# Patient Record
Sex: Male | Born: 1950
Health system: Southern US, Community
[De-identification: ages and names within clinical notes are randomized; demographics above are authoritative.]

## PROBLEM LIST (undated history)

## (undated) DIAGNOSIS — E785 Hyperlipidemia, unspecified: Secondary | ICD-10-CM

## (undated) DIAGNOSIS — T7840XA Allergy, unspecified, initial encounter: Secondary | ICD-10-CM

## (undated) DIAGNOSIS — R519 Headache, unspecified: Secondary | ICD-10-CM

## (undated) HISTORY — DX: Headache, unspecified: R51.9

## (undated) HISTORY — PX: BLADDER SURGERY: SHX569

## (undated) HISTORY — PX: OTHER SURGICAL HISTORY: SHX169

## (undated) HISTORY — DX: Allergy, unspecified, initial encounter: T78.40XA

## (undated) HISTORY — PX: NASAL FRACTURE SURGERY: SHX718

## (undated) HISTORY — DX: Hyperlipidemia, unspecified: E78.5

---

## 2019-01-18 ENCOUNTER — Encounter: Payer: Self-pay | Admitting: Family Medicine

## 2019-01-18 ENCOUNTER — Other Ambulatory Visit: Payer: Self-pay

## 2019-01-18 ENCOUNTER — Ambulatory Visit (INDEPENDENT_AMBULATORY_CARE_PROVIDER_SITE_OTHER): Payer: Medicare Other | Admitting: Family Medicine

## 2019-01-18 VITALS — BP 122/80 | HR 67 | Temp 98.1°F | Ht 70.0 in | Wt 194.2 lb

## 2019-01-18 DIAGNOSIS — R519 Headache, unspecified: Secondary | ICD-10-CM | POA: Diagnosis not present

## 2019-01-18 DIAGNOSIS — R7989 Other specified abnormal findings of blood chemistry: Secondary | ICD-10-CM | POA: Diagnosis not present

## 2019-01-18 DIAGNOSIS — Z13220 Encounter for screening for lipoid disorders: Secondary | ICD-10-CM | POA: Diagnosis not present

## 2019-01-18 DIAGNOSIS — G4700 Insomnia, unspecified: Secondary | ICD-10-CM | POA: Insufficient documentation

## 2019-01-18 NOTE — Patient Instructions (Signed)
It was very nice to see you today!  We will check blood work.  We will also get a CT scan of your sinuses.  Please continue taking Claritin as needed.  Please let me know if you would like to try sleep medication in the future.  Take care, Dr Jerline Pain  Please try these tips to maintain a healthy lifestyle:   Eat at least 3 REAL meals and 1-2 snacks per day.  Aim for no more than 5 hours between eating.  If you eat breakfast, please do so within one hour of getting up.    Obtain twice as many fruits/vegetables as protein or carbohydrate foods for both lunch and dinner. (Half of each meal should be fruits/vegetables, one quarter protein, and one quarter starchy carbs)   Cut down on sweet beverages. This includes juice, soda, and sweet tea.    Exercise at least 150 minutes every week.

## 2019-01-18 NOTE — Assessment & Plan Note (Signed)
Chronic problem.  New to provider.  Stable.  Discussed behavioral modifications.  Symptoms are not particularly bothersome at this point.  He does not wish to start any other medications.  We will continue with conservative management.  We consider trial of hydroxyzine in the future if has difficulty with excessive daytime somnolence.

## 2019-01-18 NOTE — Progress Notes (Signed)
Chief Complaint:  Franklin Rivera is a 68 y.o. male who presents today with a chief complaint of headache and to establish care.   Assessment/Plan:  Frontal headache Chronic problem.  New to provider.  Stable.  No red flags.  Given history of nasal and skull fracture, will check CT maxillofacial to rule out sinus pathology.  He can continue using Claritin in the meantime.  Insomnia Chronic problem.  New to provider.  Stable.  Discussed behavioral modifications.  Symptoms are not particularly bothersome at this point.  He does not wish to start any other medications.  We will continue with conservative management.  We consider trial of hydroxyzine in the future if has difficulty with excessive daytime somnolence.  Preventative Healthcare Check lipid panel.    Subjective:  HPI:  Headaches Symptoms started about a year ago.  He thinks it is mostly due to allergies and sinus.  Seems to be located in the frontal aspect of his head.  Patient does have remote history of a skull fracture and nasal fracture when he was younger.  He notices that whenever he takes Claritin symptoms seem to improve or resolve completely.  Symptoms usually occur once per day.  Does not have any associated weakness or numbness.  Occasionally takes Tylenol or Aleve which only temporarily alleviates his pain.  No blurred vision.  No reported fevers or chills.  No nausea or vomiting.  No other treatments tried.  No other obvious aggravating or alleviating factors.  Patient is also has some difficulty sleeping and staying asleep.  This is going on for several years.  Thinks he has been like this for most of his life.  Patient states he only sleeps 4 to 5 hours per night.  This does not seem to be bothering him.  He is able to accomplish things he wants to throughout the day.  Does not feel excessively sleepy.  ROS: Per HPI, otherwise a complete review of systems was negative.   PMH:  The following were reviewed and  entered/updated in epic: Past Medical History:  Diagnosis Date  . Allergy   . Headache    Patient Active Problem List   Diagnosis Date Noted  . Frontal headache 01/18/2019  . Insomnia 01/18/2019   Past Surgical History:  Procedure Laterality Date  . BLADDER SURGERY    . NASAL FRACTURE SURGERY    . Skull Surgery      Family History  Problem Relation Age of Onset  . Cancer Neg Hx     Medications- reviewed and updated No current outpatient medications on file.   No current facility-administered medications for this visit.     Allergies-reviewed and updated No Known Allergies  Social History   Socioeconomic History  . Marital status: Married    Spouse name: Not on file  . Number of children: Not on file  . Years of education: Not on file  . Highest education level: Not on file  Occupational History  . Not on file  Social Needs  . Financial resource strain: Not on file  . Food insecurity    Worry: Not on file    Inability: Not on file  . Transportation needs    Medical: Not on file    Non-medical: Not on file  Tobacco Use  . Smoking status: Never Smoker  . Smokeless tobacco: Never Used  Substance and Sexual Activity  . Alcohol use: Never    Frequency: Never  . Drug use: Not Currently  .  Sexual activity: Not on file  Lifestyle  . Physical activity    Days per week: Not on file    Minutes per session: Not on file  . Stress: Not on file  Relationships  . Social Herbalist on phone: Not on file    Gets together: Not on file    Attends religious service: Not on file    Active member of club or organization: Not on file    Attends meetings of clubs or organizations: Not on file    Relationship status: Not on file  Other Topics Concern  . Not on file  Social History Narrative  . Not on file          Objective:  Physical Exam: BP 122/80   Pulse 67   Temp 98.1 F (36.7 C)   Ht 5\' 10"  (1.778 m)   Wt 194 lb 4 oz (88.1 kg)   SpO2 97%    BMI 27.87 kg/m   Gen: NAD, resting comfortably CV: Regular rate and rhythm with no murmurs appreciated Pulm: Normal work of breathing, clear to auscultation bilaterally with no crackles, wheezes, or rhonchi GI: Normal bowel sounds present. Soft, Nontender, Nondistended. MSK: No edema, cyanosis, or clubbing noted Skin: Warm, dry Neuro: Grossly normal, moves all extremities Psych: Normal affect and thought content      Caleb M. Jerline Pain, MD 01/18/2019 3:49 PM

## 2019-01-18 NOTE — Assessment & Plan Note (Signed)
Chronic problem.  New to provider.  Stable.  No red flags.  Given history of nasal and skull fracture, will check CT maxillofacial to rule out sinus pathology.  He can continue using Claritin in the meantime.

## 2019-01-19 LAB — COMPREHENSIVE METABOLIC PANEL
ALT: 21 U/L (ref 0–53)
AST: 23 U/L (ref 0–37)
Albumin: 4.1 g/dL (ref 3.5–5.2)
Alkaline Phosphatase: 74 U/L (ref 39–117)
BUN: 14 mg/dL (ref 6–23)
CO2: 30 mEq/L (ref 19–32)
Calcium: 9.3 mg/dL (ref 8.4–10.5)
Chloride: 102 mEq/L (ref 96–112)
Creatinine, Ser: 0.94 mg/dL (ref 0.40–1.50)
GFR: 79.91 mL/min (ref >60.00)
Glucose, Bld: 76 mg/dL (ref 70–99)
Potassium: 4.3 mEq/L (ref 3.5–5.1)
Sodium: 139 mEq/L (ref 135–145)
Total Bilirubin: 0.5 mg/dL (ref 0.2–1.2)
Total Protein: 7.5 g/dL (ref 6.0–8.3)

## 2019-01-19 LAB — CBC
HCT: 44.2 % (ref 39.0–52.0)
Hemoglobin: 14.7 g/dL (ref 13.0–17.0)
MCHC: 33.2 g/dL (ref 30.0–36.0)
MCV: 87.2 fl (ref 78.0–100.0)
Platelets: 201 10*3/uL (ref 150.0–400.0)
RBC: 5.07 Mil/uL (ref 4.22–5.81)
RDW: 13.1 % (ref 11.5–15.5)
WBC: 4.5 10*3/uL (ref 4.0–10.5)

## 2019-01-19 LAB — LIPID PANEL
Cholesterol: 225 mg/dL — ABNORMAL HIGH (ref 0–200)
HDL: 46.8 mg/dL (ref >39.00)
LDL Cholesterol: 160 mg/dL — ABNORMAL HIGH (ref 0–99)
NonHDL: 177.71
Total CHOL/HDL Ratio: 5
Triglycerides: 89 mg/dL (ref 0.0–149.0)
VLDL: 17.8 mg/dL (ref 0.0–40.0)

## 2019-01-20 ENCOUNTER — Encounter: Payer: Self-pay | Admitting: Family Medicine

## 2019-01-20 DIAGNOSIS — E785 Hyperlipidemia, unspecified: Secondary | ICD-10-CM | POA: Insufficient documentation

## 2019-01-20 NOTE — Progress Notes (Signed)
Please inform patient of the following:  Cholesterol was elevated, but everything else was NORMAL. Recommend starting lipitor 40mg  daily to improve numbers and lower risk of heart attack and stroke. Please send in if patient is willing to start. Regardless, he should continue working on diet and exercise and we can recheck in a year.   Franklin Rivera. Jerline Pain, MD 01/20/2019 10:09 AM

## 2019-01-24 ENCOUNTER — Other Ambulatory Visit: Payer: Self-pay

## 2019-01-24 MED ORDER — ATORVASTATIN CALCIUM 40 MG PO TABS: 40.0000 mg | ORAL_TABLET | Freq: Every day | ORAL | 3 refills | Status: DC

## 2019-02-04 ENCOUNTER — Ambulatory Visit (HOSPITAL_COMMUNITY)
Admission: RE | Admit: 2019-02-04 | Discharge: 2019-02-04 | Disposition: A | Discharge status: Home / Self Care | Payer: Medicare Other | Source: Ambulatory Visit | Attending: Family Medicine | Admitting: Family Medicine

## 2019-02-04 ENCOUNTER — Other Ambulatory Visit: Payer: Self-pay

## 2019-02-04 DIAGNOSIS — R519 Headache, unspecified: Secondary | ICD-10-CM | POA: Insufficient documentation

## 2019-02-07 ENCOUNTER — Other Ambulatory Visit: Payer: Self-pay

## 2019-02-07 NOTE — Progress Notes (Signed)
Please inform patient of the following:  CT scan shows that he does have congestion in his sinuses. I think this could be the source of his headaches. I would like to start prednisone 50mg  x 5 days and augmentin twice daily for 10 days. Please send both in for patient and I would like for him to let us know if symptoms do not improve.  Franklin Rivera. Jerline Pain, MD 02/07/2019 8:28 AM

## 2019-02-08 ENCOUNTER — Other Ambulatory Visit: Payer: Self-pay | Admitting: *Deleted

## 2019-02-08 MED ORDER — AMOXICILLIN-POT CLAVULANATE 875-125 MG PO TABS: 1.0000 | ORAL_TABLET | Freq: Two times a day (BID) | ORAL | 0 refills | Status: DC

## 2019-02-08 MED ORDER — PREDNISONE 50 MG PO TABS: ORAL_TABLET | ORAL | 0 refills | Status: DC

## 2019-04-20 ENCOUNTER — Other Ambulatory Visit: Payer: Self-pay

## 2019-04-21 ENCOUNTER — Telehealth: Payer: Self-pay

## 2019-04-21 ENCOUNTER — Ambulatory Visit (INDEPENDENT_AMBULATORY_CARE_PROVIDER_SITE_OTHER): Payer: Medicare HMO | Admitting: Family Medicine

## 2019-04-21 ENCOUNTER — Encounter: Payer: Self-pay | Admitting: Family Medicine

## 2019-04-21 VITALS — BP 128/80 | HR 87 | Temp 98.2°F | Ht 70.0 in | Wt 212.2 lb

## 2019-04-21 DIAGNOSIS — R519 Headache, unspecified: Secondary | ICD-10-CM | POA: Diagnosis not present

## 2019-04-21 DIAGNOSIS — J329 Chronic sinusitis, unspecified: Secondary | ICD-10-CM

## 2019-04-21 MED ORDER — PREDNISONE 50 MG PO TABS: ORAL_TABLET | ORAL | 0 refills | Status: DC

## 2019-04-21 MED ORDER — AMOXICILLIN-POT CLAVULANATE 875-125 MG PO TABS: 1.0000 | ORAL_TABLET | Freq: Two times a day (BID) | ORAL | 0 refills | Status: DC

## 2019-04-21 NOTE — Patient Instructions (Signed)
It was very nice to see you today!  Please restart your medications.  I will place a referral for you to see an ear nose and throat doctor.  Take care, Dr Jerline Pain  Please try these tips to maintain a healthy lifestyle:   Eat at least 3 REAL meals and 1-2 snacks per day.  Aim for no more than 5 hours between eating.  If you eat breakfast, please do so within one hour of getting up.    Each meal should contain half fruits/vegetables, one quarter protein, and one quarter carbs (no bigger than a computer mouse)   Cut down on sweet beverages. This includes juice, soda, and sweet tea.     Drink at least 1 glass of water with each meal and aim for at least 8 glasses per day   Exercise at least 150 minutes every week.

## 2019-04-21 NOTE — Progress Notes (Signed)
   Franklin Rivera is a 69 y.o. male who presents today for an office visit.  Assessment/Plan:  Chronic Problems Addressed Today: Chronic sinusitis Worsened.  We will start another course of Augmentin and prednisone.  Place referral to ENT.     Subjective:  HPI:  Patient with chronic sinusitis.  He was seen few months ago and had maxillofacial CT scan done which showed opacification of left sphenoid sinus.  Started on a course of prednisone and Augmentin.  Symptoms improved for a few weeks however it returned.  He has been try to take Claritin with modest improvement.  Still has nearly daily headache.  No nausea or vomiting.  No vision changes.  No weakness or numbness.       Objective:  Physical Exam: BP 128/80   Pulse 87   Temp 98.2 F (36.8 C)   Ht 5\' 10"  (1.778 m)   Wt 212 lb 3.2 oz (96.3 kg)   SpO2 98%   BMI 30.45 kg/m   Gen: No acute distress, resting comfortably CV: Regular rate and rhythm with no murmurs appreciated Pulm: Normal work of breathing, clear to auscultation bilaterally with no crackles, wheezes, or rhonchi Neuro: Grossly normal, moves all extremities Psych: Normal affect and thought content      Tomi Grandpre M. Jerline Pain, MD 04/21/2019 11:10 AM

## 2019-04-21 NOTE — Assessment & Plan Note (Signed)
Worsened.  We will start another course of Augmentin and prednisone.  Place referral to ENT.

## 2019-04-21 NOTE — Telephone Encounter (Signed)
Patient calling to see if medication was sent in   [facility] [place] [address] Phone:  [phone]  Fax:  [phone]     for amoxicillin-clavulanate (AUGMENTIN) 875-125 MG tablet

## 2019-04-22 DIAGNOSIS — H1045 Other chronic allergic conjunctivitis: Secondary | ICD-10-CM | POA: Diagnosis not present

## 2019-04-22 DIAGNOSIS — H0288B Meibomian gland dysfunction left eye, upper and lower eyelids: Secondary | ICD-10-CM | POA: Diagnosis not present

## 2019-04-22 DIAGNOSIS — H02423 Myogenic ptosis of bilateral eyelids: Secondary | ICD-10-CM | POA: Diagnosis not present

## 2019-04-22 DIAGNOSIS — H3562 Retinal hemorrhage, left eye: Secondary | ICD-10-CM | POA: Diagnosis not present

## 2019-04-22 DIAGNOSIS — H2513 Age-related nuclear cataract, bilateral: Secondary | ICD-10-CM | POA: Diagnosis not present

## 2019-04-22 DIAGNOSIS — H0288A Meibomian gland dysfunction right eye, upper and lower eyelids: Secondary | ICD-10-CM | POA: Diagnosis not present

## 2019-05-03 ENCOUNTER — Ambulatory Visit (INDEPENDENT_AMBULATORY_CARE_PROVIDER_SITE_OTHER): Payer: Medicare HMO | Admitting: Otolaryngology

## 2019-05-03 ENCOUNTER — Other Ambulatory Visit: Payer: Self-pay

## 2019-05-03 ENCOUNTER — Encounter (INDEPENDENT_AMBULATORY_CARE_PROVIDER_SITE_OTHER): Payer: Self-pay | Admitting: Otolaryngology

## 2019-05-03 VITALS — Temp 97.5°F

## 2019-05-03 DIAGNOSIS — R519 Headache, unspecified: Secondary | ICD-10-CM

## 2019-05-03 DIAGNOSIS — G8929 Other chronic pain: Secondary | ICD-10-CM | POA: Diagnosis not present

## 2019-05-03 DIAGNOSIS — J323 Chronic sphenoidal sinusitis: Secondary | ICD-10-CM | POA: Diagnosis not present

## 2019-05-03 NOTE — Progress Notes (Addendum)
HPI: Franklin Rivera is a 69 y.o. male who presents is referred by Dr. Jerline Pain for evaluation of chronic headaches that he has had for years.  The headaches are generally on top of his head or in the back of his head.  Sometimes the headaches persist all day and sometimes they just come and go.  He has history of sinus problems and takes Claritin regularly.  The headaches seem to occur more often when he is off the Claritin.[name]  He had a CT scan performed of the sinuses in November of last year that demonstrated an opacified left sphenoid sinus.  I reviewed this scan and this showed a completely opacified left sphenoid sinus but no changes in the bony walls.  The remaining sinuses were all clear.  He had a moderate septal deviation. He is having no colored discharge from his nose. He has previously been treated with antibiotics and steroids which seemed to help a little bit.  Past Medical History:  Diagnosis Date  . Allergy   . Headache    Past Surgical History:  Procedure Laterality Date  . BLADDER SURGERY    . NASAL FRACTURE SURGERY    . Skull Surgery     Social History   Socioeconomic History  . Marital status: Married    Spouse name: Not on file  . Number of children: Not on file  . Years of education: Not on file  . Highest education level: Not on file  Occupational History  . Not on file  Tobacco Use  . Smoking status: Never Smoker  . Smokeless tobacco: Never Used  Substance and Sexual Activity  . Alcohol use: Never  . Drug use: Not Currently  . Sexual activity: Not on file  Other Topics Concern  . Not on file  Social History Narrative  . Not on file   Social Determinants of Health   Financial Resource Strain:   . Difficulty of Paying Living Expenses: Not on file  Food Insecurity:   . Worried About Charity fundraiser in the Last Year: Not on file  . Ran Out of Food in the Last Year: Not on file  Transportation Needs:   . Lack of Transportation (Medical): Not on file   . Lack of Transportation (Non-Medical): Not on file  Physical Activity:   . Days of Exercise per Week: Not on file  . Minutes of Exercise per Session: Not on file  Stress:   . Feeling of Stress : Not on file  Social Connections:   . Frequency of Communication with Friends and Family: Not on file  . Frequency of Social Gatherings with Friends and Family: Not on file  . Attends Religious Services: Not on file  . Active Member of Clubs or Organizations: Not on file  . Attends Archivist Meetings: Not on file  . Marital Status: Not on file   Family History  Problem Relation Age of Onset  . Cancer Neg Hx    No Known Allergies Prior to Admission medications   Medication Sig Start Date End Date Taking? Authorizing Provider  amoxicillin-clavulanate (AUGMENTIN) 875-125 MG tablet Take 1 tablet by mouth 2 (two) times daily. 04/21/19  Yes [name], MD  atorvastatin (LIPITOR) 40 MG tablet Take 1 tablet (40 mg total) by mouth daily. 01/24/19  Yes [name], MD  predniSONE (DELTASONE) 50 MG tablet One tablet daily x 5 days. 04/21/19  Yes [name], MD     Positive ROS:  Otherwise negative  All other systems have been reviewed and were otherwise negative with the exception of those mentioned in the HPI and as above.  Physical Exam: Constitutional: Alert, well-appearing, no acute distress Ears: External ears without lesions or tenderness. Ear canals are clear bilaterally with intact, clear TMs.  Nasal: External nose without lesions. Septum is mildly deviated.  No obvious mucopurulent discharge noted.. Nasal endoscopy was performed and on nasal endoscopy the right sphenoid ostia is widely patent.  The left sphenoid ostia is occluded but I do not appreciate any mucopurulent discharge from the sphenoid ostia. Oral: Lips and gums without lesions. Tongue and palate mucosa without lesions. Posterior oropharynx clear. Neck: No palpable adenopathy or masses Respiratory:  Breathing comfortably  Skin: No facial/neck lesions or rash noted.  Nasal/sinus endoscopy  Date/Time: 05/03/2019 6:22 PM Performed by: Rozetta Nunnery, MD Authorized by: Rozetta Nunnery, MD   Consent:    Consent obtained:  Verbal   Consent given by:  Patient   Risks discussed:  Pain Procedure details:    Indications: sino-nasal symptoms     Medication:  Afrin   Scope location: bilateral nare   Sinus:    Right middle meatus: normal     Left middle meatus: normal     Right nasopharynx: normal     Left nasopharynx: normal   Comments:     On nasal endoscopy of the sphenoid ostia the right sphenoid ostia is widely patent and clear.  The left sphenoid ostia is obstructed but I do not notice any obvious mucopurulent discharge from the sphenoid ostia.    Assessment: Chronic left sphenoid obstruction. Chronic headaches for years.  Plan: I will plan on treating him with antibiotics Augmentin which he is taken previously 875 mg twice daily for 2 weeks in addition to regular use of nasal steroid spray Nasacort 2 sprays each nostril at night. If he is continuing to have headaches in 3-4 weeks he will call us back to schedule repeat CT scan to see if the opacity of left sphenoid sinus persists.  If it persists could consider opening the sphenoid sinus surgically which should not be too difficult to see if this resolves the headaches.   Radene Journey, MD   CC:

## 2019-05-20 ENCOUNTER — Other Ambulatory Visit: Payer: Self-pay

## 2019-05-23 ENCOUNTER — Encounter: Payer: Self-pay | Admitting: Family Medicine

## 2019-05-23 ENCOUNTER — Ambulatory Visit (INDEPENDENT_AMBULATORY_CARE_PROVIDER_SITE_OTHER): Payer: Medicare HMO | Admitting: Family Medicine

## 2019-05-23 ENCOUNTER — Ambulatory Visit: Payer: Medicare HMO

## 2019-05-23 ENCOUNTER — Other Ambulatory Visit: Payer: Self-pay

## 2019-05-23 VITALS — BP 118/72 | HR 77 | Temp 97.3°F | Ht 72.0 in | Wt 209.0 lb

## 2019-05-23 DIAGNOSIS — Z1159 Encounter for screening for other viral diseases: Secondary | ICD-10-CM

## 2019-05-23 DIAGNOSIS — Z0001 Encounter for general adult medical examination with abnormal findings: Secondary | ICD-10-CM

## 2019-05-23 DIAGNOSIS — Z6828 Body mass index (BMI) 28.0-28.9, adult: Secondary | ICD-10-CM

## 2019-05-23 DIAGNOSIS — E663 Overweight: Secondary | ICD-10-CM

## 2019-05-23 DIAGNOSIS — N529 Male erectile dysfunction, unspecified: Secondary | ICD-10-CM

## 2019-05-23 DIAGNOSIS — G4700 Insomnia, unspecified: Secondary | ICD-10-CM | POA: Diagnosis not present

## 2019-05-23 DIAGNOSIS — Z125 Encounter for screening for malignant neoplasm of prostate: Secondary | ICD-10-CM

## 2019-05-23 DIAGNOSIS — Z1211 Encounter for screening for malignant neoplasm of colon: Secondary | ICD-10-CM | POA: Diagnosis not present

## 2019-05-23 DIAGNOSIS — E785 Hyperlipidemia, unspecified: Secondary | ICD-10-CM | POA: Diagnosis not present

## 2019-05-23 DIAGNOSIS — J329 Chronic sinusitis, unspecified: Secondary | ICD-10-CM | POA: Diagnosis not present

## 2019-05-23 LAB — LIPID PANEL
Cholesterol: 177 mg/dL (ref 0–200)
HDL: 41.7 mg/dL (ref >39.00)
LDL Cholesterol: 103 mg/dL — ABNORMAL HIGH (ref 0–99)
NonHDL: 135.18
Total CHOL/HDL Ratio: 4
Triglycerides: 160 mg/dL — ABNORMAL HIGH (ref 0.0–149.0)
VLDL: 32 mg/dL (ref 0.0–40.0)

## 2019-05-23 LAB — CBC
HCT: 47.3 % (ref 39.0–52.0)
Hemoglobin: 15.6 g/dL (ref 13.0–17.0)
MCHC: 32.9 g/dL (ref 30.0–36.0)
MCV: 87.6 fl (ref 78.0–100.0)
Platelets: 163 10*3/uL (ref 150.0–400.0)
RBC: 5.4 Mil/uL (ref 4.22–5.81)
RDW: 13.1 % (ref 11.5–15.5)
WBC: 3.3 10*3/uL — ABNORMAL LOW (ref 4.0–10.5)

## 2019-05-23 LAB — COMPREHENSIVE METABOLIC PANEL
ALT: 43 U/L (ref 0–53)
AST: 33 U/L (ref 0–37)
Albumin: 4.2 g/dL (ref 3.5–5.2)
Alkaline Phosphatase: 88 U/L (ref 39–117)
BUN: 11 mg/dL (ref 6–23)
CO2: 27 mEq/L (ref 19–32)
Calcium: 9.5 mg/dL (ref 8.4–10.5)
Chloride: 103 mEq/L (ref 96–112)
Creatinine, Ser: 0.82 mg/dL (ref 0.40–1.50)
GFR: 113.08 mL/min (ref >60.00)
Glucose, Bld: 108 mg/dL — ABNORMAL HIGH (ref 70–99)
Potassium: 4.4 mEq/L (ref 3.5–5.1)
Sodium: 141 mEq/L (ref 135–145)
Total Bilirubin: 0.4 mg/dL (ref 0.2–1.2)
Total Protein: 7.7 g/dL (ref 6.0–8.3)

## 2019-05-23 LAB — PSA: PSA: 1.04 ng/mL (ref 0.10–4.00)

## 2019-05-23 LAB — TSH: TSH: 0.7 u[IU]/mL (ref 0.35–4.50)

## 2019-05-23 MED ORDER — TADALAFIL 10 MG PO TABS: 10.0000 mg | ORAL_TABLET | Freq: Every day | ORAL | 0 refills | Status: DC | PRN

## 2019-05-23 MED ORDER — ATORVASTATIN CALCIUM 40 MG PO TABS: 40.0000 mg | ORAL_TABLET | Freq: Every day | ORAL | 3 refills | Status: DC

## 2019-05-23 NOTE — Assessment & Plan Note (Signed)
Continue management per ENT. 

## 2019-05-23 NOTE — Progress Notes (Signed)
Chief Complaint:  Franklin Rivera is a 69 y.o. male who presents today for his annual comprehensive physical exam.    Assessment/Plan:  Chronic Problems Addressed Today: Erectile dysfunction Start Cialis 10 mg daily as needed.  Discussed potential side effects.  Chronic sinusitis Continue management per ENT.  Dyslipidemia Continue atorvastatin 40 mg daily.  Will check lipid panel, CBC, C met, TSH today.  Insomnia Symptoms are stable.  Does not want pharmacologic therapy at this point.  He is managing by napping during the day.   Body mass index is 28.35 kg/m. / Overweight BMI Metric Follow Up - 05/23/19 0910      BMI Metric Follow Up-Please document annually   BMI Metric Follow Up  Education provided        Preventative Healthcare: Check CBC, C met, TSH, lipid panel, PSA, and hep C screen.  We will set up for Cologuard testing.  Patient Counseling(The following topics were reviewed and/or handout was given):  -Nutrition: Stressed importance of moderation in sodium/caffeine intake, saturated fat and cholesterol, caloric balance, sufficient intake of fresh fruits, vegetables, and fiber.  -Stressed the importance of regular exercise.   -Substance Abuse: Discussed cessation/primary prevention of tobacco, alcohol, or other drug use; driving or other dangerous activities under the influence; availability of treatment for abuse.   -Injury prevention: Discussed safety belts, safety helmets, smoke detector, smoking near bedding or upholstery.   -Sexuality: Discussed sexually transmitted diseases, partner selection, use of condoms, avoidance of unintended pregnancy and contraceptive alternatives.   -Dental health: Discussed importance of regular tooth brushing, flossing, and dental visits.  -Health maintenance and immunizations reviewed. Please refer to Health maintenance section.  Return to care in 1 year for next preventative visit.     Subjective:  HPI:  He has no acute  complaints today.   Some erectile issues for several years. Getting worse. Nothing tried.   Still having ongoing issues with difficulty sleeping but they are not particularly bothersome and he manages by napping during the day.    Lifestyle Diet: Balanced. Plenty of fruits and vegetables.  Exercise: Daily.   Depression screen [facility] 2/9 05/23/2019  Decreased Interest 0  Down, Depressed, Hopeless 0  PHQ - 2 Score 0  Altered sleeping -  Tired, decreased energy -  Change in appetite -  Feeling bad or failure about yourself  -  Trouble concentrating -  Moving slowly or fidgety/restless -  Suicidal thoughts -  PHQ-9 Score -  Difficult doing work/chores -    Health Maintenance Due  Topic Date Due  . Hepatitis C Screening  11-19-1950     ROS: Per HPI, otherwise a complete review of systems was negative.   PMH:  The following were reviewed and entered/updated in epic: Past Medical History:  Diagnosis Date  . Allergy   . Headache    Patient Active Problem List   Diagnosis Date Noted  . Erectile dysfunction 05/23/2019  . Chronic sinusitis 04/21/2019  . Dyslipidemia 01/20/2019  . Frontal headache 01/18/2019  . Insomnia 01/18/2019   Past Surgical History:  Procedure Laterality Date  . BLADDER SURGERY    . NASAL FRACTURE SURGERY    . Skull Surgery      Family History  Problem Relation Age of Onset  . Cancer Neg Hx     Medications- reviewed and updated Current Outpatient Medications  Medication Sig Dispense Refill  . atorvastatin (LIPITOR) 40 MG tablet Take 1 tablet (40 mg total) by mouth daily. 90 tablet 3  .  tadalafil (CIALIS) 10 MG tablet Take 1 tablet (10 mg total) by mouth daily as needed for erectile dysfunction. 30 tablet 0   No current facility-administered medications for this visit.    Allergies-reviewed and updated No Known Allergies  Social History   Socioeconomic History  . Marital status: Married    Spouse name: Not on file  . Number of  children: Not on file  . Years of education: Not on file  . Highest education level: Not on file  Occupational History  . Not on file  Tobacco Use  . Smoking status: Never Smoker  . Smokeless tobacco: Never Used  Substance and Sexual Activity  . Alcohol use: Never  . Drug use: Not Currently  . Sexual activity: Not on file  Other Topics Concern  . Not on file  Social History Narrative  . Not on file   Social Determinants of Health   Financial Resource Strain:   . Difficulty of Paying Living Expenses: Not on file  Food Insecurity:   . Worried About Charity fundraiser in the Last Year: Not on file  . Ran Out of Food in the Last Year: Not on file  Transportation Needs:   . Lack of Transportation (Medical): Not on file  . Lack of Transportation (Non-Medical): Not on file  Physical Activity:   . Days of Exercise per Week: Not on file  . Minutes of Exercise per Session: Not on file  Stress:   . Feeling of Stress : Not on file  Social Connections:   . Frequency of Communication with Friends and Family: Not on file  . Frequency of Social Gatherings with Friends and Family: Not on file  . Attends Religious Services: Not on file  . Active Member of Clubs or Organizations: Not on file  . Attends Archivist Meetings: Not on file  . Marital Status: Not on file        Objective:  Physical Exam: BP 118/72   Pulse 77   Temp (!) 97.3 F (36.3 C)   Ht 6' (1.829 m)   Wt 209 lb (94.8 kg)   SpO2 95%   BMI 28.35 kg/m   Body mass index is 28.35 kg/m. Wt Readings from Last 3 Encounters:  05/23/19 209 lb (94.8 kg)  04/21/19 212 lb 3.2 oz (96.3 kg)  01/18/19 194 lb 4 oz (88.1 kg)   Gen: NAD, resting comfortably HEENT: TMs normal bilaterally. OP clear. No thyromegaly noted.  CV: RRR with no murmurs appreciated Pulm: NWOB, CTAB with no crackles, wheezes, or rhonchi GI: Normal bowel sounds present. Soft, Nontender, Nondistended. MSK: no edema, cyanosis, or clubbing  noted Skin: warm, dry Neuro: CN2-12 grossly intact. Strength 5/5 in upper and lower extremities. Reflexes symmetric and intact bilaterally.  Psych: Normal affect and thought content     Toribio Seiber M. Jerline Pain, MD 05/23/2019 9:11 AM

## 2019-05-23 NOTE — Patient Instructions (Signed)
It was very nice to see you today!  We will check blood work today.  We will get you set up to have Cologuard done to screen for colon cancer.  Come back in 1 year for your next checkup with blood work, or sooner if needed.  Take care, Dr Jerline Pain  Please try these tips to maintain a healthy lifestyle:   Eat at least 3 REAL meals and 1-2 snacks per day.  Aim for no more than 5 hours between eating.  If you eat breakfast, please do so within one hour of getting up.    Each meal should contain half fruits/vegetables, one quarter protein, and one quarter carbs (no bigger than a computer mouse)   Cut down on sweet beverages. This includes juice, soda, and sweet tea.     Drink at least 1 glass of water with each meal and aim for at least 8 glasses per day   Exercise at least 150 minutes every week.    Preventive Care 46 Years and Older, Male Preventive care refers to lifestyle choices and visits with your health care provider that can promote health and wellness. This includes:  A yearly physical exam. This is also called an annual well check.  Regular dental and eye exams.  Immunizations.  Screening for certain conditions.  Healthy lifestyle choices, such as diet and exercise. What can I expect for my preventive care visit? Physical exam Your health care provider will check:  Height and weight. These may be used to calculate body mass index (BMI), which is a measurement that tells if you are at a healthy weight.  Heart rate and blood pressure.  Your skin for abnormal spots. Counseling Your health care provider may ask you questions about:  Alcohol, tobacco, and drug use.  Emotional well-being.  Home and relationship well-being.  Sexual activity.  Eating habits.  History of falls.  Memory and ability to understand (cognition).  Work and work Statistician. What immunizations do I need?  Influenza (flu) vaccine  This is recommended every year. Tetanus,  diphtheria, and pertussis (Tdap) vaccine  You may need a Td booster every 10 years. Varicella (chickenpox) vaccine  You may need this vaccine if you have not already been vaccinated. Zoster (shingles) vaccine  You may need this after age 24. Pneumococcal conjugate (PCV13) vaccine  One dose is recommended after age 70. Pneumococcal polysaccharide (PPSV23) vaccine  One dose is recommended after age 76. Measles, mumps, and rubella (MMR) vaccine  You may need at least one dose of MMR if you were born in 1957 or later. You may also need a second dose. Meningococcal conjugate (MenACWY) vaccine  You may need this if you have certain conditions. Hepatitis A vaccine  You may need this if you have certain conditions or if you travel or work in places where you may be exposed to hepatitis A. Hepatitis B vaccine  You may need this if you have certain conditions or if you travel or work in places where you may be exposed to hepatitis B. Haemophilus influenzae type b (Hib) vaccine  You may need this if you have certain conditions. You may receive vaccines as individual doses or as more than one vaccine together in one shot (combination vaccines). Talk with your health care provider about the risks and benefits of combination vaccines. What tests do I need? Blood tests  Lipid and cholesterol levels. These may be checked every 5 years, or more frequently depending on your overall health.  Hepatitis  C test.  Hepatitis B test. Screening  Lung cancer screening. You may have this screening every year starting at age 68 if you have a 30-pack-year history of smoking and currently smoke or have quit within the past 15 years.  Colorectal cancer screening. All adults should have this screening starting at age 71 and continuing until age 57. Your health care provider may recommend screening at age 23 if you are at increased risk. You will have tests every 1-10 years, depending on your results and  the type of screening test.  Prostate cancer screening. Recommendations will vary depending on your family history and other risks.  Diabetes screening. This is done by checking your blood sugar (glucose) after you have not eaten for a while (fasting). You may have this done every 1-3 years.  Abdominal aortic aneurysm (AAA) screening. You may need this if you are a current or former smoker.  Sexually transmitted disease (STD) testing. Follow these instructions at home: Eating and drinking  Eat a diet that includes fresh fruits and vegetables, whole grains, lean protein, and low-fat dairy products. Limit your intake of foods with high amounts of sugar, saturated fats, and salt.  Take vitamin and mineral supplements as recommended by your health care provider.  Do not drink alcohol if your health care provider tells you not to drink.  If you drink alcohol: ? Limit how much you have to 0-2 drinks a day. ? Be aware of how much alcohol is in your drink. In the [place] one drink equals one 12 oz bottle of beer (355 mL), one 5 oz glass of wine (148 mL), or one 1 oz glass of hard liquor (44 mL). Lifestyle  Take daily care of your teeth and gums.  Stay active. Exercise for at least 30 minutes on 5 or more days each week.  Do not use any products that contain nicotine or tobacco, such as cigarettes, e-cigarettes, and chewing tobacco. If you need help quitting, ask your health care provider.  If you are sexually active, practice safe sex. Use a condom or other form of protection to prevent STIs (sexually transmitted infections).  Talk with your health care provider about taking a low-dose aspirin or statin. What's next?  Visit your health care provider once a year for a well check visit.  Ask your health care provider how often you should have your eyes and teeth checked.  Stay up to date on all vaccines. This information is not intended to replace advice given to you by your health care  provider. Make sure you discuss any questions you have with your health care provider. Document Revised: 03/11/2018 Document Reviewed: 03/11/2018 Elsevier Patient Education  2020 [employer].

## 2019-05-23 NOTE — Assessment & Plan Note (Signed)
Symptoms are stable.  Does not want pharmacologic therapy at this point.  He is managing by napping during the day.

## 2019-05-23 NOTE — Assessment & Plan Note (Signed)
Continue atorvastatin 40 mg daily.  Will check lipid panel, CBC, C met, TSH today.

## 2019-05-23 NOTE — Assessment & Plan Note (Signed)
Start Cialis 10 mg daily as needed.  Discussed potential side effects.

## 2019-05-24 ENCOUNTER — Encounter: Payer: Self-pay | Admitting: Family Medicine

## 2019-05-24 DIAGNOSIS — R739 Hyperglycemia, unspecified: Secondary | ICD-10-CM | POA: Insufficient documentation

## 2019-05-24 NOTE — Progress Notes (Signed)
Please inform patient of the following:  Hepatitis C antibody is positive - this means he has been exposed to the virus at some point. We will be checking another level to see if he has any active infection.   Blood sugar mildly elevated but do not need to start meds. Cholesterol much better than last year. Would like for him to continue working on diet and exercise and we can recheck in a year.  All of his other blood work is NORMAL.  Team - can we call the lab to see if they are going to run a HCV RNA? If not we will need him to come into the office to have it done.

## 2019-05-29 LAB — HCV RNA,QUANTITATIVE REAL TIME PCR
HCV Quantitative Log: 7.12 Log IU/mL — ABNORMAL HIGH
HCV RNA, PCR, QN: 13200000 IU/mL — ABNORMAL HIGH

## 2019-05-29 LAB — HEPATITIS C ANTIBODY
Hepatitis C Ab: REACTIVE — AB
SIGNAL TO CUT-OFF: 29 — ABNORMAL HIGH (ref <1.00)

## 2019-05-30 ENCOUNTER — Other Ambulatory Visit: Payer: Self-pay

## 2019-05-30 DIAGNOSIS — B1920 Unspecified viral hepatitis C without hepatic coma: Secondary | ICD-10-CM

## 2019-05-30 NOTE — Progress Notes (Signed)
Please inform patient of the following:  BLood test shows that he has an active hepatitis C infection. Recommend referral to Infectious disease so that he can start treatment. Please place referral.  Drew Lips M. Jerline Pain, MD 05/30/2019 8:09 AM

## 2019-05-30 NOTE — Progress Notes (Signed)
amb  

## 2019-06-02 ENCOUNTER — Other Ambulatory Visit: Payer: Self-pay

## 2019-06-06 ENCOUNTER — Ambulatory Visit (INDEPENDENT_AMBULATORY_CARE_PROVIDER_SITE_OTHER): Payer: Medicare HMO | Admitting: Family Medicine

## 2019-06-06 ENCOUNTER — Other Ambulatory Visit: Payer: Self-pay

## 2019-06-06 VITALS — BP 122/62 | HR 69 | Temp 97.8°F | Ht 72.0 in | Wt 212.2 lb

## 2019-06-06 DIAGNOSIS — E785 Hyperlipidemia, unspecified: Secondary | ICD-10-CM | POA: Diagnosis not present

## 2019-06-06 DIAGNOSIS — B1920 Unspecified viral hepatitis C without hepatic coma: Secondary | ICD-10-CM | POA: Diagnosis not present

## 2019-06-06 NOTE — Assessment & Plan Note (Signed)
Last LDL 103.  Continue Lipitor 40 mg daily.  Recheck lipid panel in 6 to 12 months.

## 2019-06-06 NOTE — Progress Notes (Signed)
   Franklin Rivera is a 69 y.o. male who presents today for an office visit.  Assessment/Plan:  Chronic Problems Addressed Today: Hepatitis C virus infection without hepatic coma Recent transaminases within normal limits and has no signs of jaundice or cirrhosis.  He will follow-up with ID for further treatment.  Advised him that his wife should be checked for hepatitis C as well.  Dyslipidemia Last LDL 103.  Continue Lipitor 40 mg daily.  Recheck lipid panel in 6 to 12 months.    Subjective:  HPI:  Patient seen for his physical couple weeks ago.  Had hepatitis C screen performed which was positive.  Has referral pending to ID but has not yet scheduled an appointment with them yet.  Does not currently have any abdominal pain.  No skin discoloration.  No bowel changes.       Objective:  Physical Exam: BP 122/62   Pulse 69   Temp 97.8 F (36.6 C)   Ht 6' (1.829 m)   Wt 212 lb 3.2 oz (96.3 kg)   SpO2 98%   BMI 28.78 kg/m   Wt Readings from Last 3 Encounters:  06/06/19 212 lb 3.2 oz (96.3 kg)  05/23/19 209 lb (94.8 kg)  04/21/19 212 lb 3.2 oz (96.3 kg)  Gen: No acute distress, resting comfortably CV: Regular rate and rhythm with no murmurs appreciated Pulm: Normal work of breathing, clear to auscultation bilaterally with no crackles, wheezes, or rhonchi Neuro: Grossly normal, moves all extremities Psych: Normal affect and thought content      Franklin Rivera M. Jerline Pain, MD 06/06/2019 2:17 PM

## 2019-06-06 NOTE — Patient Instructions (Signed)
It was very nice to see you today!  Please call the infectious disease clinic to set up treatment for hepatitis C.  [address] [place],  [place]  [address] [phone]  Take care, Dr Jerline Pain  Please try these tips to maintain a healthy lifestyle:   Eat at least 3 REAL meals and 1-2 snacks per day.  Aim for no more than 5 hours between eating.  If you eat breakfast, please do so within one hour of getting up.    Each meal should contain half fruits/vegetables, one quarter protein, and one quarter carbs (no bigger than a computer mouse)   Cut down on sweet beverages. This includes juice, soda, and sweet tea.     Drink at least 1 glass of water with each meal and aim for at least 8 glasses per day   Exercise at least 150 minutes every week.    Hepatitis C  Hepatitis C is a liver infection that is caused by a virus. Many people have no symptoms or only mild symptoms. Hepatitis C is contagious. This means that it can spread from person to person. Over time, the infection can lead to serious liver problems. What are the causes? This condition is caused by a virus. People can get this infection through:  Contact with certain body fluids of someone who has the virus. This includes: ? Blood. ? Semen or vaginal fluids.  Childbirth. A woman can pass the virus to her baby during birth.  Blood or organ donations done in the [place] before 1992. What increases the risk? The following things may make you more likely to get this condition:  Having contact with needles or syringes that have the virus on them. This may happen while: ? Getting a treatment that involves putting thin needles through your skin (acupuncture). ? Getting a tattoo. ? Getting a body piercing. ? Injecting drugs.  Having sex with someone who has the virus. The virus can be passed through vaginal, oral, or anal sex.  Getting treatment to filter your blood (kidney dialysis).  Having HIV or  AIDS.  Having a job that involves contact with blood or certain other body fluids, such as in health care. What are the signs or symptoms? Symptoms of this condition include:  Tiredness (fatigue).  Not wanting to eat as much as normal (loss of appetite).  Feeling like you may vomit (nauseous).  Vomiting.  Pain in your belly (abdomen).  Dark yellow pee (urine).  Your skin or the white parts of your eyes turning yellow (jaundice).  Itchy skin.  Light-colored or tan poop (stool).  Joint pain.  Bleeding and bruising that happen often.  Fluid building up in your stomach (ascites). Often, there are no symptoms. How is this treated? Treatment may depend on how bad your condition is, how long it has lasted, and whether you have liver damage. More testing may be done to figure out the best treatment. Treatment may include:  Taking antiviral medicines and other medicines.  Having follow-up treatments every 6-12 months for infections or other liver problems.  Getting a new liver from a donor (liver transplant). Follow these instructions at home: Medicines  Take over-the-counter and prescription medicines only as told by your doctor.  If you were prescribed an antiviral medicine, take it as told by your doctor. Do not stop using the antiviral even if you start to feel better.  Do not take any new medicines unless your doctor says that this is  okay. This includes over-the-counter medicines and supplements. Activity  Rest as needed.  Do not have sex until your doctor says this is okay.  Return to your normal activities as told by your doctor. Ask your doctor what activities are safe for you.  Ask your doctor when you may go back to school or work. Eating and drinking   Eat a balanced diet. Eat plenty of: ? Fruits and vegetables. ? Whole grains. ? Low-fat (lean) meats or non-meat proteins, such as beans or tofu.  Drink enough fluids to keep your pee pale yellow.  Do  not drink alcohol. General instructions  Do not share toothbrushes, nail clippers, or razors.  Wash your hands often with soap and water for at least 20 seconds. If you do not have soap and water, use hand sanitizer.  Cover any cuts or open sores on your skin.  Avoid swimming or using hot tubs if you have open sores or wounds.  Keep all follow-up visits as told by your doctor. This is important. You may need follow-up visits every 6-12 months. How is this prevented?  Wash your hands often with soap and water for at least 20 seconds.  Do not share needles or syringes.  Use a condom every time you have vaginal, oral, or anal sex. Be sure to use it the right way each time. ? Both females and males should wear condoms. ? Condoms should be kept in place from the start to the end of sexual activity. ? Latex condoms should be used, if possible.  Do not handle blood or body fluids without gloves or other protection.  Avoid getting tattoos or piercings in places that are not clean. Where to find more information  Centers for Disease Control and Prevention: http://www.wolf.info/ Contact a doctor if:  You have a fever.  You have belly pain.  Your pee is dark.  Your poop is a light color or tan.  You have joint pain. Get help right away if:  You feel more and more tired.  You feel more and more weak.  You do not feel like eating.  You cannot eat or drink without vomiting.  Your skin or the whites of your eyes turn yellow or turn more yellow than they were before.  You bruise or bleed easily. Summary  Hepatitis C is a liver infection that is caused by a virus.  Over time, the infection can lead to serious liver problems.  The virus can be spread from person to person through blood, semen, or vaginal fluids.  Do not take any new medicines unless your doctor says that this is okay. This information is not intended to replace advice given to you by your health care provider. Make  sure you discuss any questions you have with your health care provider. Document Revised: 12/08/2018 Document Reviewed: 11/15/2018 Elsevier Patient Education  [employer].

## 2019-06-06 NOTE — Assessment & Plan Note (Signed)
Recent transaminases within normal limits and has no signs of jaundice or cirrhosis.  He will follow-up with ID for further treatment.  Advised him that his wife should be checked for hepatitis C as well.

## 2019-06-07 LAB — COLOGUARD: Cologuard: NEGATIVE

## 2019-06-10 ENCOUNTER — Encounter: Payer: Self-pay | Admitting: Family Medicine

## 2019-06-23 ENCOUNTER — Encounter: Payer: Medicare HMO | Admitting: Family

## 2019-06-24 ENCOUNTER — Telehealth: Payer: Self-pay | Admitting: Pharmacy Technician

## 2019-06-24 ENCOUNTER — Ambulatory Visit (INDEPENDENT_AMBULATORY_CARE_PROVIDER_SITE_OTHER): Payer: Medicare HMO | Admitting: Family

## 2019-06-24 ENCOUNTER — Other Ambulatory Visit: Payer: Self-pay

## 2019-06-24 ENCOUNTER — Encounter: Payer: Self-pay | Admitting: Family

## 2019-06-24 VITALS — BP 161/93 | HR 64 | Temp 97.7°F | Ht 72.0 in | Wt 213.0 lb

## 2019-06-24 DIAGNOSIS — B182 Chronic viral hepatitis C: Secondary | ICD-10-CM | POA: Diagnosis not present

## 2019-06-24 NOTE — Patient Instructions (Signed)
Nice to meet you.  We will check your blood work today.  Limit acetaminophen (Tylenol) usage to no more than 2 grams (2,000 mg) per day.  Avoid alcohol.  Do not share toothbrushes or razors.  Practice safe sex to protect against transmission as well as sexually transmitted disease.    Hepatitis C Hepatitis C is a viral infection of the liver. It can lead to scarring of the liver (cirrhosis), liver failure, or liver cancer. Hepatitis C may go undetected for months or years because people with the infection may not have symptoms, or they may have only mild symptoms. What are the causes? This condition is caused by the hepatitis C virus (HCV). The virus can spread from person to person (is contagious) through:  Blood.  Childbirth. A woman who has hepatitis C can pass it to her baby during birth.  Bodily fluids, such as breast milk, tears, semen, vaginal fluids, and saliva.  Blood transfusions or organ transplants done in the [place] before 1992.  What increases the risk? The following factors may make you more likely to develop this condition:  Having contact with unclean (contaminated) needles or syringes. This may result from: ? Acupuncture. ? Tattoing. ? Body piercing. ? Injecting drugs.  Having unprotected sex with someone who is infected.  Needing treatment to filter your blood (kidney dialysis).  Having HIV (human immunodeficiency virus) or AIDS (acquired immunodeficiency syndrome).  Working in a job that involves contact with blood or bodily fluids, such as health care.  What are the signs or symptoms? Symptoms of this condition include:  Fatigue.  Loss of appetite.  Nausea.  Vomiting.  Abdominal pain.  Dark yellow urine.  Yellowish skin and eyes (jaundice).  Itchy skin.  Clay-colored bowel movements.  Joint pain.  Bleeding and bruising easily.  Fluid building up in your stomach (ascites).  In some cases, you may not have any  symptoms. How is this diagnosed? This condition is diagnosed with:  Blood tests.  Other tests to check how well your liver is functioning. They may include: ? Magnetic resonance elastography (MRE). This imaging test uses MRIs and sound waves to measure liver stiffness. ? Transient elastography. This imaging test uses ultrasounds to measure liver stiffness. ? Liver biopsy. This test requires taking a small tissue sample from your liver to examine it under a microscope.  How is this treated? Your health care provider may perform noninvasive tests or a liver biopsy to help decide the best course of treatment. Treatment may include:  Antiviral medicines and other medicines.  Follow-up treatments every 6-12 months for infections or other liver conditions.  Receiving a donated liver (liver transplant).  Follow these instructions at home: Medicines  Take over-the-counter and prescription medicines only as told by your health care provider.  Take your antiviral medicine as told by your health care provider. Do not stop taking the antiviral even if you start to feel better.  Do not take any medicines unless approved by your health care provider, including over-the-counter medicines and birth control pills. Activity  Rest as needed.  Do not have sex unless approved by your health care provider.  Ask your health care provider when you may return to school or work. Eating and drinking  Eat a balanced diet with plenty of fruits and vegetables, whole grains, and lowfat (lean) meats or non-meat proteins (such as beans or tofu).  Drink enough fluids to keep your urine clear or pale yellow.  Do not drink alcohol. General instructions  Keep all follow-up visits as told by your health care provider. This is important. You may need follow-up visits  every 6-12 months. How is this prevented? There is no vaccine for hepatitis C. The only way to prevent the disease is to reduce the risk of exposure to the virus. Make sure you: Wash your hands frequently with soap and water. If soap and water are not available, use hand sanitizer. Do not share needles or syringes. Practice safe sex and use condoms. Avoid handling blood or bodily fluids without gloves or other protection. Avoid getting tattoos or piercings in shops or other locations that are not clean.  Contact a health care provider if: You have a fever. You develop abdominal pain. You pass dark urine. You pass clay-colored stools. You develop joint pain. Get help right away if: You have increasing fatigue or weakness. You lose your appetite. You cannot eat or drink without vomiting. You develop jaundice or your jaundice gets worse. You bruise or bleed easily. Summary Hepatitis C is a viral infection of the liver. It can lead to scarring of the liver (cirrhosis), liver failure, or liver cancer. The hepatitis C virus (HCV) causes this condition. The virus can pass from person to person (is contagious). You should not take any medicines unless approved by your health care provider. This includes over-the-counter medicines and birth control pills. This information is not intended to replace advice given to you by your health care provider. Make sure you discuss any questions you have with your health care provider. Document Released: 03/14/2000 Document Revised: 04/22/2016 Document Reviewed: 04/22/2016 Elsevier Interactive Patient Education  2018 [employer]  

## 2019-06-24 NOTE — Progress Notes (Signed)
Subjective:    Patient ID: Franklin Rivera, male    DOB: [birth year], 69 y.o.   MRN: [id number]  Chief Complaint  Patient presents with  . Hepatitis C    HPI:  Franklin Rivera is a 69 y.o. male with previous medical history of headache and allergies presenting today for initial treatment and evaluation of hepatitis C.  Franklin Rivera was recently seen by primary care with noted elevation in his LFT function with blood work showing positive hepatitis C status with viral load of 13.2 million.  AST was noted to be 33 and ALT 43.  Platelet count was 163.  Franklin Rivera was first diagnosed with Hepatitis C with his screening performed by his primary care provider. Primary risk factor for Hepatitis C is age (born between 12-1962) with no history of IVDU, blood transfusions prior to 1992, sharing of razors/toothbrushes, tattoos, or sexual contact with known positive partner. No personal history of liver disease. Brother has unknown Hepatitis. He has not been previously treated for Hepatitis C and is currently without symptoms including abdominal pain, nausea, vomiting, scleral icterus, or jaundice. He does not use any recreation or illicit drugs, alcohol or tobacco. Previous Hepatitis B and HIV status are unknown.    No Known Allergies    Outpatient Medications Prior to Visit  Medication Sig Dispense Refill  . atorvastatin (LIPITOR) 40 MG tablet Take 1 tablet (40 mg total) by mouth daily. 90 tablet 3  . tadalafil (CIALIS) 10 MG tablet Take 1 tablet (10 mg total) by mouth daily as needed for erectile dysfunction. 30 tablet 0   No facility-administered medications prior to visit.     Past Medical History:  Diagnosis Date  . Allergy   . Headache   . Hyperlipidemia       Past Surgical History:  Procedure Laterality Date  . BLADDER SURGERY    . NASAL FRACTURE SURGERY    . Skull Surgery        Family History  Problem Relation Age of Onset  . Cancer Neg Hx       Social  History   Socioeconomic History  . Marital status: Married    Spouse name: Not on file  . Number of children: Not on file  . Years of education: Not on file  . Highest education level: Not on file  Occupational History  . Not on file  Tobacco Use  . Smoking status: Never Smoker  . Smokeless tobacco: Never Used  Substance and Sexual Activity  . Alcohol use: Never  . Drug use: Not Currently  . Sexual activity: Not on file  Other Topics Concern  . Not on file  Social History Narrative  . Not on file   Social Determinants of Health   Financial Resource Strain:   . Difficulty of Paying Living Expenses:   Food Insecurity:   . Worried About Charity fundraiser in the Last Year:   . Arboriculturist in the Last Year:   Transportation Needs:   . Film/video editor (Medical):   [name] Lack of Transportation (Non-Medical):   Physical Activity:   . Days of Exercise per Week:   . Minutes of Exercise per Session:   Stress:   . Feeling of Stress :   Social Connections:   . Frequency of Communication with Friends and Family:   . Frequency of Social Gatherings with Friends and Family:   . Attends Religious Services:   . Active Member of Clubs  or Organizations:   . Attends Archivist Meetings:   [name] Marital Status:   Intimate Partner Violence:   . Fear of Current or Ex-Partner:   . Emotionally Abused:   [name] Physically Abused:   . Sexually Abused:       Review of Systems  Constitutional: Negative for chills, diaphoresis, fatigue and fever.  Respiratory: Negative for cough, chest tightness, shortness of breath and wheezing.   Cardiovascular: Negative for chest pain.  Gastrointestinal: Negative for abdominal distention, abdominal pain, constipation, diarrhea, nausea and vomiting.  Neurological: Negative for weakness and headaches.  Hematological: Does not bruise/bleed easily.       Objective:    BP (!) 161/93   Pulse 64   Temp 97.7 F (36.5 C) (Oral)   Ht 6' (1.829  m)   Wt 213 lb (96.6 kg)   SpO2 97%   BMI 28.89 kg/m  Nursing note and vital signs reviewed.  Physical Exam Constitutional:      General: He is not in acute distress.    Appearance: He is well-developed.  Cardiovascular:     Rate and Rhythm: Normal rate and regular rhythm.     Heart sounds: Normal heart sounds. No murmur. No friction rub. No gallop.   Pulmonary:     Effort: Pulmonary effort is normal. No respiratory distress.     Breath sounds: Normal breath sounds. No wheezing or rales.  Chest:     Chest wall: No tenderness.  Abdominal:     General: Bowel sounds are normal. There is no distension.     Palpations: Abdomen is soft. There is no mass.     Tenderness: There is no abdominal tenderness. There is no guarding or rebound.  Skin:    General: Skin is warm and dry.  Neurological:     Mental Status: He is alert and oriented to person, place, and time.  Psychiatric:        Behavior: Behavior normal.        Thought Content: Thought content normal.        Judgment: Judgment normal.         Assessment & Plan:   Patient Active Problem List   Diagnosis Date Noted  . Hepatitis C virus infection without hepatic coma 06/06/2019  . Hyperglycemia 05/24/2019  . Erectile dysfunction 05/23/2019  . Chronic sinusitis 04/21/2019  . Dyslipidemia 01/20/2019  . Frontal headache 01/18/2019  . Insomnia 01/18/2019     Problem List Items Addressed This Visit      Digestive   Hepatitis C virus infection without hepatic coma - Primary    Franklin Rivera is a 69 y/o male with Chronic Hepatitis C with initial viral load of 13.2 million. Primary risk factor is age. He is treatment naive and asymptomatic. We discussed the pathogenesis, transmission, prevention, risks if left untreated and treatment options for Hepatitis C. Check blood work for genotype, fibrosis score, Hepatitis B status and HIV status. He met with our pharmacy to complete financial assistance paperwork. Medication decision  pending blood work results. Follow up 1 month after initiation of treatment.       Relevant Orders   Hepatitis B surface antibody,qualitative   Hepatitis B surface antigen   CBC (Completed)   Liver Fibrosis, FibroTest-ActiTest   Hepatitis C genotype   Hepatic function panel (Completed)   Protime-INR (Completed)   HIV antibody (with reflex) (Completed)      I am having Gennaro L. Deeney maintain his tadalafil and atorvastatin.   Follow-up:  1 month after starting medication.    Terri Piedra, MSN, FNP-C Nurse Practitioner [facility] for Infectious Disease [place] number: [phone]

## 2019-06-24 NOTE — Telephone Encounter (Signed)
[facility] Patient Advocate Encounter    Findings of the benefits investigation:   Insurance: [employer] (active Information systems manager)  Prior Authorization: will begin insurance process once medication is prescribed  [facility] Patient Advocate will follow up once patient arrives for their appointment. Patient is household size is 2 and income $1200 with retirement. He will have the medication mailed to the home.   Bartholomew Crews, CPhT Specialty Pharmacy Patient [facility] for Infectious Disease Phone: [phone] Fax: [phone] 06/24/2019 4:15 PM

## 2019-06-25 ENCOUNTER — Encounter: Payer: Self-pay | Admitting: Family

## 2019-06-25 NOTE — Assessment & Plan Note (Signed)
Franklin Rivera is a 69 y/o male with Chronic Hepatitis C with initial viral load of 13.2 million. Primary risk factor is age. He is treatment naive and asymptomatic. We discussed the pathogenesis, transmission, prevention, risks if left untreated and treatment options for Hepatitis C. Check blood work for genotype, fibrosis score, Hepatitis B status and HIV status. He met with our pharmacy to complete financial assistance paperwork. Medication decision pending blood work results. Follow up 1 month after initiation of treatment.

## 2019-06-28 ENCOUNTER — Telehealth: Payer: Self-pay

## 2019-06-28 NOTE — Telephone Encounter (Signed)
Cologuard Negative 

## 2019-07-01 LAB — PROTIME-INR
INR: 1
Prothrombin Time: 10.8 s (ref 9.0–11.5)

## 2019-07-01 LAB — HEPATIC FUNCTION PANEL
AG Ratio: 1.2 (calc) (ref 1.0–2.5)
ALT: 58 U/L — ABNORMAL HIGH (ref 9–46)
AST: 64 U/L — ABNORMAL HIGH (ref 10–35)
Albumin: 3.9 g/dL (ref 3.6–5.1)
Alkaline phosphatase (APISO): 70 U/L (ref 35–144)
Bilirubin, Direct: 0.2 mg/dL (ref 0.0–0.2)
Globulin: 3.2 g/dL (calc) (ref 1.9–3.7)
Indirect Bilirubin: 0.5 mg/dL (calc) (ref 0.2–1.2)
Total Bilirubin: 0.7 mg/dL (ref 0.2–1.2)
Total Protein: 7.1 g/dL (ref 6.1–8.1)

## 2019-07-01 LAB — CBC
HCT: 44 % (ref 38.5–50.0)
Hemoglobin: 14.6 g/dL (ref 13.2–17.1)
MCH: 28.7 pg (ref 27.0–33.0)
MCHC: 33.2 g/dL (ref 32.0–36.0)
MCV: 86.4 fL (ref 80.0–100.0)
MPV: 10.1 fL (ref 7.5–12.5)
Platelets: 160 10*3/uL (ref 140–400)
RBC: 5.09 10*6/uL (ref 4.20–5.80)
RDW: 12.2 % (ref 11.0–15.0)
WBC: 3.3 10*3/uL — ABNORMAL LOW (ref 3.8–10.8)

## 2019-07-01 LAB — LIVER FIBROSIS, FIBROTEST-ACTITEST
ALT: 59 U/L — ABNORMAL HIGH (ref 9–46)
Alpha-2-Macroglobulin: 308 mg/dL — ABNORMAL HIGH (ref 106–279)
Apolipoprotein A1: 144 mg/dL (ref 94–176)
Bilirubin: 0.7 mg/dL (ref 0.2–1.2)
Fibrosis Score: 0.72
GGT: 81 U/L — ABNORMAL HIGH (ref 3–70)
Haptoglobin: 106 mg/dL (ref 43–212)
Necroinflammat ACT Score: 0.5
Reference ID: 3331897

## 2019-07-01 LAB — HEPATITIS C GENOTYPE

## 2019-07-01 LAB — HEPATITIS B SURFACE ANTIBODY,QUALITATIVE: Hep B S Ab: NONREACTIVE

## 2019-07-01 LAB — HEPATITIS B SURFACE ANTIGEN: Hepatitis B Surface Ag: NONREACTIVE

## 2019-07-01 LAB — HIV ANTIBODY (ROUTINE TESTING W REFLEX): HIV 1&2 Ab, 4th Generation: NONREACTIVE

## 2019-07-04 ENCOUNTER — Other Ambulatory Visit: Payer: Self-pay | Admitting: Family

## 2019-07-04 DIAGNOSIS — B182 Chronic viral hepatitis C: Secondary | ICD-10-CM

## 2019-07-04 MED ORDER — SOFOSBUVIR-VELPATASVIR 400-100 MG PO TABS: 1.0000 | ORAL_TABLET | Freq: Every day | ORAL | 2 refills | Status: DC

## 2019-07-05 NOTE — Progress Notes (Signed)
Patient has [employer]. Will begin the prior authorization process and once approved will coordinate his medication through [facility].

## 2019-07-06 NOTE — Telephone Encounter (Signed)
Notified patient of lab results.Patient voices understanding.  

## 2019-07-06 NOTE — Telephone Encounter (Signed)
[facility] Patient Advocate Encounter  Prior Authorization for Raeanne Gathers has been approved.    Effective dates: 07/06/2019 through 09/28/2019  Patients co-pay is $8.68.   [facility] will continue to follow.  Bartholomew Crews, CPhT Specialty Pharmacy Patient [facility] for Infectious Disease Phone: [phone] Fax: [phone] 07/06/2019 4:24 PM

## 2019-07-06 NOTE — Telephone Encounter (Signed)
[facility] Patient Advocate Encounter   Received notification from [facility] that prior authorization for Franklin Rivera is required.   PA submitted on 07/06/2019 Key [id number] Status is pending    [facility] will continue to follow.  Bartholomew Crews, CPhT Specialty Pharmacy Patient [facility] for Infectious Disease Phone: [phone] Fax: [phone] 07/06/2019 3:52 PM

## 2019-07-11 ENCOUNTER — Other Ambulatory Visit: Payer: Self-pay

## 2019-07-11 ENCOUNTER — Ambulatory Visit (INDEPENDENT_AMBULATORY_CARE_PROVIDER_SITE_OTHER): Payer: Medicare HMO

## 2019-07-11 DIAGNOSIS — Z0000 Encounter for general adult medical examination without abnormal findings: Secondary | ICD-10-CM | POA: Diagnosis not present

## 2019-07-11 NOTE — Patient Instructions (Signed)
[name] , Thank you for taking time to come for your Medicare Wellness Visit. I appreciate your ongoing commitment to your health goals. Please review the following plan we discussed and let me know if I can assist you in the future.   Screening recommendations/referrals: Colorectal Screening: up to date; Cologuard normal 06/07/19  Vision and Dental Exams: Recommended annual ophthalmology exams for early detection of glaucoma and other disorders of the eye Recommended annual dental exams for proper oral hygiene  Vaccinations: Influenza vaccine: recommended each fall  Pneumococcal vaccine: recommended  Tdap vaccine: recommended every 10 years; Please call your insurance company to determine your out of pocket expense. You also receive this vaccine at your local pharmacy or Health Dept. Shingles vaccine: Please call your insurance company to determine your out of pocket expense for the Shingrix vaccine. (see handout)   Advanced directives: Advance directives discussed with you today. I have provided a copy for you to complete at home and have notarized. Once this is complete please bring a copy in to our office so we can scan it into your chart.  Goals: Recommend to drink at least 6-8 8oz glasses of water per day and consume a balanced diet rich in fresh fruits and vegetables.   Next appointment: Please schedule your Annual Wellness Visit with your Nurse Health Advisor in one year.  Preventive Care 5 Years and Older, Male Preventive care refers to lifestyle choices and visits with your health care provider that can promote health and wellness. What does preventive care include?  A yearly physical exam. This is also called an annual well check.  Dental exams once or twice a year.  Routine eye exams. Ask your health care provider how often you should have your eyes checked.  Personal lifestyle choices, including:  Daily care of your teeth and gums.  Regular physical  activity.  Eating a healthy diet.  Avoiding tobacco and drug use.  Limiting alcohol use.  Practicing safe sex.  Taking low doses of aspirin every day if recommended by your health care provider..  Taking vitamin and mineral supplements as recommended by your health care provider. What happens during an annual well check? The services and screenings done by your health care provider during your annual well check will depend on your age, overall health, lifestyle risk factors, and family history of disease. Counseling  Your health care provider may ask you questions about your:  Alcohol use.  Tobacco use.  Drug use.  Emotional well-being.  Home and relationship well-being.  Sexual activity.  Eating habits.  History of falls.  Memory and ability to understand (cognition).  Work and work Statistician. Screening  You may have the following tests or measurements:  Height, weight, and BMI.  Blood pressure.  Lipid and cholesterol levels. These may be checked every 5 years, or more frequently if you are over 75 years old.  Skin check.  Lung cancer screening. You may have this screening every year starting at age 57 if you have a 30-pack-year history of smoking and currently smoke or have quit within the past 15 years.  Fecal occult blood test (FOBT) of the stool. You may have this test every year starting at age 75.  Flexible sigmoidoscopy or colonoscopy. You may have a sigmoidoscopy every 5 years or a colonoscopy every 10 years starting at age 79.  Prostate cancer screening. Recommendations will vary depending on your family history and other risks.  Hepatitis C blood test.  Hepatitis B blood test.  Sexually transmitted disease (STD) testing.  Diabetes screening. This is done by checking your blood sugar (glucose) after you have not eaten for a while (fasting). You may have this done every 1-3 years.  Abdominal aortic aneurysm (AAA) screening. You may need this  if you are a current or former smoker.  Osteoporosis. You may be screened starting at age 48 if you are at high risk. Talk with your health care provider about your test results, treatment options, and if necessary, the need for more tests. Vaccines  Your health care provider may recommend certain vaccines, such as:  Influenza vaccine. This is recommended every year.  Tetanus, diphtheria, and acellular pertussis (Tdap, Td) vaccine. You may need a Td booster every 10 years.  Zoster vaccine. You may need this after age 4.  Pneumococcal 13-valent conjugate (PCV13) vaccine. One dose is recommended after age 31.  Pneumococcal polysaccharide (PPSV23) vaccine. One dose is recommended after age 7. Talk to your health care provider about which screenings and vaccines you need and how often you need them. This information is not intended to replace advice given to you by your health care provider. Make sure you discuss any questions you have with your health care provider. Document Released: 04/13/2015 Document Revised: 12/05/2015 Document Reviewed: 01/16/2015 Elsevier Interactive Patient Education  2017 [place] Prevention in the Home Falls can cause injuries. They can happen to people of all ages. There are many things you can do to make your home safe and to help prevent falls. What can I do on the outside of my home?  Regularly fix the edges of walkways and driveways and fix any cracks.  Remove anything that might make you trip as you walk through a door, such as a raised step or threshold.  Trim any bushes or trees on the path to your home.  Use bright outdoor lighting.  Clear any walking paths of anything that might make someone trip, such as rocks or tools.  Regularly check to see if handrails are loose or broken. Make sure that both sides of any steps have handrails.  Any raised decks and porches should have guardrails on the edges.  Have any leaves, snow, or ice  cleared regularly.  Use sand or salt on walking paths during winter.  Clean up any spills in your garage right away. This includes oil or grease spills. What can I do in the bathroom?  Use night lights.  Install grab bars by the toilet and in the tub and shower. Do not use towel bars as grab bars.  Use non-skid mats or decals in the tub or shower.  If you need to sit down in the shower, use a plastic, non-slip stool.  Keep the floor dry. Clean up any water that spills on the floor as soon as it happens.  Remove soap buildup in the tub or shower regularly.  Attach bath mats securely with double-sided non-slip rug tape.  Do not have throw rugs and other things on the floor that can make you trip. What can I do in the bedroom?  Use night lights.  Make sure that you have a light by your bed that is easy to reach.  Do not use any sheets or blankets that are too big for your bed. They should not hang down onto the floor.  Have a firm chair that has side arms. You can use this for support while you get dressed.  Do not have throw rugs and other  things on the floor that can make you trip. What can I do in the kitchen?  Clean up any spills right away.  Avoid walking on wet floors.  Keep items that you use a lot in easy-to-reach places.  If you need to reach something above you, use a strong step stool that has a grab bar.  Keep electrical cords out of the way.  Do not use floor polish or wax that makes floors slippery. If you must use wax, use non-skid floor wax.  Do not have throw rugs and other things on the floor that can make you trip. What can I do with my stairs?  Do not leave any items on the stairs.  Make sure that there are handrails on both sides of the stairs and use them. Fix handrails that are broken or loose. Make sure that handrails are as long as the stairways.  Check any carpeting to make sure that it is firmly attached to the stairs. Fix any carpet that  is loose or worn.  Avoid having throw rugs at the top or bottom of the stairs. If you do have throw rugs, attach them to the floor with carpet tape.  Make sure that you have a light switch at the top of the stairs and the bottom of the stairs. If you do not have them, ask someone to add them for you. What else can I do to help prevent falls?  Wear shoes that:  Do not have high heels.  Have rubber bottoms.  Are comfortable and fit you well.  Are closed at the toe. Do not wear sandals.  If you use a stepladder:  Make sure that it is fully opened. Do not climb a closed stepladder.  Make sure that both sides of the stepladder are locked into place.  Ask someone to hold it for you, if possible.  Clearly mark and make sure that you can see:  Any grab bars or handrails.  First and last steps.  Where the edge of each step is.  Use tools that help you move around (mobility aids) if they are needed. These include:  Canes.  Walkers.  Scooters.  Crutches.  Turn on the lights when you go into a dark area. Replace any light bulbs as soon as they burn out.  Set up your furniture so you have a clear path. Avoid moving your furniture around.  If any of your floors are uneven, fix them.  If there are any pets around you, be aware of where they are.  Review your medicines with your doctor. Some medicines can make you feel dizzy. This can increase your chance of falling. Ask your doctor what other things that you can do to help prevent falls. This information is not intended to replace advice given to you by your health care provider. Make sure you discuss any questions you have with your health care provider. Document Released: 01/11/2009 Document Revised: 08/23/2015 Document Reviewed: 04/21/2014 Elsevier Interactive Patient Education  2017 [employer]..

## 2019-07-11 NOTE — Progress Notes (Signed)
This visit is being conducted via phone call due to the COVID-19 pandemic. This patient has given me verbal consent via phone to conduct this visit, patient states they are participating from their home address. Some vital signs may be absent or patient reported.   Patient identification: identified by name, DOB, and current address.  Location provider: [facility], Office Persons participating in the virtual visit: Denman George LPN, patient, and Dr. Dimas Chyle   Subjective:   Franklin Rivera is a 69 y.o. male who presents for Medicare Annual/Subsequent preventive examination.  Review of Systems:   Cardiac Risk Factors include: advanced age (>52men, >31 women);male gender;dyslipidemia    Objective:    Vitals: There were no vitals taken for this visit.  There is no height or weight on file to calculate BMI.  Advanced Directives 07/11/2019  Does Patient Have a Medical Advance Directive? No  Would patient like information on creating a medical advance directive? Yes (MAU/Ambulatory/Procedural Areas - Information given)    Tobacco Social History   Tobacco Use  Smoking Status Never Smoker  Smokeless Tobacco Never Used     Counseling given: Not Answered   Clinical Intake:  Pre-visit preparation completed: Yes  Pain : No/denies pain  Diabetes: No  How often do you need to have someone help you when you read instructions, pamphlets, or other written materials from your doctor or pharmacy?: 1 - Never  Interpreter Needed?: No  Information entered by :: Denman George LPN  Past Medical History:  Diagnosis Date  . Allergy   . Headache   . Hyperlipidemia    Past Surgical History:  Procedure Laterality Date  . BLADDER SURGERY    . NASAL FRACTURE SURGERY    . Skull Surgery     Family History  Problem Relation Age of Onset  . Cancer Neg Hx    Social History   Socioeconomic History  . Marital status: Married    Spouse name: Not on file  . Number of children:  Not on file  . Years of education: Not on file  . Highest education level: Not on file  Occupational History  . Not on file  Tobacco Use  . Smoking status: Never Smoker  . Smokeless tobacco: Never Used  Substance and Sexual Activity  . Alcohol use: Never  . Drug use: Not Currently  . Sexual activity: Not on file  Other Topics Concern  . Not on file  Social History Narrative  . Not on file   Social Determinants of Health   Financial Resource Strain:   . Difficulty of Paying Living Expenses:   Food Insecurity:   . Worried About Charity fundraiser in the Last Year:   . Arboriculturist in the Last Year:   Transportation Needs:   . Film/video editor (Medical):   [name] Lack of Transportation (Non-Medical):   Physical Activity:   . Days of Exercise per Week:   . Minutes of Exercise per Session:   Stress:   . Feeling of Stress :   Social Connections:   . Frequency of Communication with Friends and Family:   . Frequency of Social Gatherings with Friends and Family:   . Attends Religious Services:   . Active Member of Clubs or Organizations:   . Attends Archivist Meetings:   [name] Marital Status:     Outpatient Encounter Medications as of 07/11/2019  Medication Sig  . atorvastatin (LIPITOR) 40 MG tablet Take 1 tablet (40  mg total) by mouth daily.  . Sofosbuvir-Velpatasvir (EPCLUSA) 400-100 MG TABS Take 1 tablet by mouth daily. Take 1 tablet by mouth daily.  . tadalafil (CIALIS) 10 MG tablet Take 1 tablet (10 mg total) by mouth daily as needed for erectile dysfunction.   No facility-administered encounter medications on file as of 07/11/2019.    Activities of Daily Living In your present state of health, do you have any difficulty performing the following activities: 07/11/2019  Hearing? N  Vision? N  Difficulty concentrating or making decisions? N  Walking or climbing stairs? N  Dressing or bathing? N  Doing errands, shopping? N  Preparing Food and eating ? N    Using the Toilet? N  In the past six months, have you accidently leaked urine? N  Do you have problems with loss of bowel control? N  Managing your Medications? N  Managing your Finances? N  Housekeeping or managing your Housekeeping? N    Patient Care Team: [name], MD as PCP - General (Family Medicine) Golden Circle, FNP as Nurse Practitioner (Family Medicine) [facility] as Consulting Physician Rozetta Nunnery, MD as Consulting Physician (Otolaryngology)   Assessment:   This is a routine wellness examination for Franklin Rivera.  Exercise Activities and Dietary recommendations Current Exercise Habits: The patient does not participate in regular exercise at present  Goals   None     Fall Risk Fall Risk  07/11/2019 06/24/2019 05/23/2019 01/18/2019  Falls in the past year? 0 0 0 0  Number falls in past yr: 0 - - -  Injury with Fall? 0 - - -  Follow up Falls evaluation completed;Education provided;Falls prevention discussed Falls evaluation completed - -   Is the patient's home free of loose throw rugs in walkways, pet beds, electrical cords, etc?   yes      Grab bars in the bathroom? yes      Handrails on the stairs?   yes      Adequate lighting?   yes   Depression Screen PHQ 2/9 Scores 07/11/2019 06/24/2019 05/23/2019 01/18/2019  PHQ - 2 Score 0 0 0 0  PHQ- 9 Score - - - 5    Cognitive Function-no cognitive concerns at this time    6CIT Screen 07/11/2019  What Year? 0 points  What month? 0 points  What time? 0 points  Count back from 20 0 points  Months in reverse 0 points  Repeat phrase 0 points  Total Score 0     There is no immunization history on file for this patient.  Qualifies for Shingles Vaccine? Discussed and patient will check with pharmacy for coverage.  Patient education handout provided   Screening Tests Health Maintenance  Topic Date Due  . PNA vac Low Risk Adult (1 of 2 - PCV13) 01/18/2020 (Originally 06/23/2016)  .  INFLUENZA VACCINE  10/30/2019  . Fecal DNA (Cologuard)  06/07/2022  . TETANUS/TDAP  12/19/2024  . Hepatitis C Screening  Completed   Cancer Screenings: Lung: Low Dose CT Chest recommended if Age 33-80 years, 30 pack-year currently smoking OR have quit w/in 15years. Patient does not qualify. Colorectal: Cologuard normal 06/07/19      Plan:    I have personally reviewed and addressed the Medicare Annual Wellness questionnaire and have noted the following in the patient's chart:  A. Medical and social history B. Use of alcohol, tobacco or illicit drugs  C. Current medications and supplements D. Functional ability and status E.  Nutritional status F.  Physical activity G. Advance directives H. List of other physicians I.  Hospitalizations, surgeries, and ER visits in previous 12 months J.  [place] such as hearing and vision if needed, cognitive and depression L. Referrals, records requested, and appointments- none   In addition, I have reviewed and discussed with patient certain preventive protocols, quality metrics, and best practice recommendations. A written personalized care plan for preventive services as well as general preventive health recommendations were provided to patient.   Signed,  Denman George, LPN  Nurse Health Advisor   Nurse Notes: Patient complains of problems with insomnia.  States that he only drinks 1 cup of coffee daily and no other caffeinated beverages.  Is not currently taking anything otc to help with sleep.  Normal sleeps 4-5 hours per night and has trouble staying asleep.  Please advise.

## 2019-07-11 NOTE — Progress Notes (Signed)
I have personally reviewed the Medicare Annual Wellness Visit and agree with the documentation.  Algis Greenhouse. Jerline Pain, MD 07/11/2019 2:15 PM

## 2019-07-12 ENCOUNTER — Telehealth: Payer: Self-pay | Admitting: Pharmacy Technician

## 2019-07-12 ENCOUNTER — Other Ambulatory Visit: Payer: Self-pay

## 2019-07-12 ENCOUNTER — Ambulatory Visit (INDEPENDENT_AMBULATORY_CARE_PROVIDER_SITE_OTHER): Payer: Medicare HMO | Admitting: Pharmacist

## 2019-07-12 ENCOUNTER — Telehealth: Payer: Self-pay

## 2019-07-12 ENCOUNTER — Encounter: Payer: Self-pay | Admitting: Pharmacy Technician

## 2019-07-12 DIAGNOSIS — B182 Chronic viral hepatitis C: Secondary | ICD-10-CM | POA: Diagnosis not present

## 2019-07-12 NOTE — Progress Notes (Signed)
HPI: Franklin Rivera is a 69 y.o. male who presents to the [facility] for Hepatitis C follow-up.  Medication: Epclusa for 12 weeks  Start Date: 4/14  Hepatitis C Genotype: 1a  Fibrosis Score: F3  Hepatitis C RNA: 13,200,000  Patient Active Problem List   Diagnosis Date Noted  . Hepatitis C virus infection without hepatic coma 06/06/2019  . Hyperglycemia 05/24/2019  . Erectile dysfunction 05/23/2019  . Chronic sinusitis 04/21/2019  . Dyslipidemia 01/20/2019  . Frontal headache 01/18/2019  . Insomnia 01/18/2019    Patient's Medications  New Prescriptions   No medications on file  Previous Medications   ATORVASTATIN (LIPITOR) 40 MG TABLET    Take 1 tablet (40 mg total) by mouth daily.   SOFOSBUVIR-VELPATASVIR (EPCLUSA) 400-100 MG TABS    Take 1 tablet by mouth daily. Take 1 tablet by mouth daily.   TADALAFIL (CIALIS) 10 MG TABLET    Take 1 tablet (10 mg total) by mouth daily as needed for erectile dysfunction.  Modified Medications   No medications on file  Discontinued Medications   No medications on file    Allergies: No Known Allergies  Past Medical History: Past Medical History:  Diagnosis Date  . Allergy   . Headache   . Hyperlipidemia     Social History: Social History   Socioeconomic History  . Marital status: Married    Spouse name: Not on file  . Number of children: Not on file  . Years of education: Not on file  . Highest education level: Not on file  Occupational History  . Not on file  Tobacco Use  . Smoking status: Never Smoker  . Smokeless tobacco: Never Used  Substance and Sexual Activity  . Alcohol use: Never  . Drug use: Not Currently  . Sexual activity: Not on file  Other Topics Concern  . Not on file  Social History Narrative  . Not on file   Social Determinants of Health   Financial Resource Strain:   . Difficulty of Paying Living Expenses:   Food Insecurity:   . Worried About Charity fundraiser in the Last  Year:   . Arboriculturist in the Last Year:   Transportation Needs:   . Film/video editor (Medical):   [name] Lack of Transportation (Non-Medical):   Physical Activity:   . Days of Exercise per Week:   . Minutes of Exercise per Session:   Stress:   . Feeling of Stress :   Social Connections:   . Frequency of Communication with Friends and Family:   . Frequency of Social Gatherings with Friends and Family:   . Attends Religious Services:   . Active Member of Clubs or Organizations:   . Attends Archivist Meetings:   [name] Marital Status:     Labs: Hepatitis C Lab Results  Component Value Date   HCVGENOTYPE 1a 06/24/2019   HEPCAB REACTIVE (A) 05/23/2019   HCVRNAPCRQN 13,200,000 (H) 05/23/2019   FIBROSTAGE F3 06/24/2019   Hepatitis B Lab Results  Component Value Date   HEPBSAB NON-REACTIVE 06/24/2019   HEPBSAG NON-REACTIVE 06/24/2019   Hepatitis A No results found for: HAV HIV Lab Results  Component Value Date   HIV NON-REACTIVE 06/24/2019   Lab Results  Component Value Date   CREATININE 0.82 05/23/2019   CREATININE 0.94 01/18/2019   Lab Results  Component Value Date   AST 64 (H) 06/24/2019   AST 33 05/23/2019   AST 23 01/18/2019  ALT 59 (H) 06/24/2019   ALT 58 (H) 06/24/2019   ALT 43 05/23/2019   INR 1.0 06/24/2019    Assessment: Franklin Rivera presents to the clinic today to discuss initiation of treatment for his hepatitis C infection. He reports he was anxious about starting his medication and there was some things he was unclear on and that's why he presented to clinic today.   He will start on Epclusa tomorrow after he picks it up from the [employer]. I counseled him that Franklin Rivera is a one pill once a day medication that he will take for 12 weeks. We spoke about how important adherence is with this medication to ensure he is cured of his hepatitis C infection. I advised him to take the medication at the same time everyday, which also  helps people develop a routine and remember to take their medication. He says he is a very regimented person and is confident he will have 100% adherence.   He should take Epclusa with some food to help it be better absorbed. He does report he takes famotidine occasionally and I told him if he can he should avoid taking it since it can hinder the absorption of Epclusa. If he does need to take it, he knows he needs to take Epclusa either 2 hours before or 6 hours after the famotidine. I also spoke with him about some of the common side effects patients experience on this medication including fatigue, headache or upset stomach. I reassured him these typically resolve as therapy is continued. If he experienced a headache he should take ibuprofen instead of tylenol. If he experiences any intolerable side effects he knows to contact our office and not to just stop taking the medication.   We took some time to go over the follow up process and the different labs we monitor in patients on therapy. Giving him some perspective on what was to come seemed to help his anxiety. He was grateful for the time and all of his questions were answered.   Plan: -Eplclusa to start 4/14 -Follow up 5/17 with Cassie    Nicoletta Dress, PharmD PGY2 Infectious Disease Pharmacy Resident  [facility] 07/12/2019, 11:42 AM

## 2019-07-12 NOTE — Telephone Encounter (Signed)
error 

## 2019-07-12 NOTE — Telephone Encounter (Signed)
[facility] Patient Advocate Encounter   I was successful in securing patient a $30,000 grant from [employer] (PAF) to provide copayment coverage for Epclusa.    I have spoken with the patient and they will pick up tomorrow at [facility].    The billing information is  RxBin: [id number] PCN: [id number] Member ID: [id number] Group ID: [id number] Dates of Eligibility: 06/12/2019 through 06/10/2020  Patient knows to call the office with questions or concerns.  Bartholomew Crews, CPhT Specialty Pharmacy Patient [facility] Infectious Disease Phone: [phone] Fax: [phone] 07/12/2019 12:02 PM

## 2019-07-13 MED FILL — EPCLUSA 400 MG-100 MG TAB: 400-100 | 28 days supply | Qty: 28 | Fill #0

## 2019-07-25 ENCOUNTER — Telehealth: Payer: Self-pay | Admitting: Otolaryngology

## 2019-07-25 NOTE — Telephone Encounter (Signed)
Patient called today stating that he is continuing to have intermittent headaches that he thinks is related to his sinuses.  When he uses a Vicks inhaler this seems to make it better.  He had a previous CT scan that demonstrated complete opacification of the left sphenoid sinus performed in November of last year.  I had seen him and treated him with 2-week course of antibiotics as well as regular use of nasal steroid spray 6 weeks ago. Discussed with him today concerning need for repeat CT scan of the sinuses and possible surgical intervention if the sphenoid sinus opacification persists.. We will schedule patient for another CT scan of the sinuses and he will follow-up here following the CT scan.

## 2019-07-26 ENCOUNTER — Other Ambulatory Visit (INDEPENDENT_AMBULATORY_CARE_PROVIDER_SITE_OTHER): Payer: Self-pay

## 2019-07-26 DIAGNOSIS — J323 Chronic sphenoidal sinusitis: Secondary | ICD-10-CM

## 2019-08-05 MED FILL — EPCLUSA 400 MG-100 MG TAB: 400-100 | 28 days supply | Qty: 28 | Fill #1

## 2019-08-10 ENCOUNTER — Other Ambulatory Visit: Payer: Self-pay

## 2019-08-10 ENCOUNTER — Ambulatory Visit
Admission: RE | Admit: 2019-08-10 | Discharge: 2019-08-10 | Disposition: A | Discharge status: Home / Self Care | Payer: Medicare HMO | Source: Ambulatory Visit | Attending: Otolaryngology | Admitting: Otolaryngology

## 2019-08-10 DIAGNOSIS — J329 Chronic sinusitis, unspecified: Secondary | ICD-10-CM | POA: Diagnosis not present

## 2019-08-10 DIAGNOSIS — J323 Chronic sphenoidal sinusitis: Secondary | ICD-10-CM

## 2019-08-15 ENCOUNTER — Ambulatory Visit (INDEPENDENT_AMBULATORY_CARE_PROVIDER_SITE_OTHER): Payer: Medicare HMO | Admitting: Pharmacist

## 2019-08-15 ENCOUNTER — Other Ambulatory Visit: Payer: Self-pay

## 2019-08-15 DIAGNOSIS — B182 Chronic viral hepatitis C: Secondary | ICD-10-CM

## 2019-08-15 NOTE — Progress Notes (Addendum)
HPI: Franklin Rivera is a 69 y.o. male who presents to the [facility] for Hepatitis C follow-up.  Medication: Epclusa x 12 weeks  Start Date: 07/13/19  Hepatitis C Genotype: 1a  Fibrosis Score: F3  Hepatitis C RNA: 13.2 million on 05/23/19  Patient Active Problem List   Diagnosis Date Noted  . Hepatitis C virus infection without hepatic coma 06/06/2019  . Hyperglycemia 05/24/2019  . Erectile dysfunction 05/23/2019  . Chronic sinusitis 04/21/2019  . Dyslipidemia 01/20/2019  . Frontal headache 01/18/2019  . Insomnia 01/18/2019    Patient's Medications  New Prescriptions   No medications on file  Previous Medications   ATORVASTATIN (LIPITOR) 40 MG TABLET    Take 1 tablet (40 mg total) by mouth daily.   SOFOSBUVIR-VELPATASVIR (EPCLUSA) 400-100 MG TABS    Take 1 tablet by mouth daily. Take 1 tablet by mouth daily.   TADALAFIL (CIALIS) 10 MG TABLET    Take 1 tablet (10 mg total) by mouth daily as needed for erectile dysfunction.  Modified Medications   No medications on file  Discontinued Medications   No medications on file    Allergies: No Known Allergies  Past Medical History: Past Medical History:  Diagnosis Date  . Allergy   . Headache   . Hyperlipidemia     Social History: Social History   Socioeconomic History  . Marital status: Married    Spouse name: Not on file  . Number of children: Not on file  . Years of education: Not on file  . Highest education level: Not on file  Occupational History  . Not on file  Tobacco Use  . Smoking status: Never Smoker  . Smokeless tobacco: Never Used  Substance and Sexual Activity  . Alcohol use: Never  . Drug use: Not Currently  . Sexual activity: Not on file  Other Topics Concern  . Not on file  Social History Narrative  . Not on file   Social Determinants of Health   Financial Resource Strain:   . Difficulty of Paying Living Expenses:   Food Insecurity:   . Worried About Charity fundraiser  in the Last Year:   . Arboriculturist in the Last Year:   Transportation Needs:   . Film/video editor (Medical):   [name] Lack of Transportation (Non-Medical):   Physical Activity:   . Days of Exercise per Week:   . Minutes of Exercise per Session:   Stress:   . Feeling of Stress :   Social Connections:   . Frequency of Communication with Friends and Family:   . Frequency of Social Gatherings with Friends and Family:   . Attends Religious Services:   . Active Member of Clubs or Organizations:   . Attends Archivist Meetings:   [name] Marital Status:     Labs: Hepatitis C Lab Results  Component Value Date   HCVGENOTYPE 1a 06/24/2019   HEPCAB REACTIVE (A) 05/23/2019   HCVRNAPCRQN 13,200,000 (H) 05/23/2019   FIBROSTAGE F3 06/24/2019   Hepatitis B Lab Results  Component Value Date   HEPBSAB NON-REACTIVE 06/24/2019   HEPBSAG NON-REACTIVE 06/24/2019   Hepatitis A No results found for: HAV HIV Lab Results  Component Value Date   HIV NON-REACTIVE 06/24/2019   Lab Results  Component Value Date   CREATININE 0.82 05/23/2019   CREATININE 0.94 01/18/2019   Lab Results  Component Value Date   AST 64 (H) 06/24/2019   AST 33 05/23/2019  AST 23 01/18/2019   ALT 59 (H) 06/24/2019   ALT 58 (H) 06/24/2019   ALT 43 05/23/2019   INR 1.0 06/24/2019    Assessment: Franklin Rivera is here today to follow up for his Hepatitis C infection.  He started 12 weeks of Epclusa back in April and is doing very well so far. He takes it every day around 2-3 am as he is an "early riser". He has missed no doses and is committed to not missing any during his 3 months.    He has had some slight headaches since starting but nothing he cannot handle. He actually states that he has had more energy since starting. He has received and started his 2nd month. I reminded him that he will need just one more and will be finished.  Congratulated him on his perfect adherence and encouraged continued compliance.  Will check labs today and see him back when he has completed treatment.  Will send him back to [facility] for his cure visit to see if he needs any further follow up of his liver.    Plan: - Continue Epclusa x 12 weeks - Hep C viral load + CMET today - F/up with me again 7/19 at [facility]. Ameenah Prosser, PharmD, BCIDP, AAHIVP, CPP Clinical Pharmacist Practitioner Infectious Diseases [facility] 08/15/2019, 11:05 AM

## 2019-08-17 LAB — COMPREHENSIVE METABOLIC PANEL
AG Ratio: 1.4 (calc) (ref 1.0–2.5)
ALT: 11 U/L (ref 9–46)
AST: 14 U/L (ref 10–35)
Albumin: 4.3 g/dL (ref 3.6–5.1)
Alkaline phosphatase (APISO): 59 U/L (ref 35–144)
BUN: 10 mg/dL (ref 7–25)
CO2: 32 mmol/L (ref 20–32)
Calcium: 9.4 mg/dL (ref 8.6–10.3)
Chloride: 102 mmol/L (ref 98–110)
Creat: 0.88 mg/dL (ref 0.70–1.25)
Globulin: 3.1 g/dL (calc) (ref 1.9–3.7)
Glucose, Bld: 114 mg/dL — ABNORMAL HIGH (ref 65–99)
Potassium: 4.8 mmol/L (ref 3.5–5.3)
Sodium: 139 mmol/L (ref 135–146)
Total Bilirubin: 0.4 mg/dL (ref 0.2–1.2)
Total Protein: 7.4 g/dL (ref 6.1–8.1)

## 2019-08-17 LAB — HEPATITIS C RNA QUANTITATIVE
HCV Quantitative Log: <1.18 Log IU/mL — AB
HCV RNA, PCR, QN: <15 IU/mL — AB

## 2019-08-30 ENCOUNTER — Telehealth: Payer: Self-pay | Admitting: Family Medicine

## 2019-08-30 NOTE — Telephone Encounter (Signed)
Patient is calling in asking for the results of his x-ray, states he hasnt heard anything and wanted to follow up.

## 2019-08-31 NOTE — Telephone Encounter (Signed)
Unable to contact Pt. No voice mail  Pt need to contact Rozetta Nunnery, MD office for results

## 2019-09-01 ENCOUNTER — Telehealth: Payer: Self-pay | Admitting: Family Medicine

## 2019-09-01 MED FILL — EPCLUSA 400 MG-100 MG TAB: 400-100 | 28 days supply | Qty: 28 | Fill #2

## 2019-09-01 NOTE — Telephone Encounter (Signed)
Error

## 2019-09-02 ENCOUNTER — Telehealth (INDEPENDENT_AMBULATORY_CARE_PROVIDER_SITE_OTHER): Payer: Medicare HMO | Admitting: Family Medicine

## 2019-09-02 ENCOUNTER — Encounter: Payer: Self-pay | Admitting: Family Medicine

## 2019-09-02 VITALS — Ht 72.0 in | Wt 205.0 lb

## 2019-09-02 DIAGNOSIS — J329 Chronic sinusitis, unspecified: Secondary | ICD-10-CM

## 2019-09-02 MED ORDER — AMOXICILLIN-POT CLAVULANATE 875-125 MG PO TABS: 1.0000 | ORAL_TABLET | Freq: Two times a day (BID) | ORAL | 0 refills | Status: DC

## 2019-09-02 MED ORDER — PREDNISONE 50 MG PO TABS: ORAL_TABLET | ORAL | 0 refills | Status: DC

## 2019-09-02 NOTE — Progress Notes (Signed)
   Franklin Rivera is a 69 y.o. male who presents today for a virtual office visit.  Assessment/Plan:  Chronic Problems Addressed Today: Chronic sinusitis Symptoms still uncontrolled.  Recent CT scan showed persistent sinusitis.  Will start another course of Augmentin and prednisone and have him follow-up with ENT -may need operative management per her last ENT note.     Subjective:  HPI:  Patient here for sinusitis follow-up.  Has been following with ENT but has not seen them for a few months.  He has reportedly tried to call the office but has not been able to schedule an appointment.  Symptoms have been still uncontrolled.  Had a CT scan done about a month ago which showed persistent sinusitis.  Still has occasional drainage and headache.  Was on antibiotics several months ago which helped for short period of time.        Objective/Observations  Physical Exam: Gen: NAD, resting comfortably Pulm: Normal work of breathing Neuro: Grossly normal, moves all extremities Psych: Normal affect and thought content  Virtual Visit via Video   I connected with Franklin Rivera on 09/02/19 at  3:40 PM EDT by a video enabled telemedicine application and verified that I am speaking with the correct person using two identifiers. The limitations of evaluation and management by telemedicine and the availability of in person appointments were discussed. The patient expressed understanding and agreed to proceed.   Patient location: Home Provider location: [place] participating in the virtual visit: Myself and Patient     Algis Greenhouse. Jerline Pain, MD 09/02/2019 3:46 PM

## 2019-09-02 NOTE — Assessment & Plan Note (Signed)
Symptoms still uncontrolled.  Recent CT scan showed persistent sinusitis.  Will start another course of Augmentin and prednisone and have him follow-up with ENT -may need operative management per her last ENT note.

## 2019-09-05 NOTE — Telephone Encounter (Signed)
Please advise 

## 2019-09-07 ENCOUNTER — Telehealth: Payer: Self-pay | Admitting: Family Medicine

## 2019-09-07 NOTE — Telephone Encounter (Signed)
Patient states he was told by Dr. Jerline Pain to give the office a call today to check on a referral to Dr. Jennell Corner office.   Patient states Dr. Jerline Pain was to refer him again to Dr. Lucia Gaskins for sinus surgery.  Can you please follow up with patient in regard.

## 2019-09-12 DIAGNOSIS — R69 Illness, unspecified: Secondary | ICD-10-CM | POA: Diagnosis not present

## 2019-09-13 ENCOUNTER — Other Ambulatory Visit: Payer: Self-pay

## 2019-09-13 ENCOUNTER — Ambulatory Visit (INDEPENDENT_AMBULATORY_CARE_PROVIDER_SITE_OTHER): Payer: Medicare HMO | Admitting: Otolaryngology

## 2019-09-13 ENCOUNTER — Encounter (INDEPENDENT_AMBULATORY_CARE_PROVIDER_SITE_OTHER): Payer: Self-pay | Admitting: Otolaryngology

## 2019-09-13 VITALS — Temp 97.5°F

## 2019-09-13 DIAGNOSIS — J343 Hypertrophy of nasal turbinates: Secondary | ICD-10-CM | POA: Diagnosis not present

## 2019-09-13 DIAGNOSIS — J323 Chronic sphenoidal sinusitis: Secondary | ICD-10-CM | POA: Diagnosis not present

## 2019-09-13 DIAGNOSIS — J3489 Other specified disorders of nose and nasal sinuses: Secondary | ICD-10-CM | POA: Diagnosis not present

## 2019-09-13 NOTE — Progress Notes (Signed)
HPI: Franklin Rivera is a 69 y.o. male who returns today for evaluation of chronic right-sided headaches with opacification of right sphenoid sinus for over 8 months.  He has undergone medical therapy and had a follow up CT scan performed in May that demonstrated total opacification of the right sphenoid sinus with remaining sinuses clear. He states that he had nasal surgery when he was in the service.  Presently has more difficulty breathing through the right nostril.  He describes headaches mostly on top of his head always on the right side.[name] He takes medication for hepatitis C. Denies any diabetes or heart disease.  He is on no blood thinners. NKDA  Past Medical History:  Diagnosis Date  . Allergy   . Headache   . Hyperlipidemia    Past Surgical History:  Procedure Laterality Date  . BLADDER SURGERY    . NASAL FRACTURE SURGERY    . Skull Surgery     Social History   Socioeconomic History  . Marital status: Married    Spouse name: Not on file  . Number of children: Not on file  . Years of education: Not on file  . Highest education level: Not on file  Occupational History  . Not on file  Tobacco Use  . Smoking status: Never Smoker  . Smokeless tobacco: Never Used  Vaping Use  . Vaping Use: Never used  Substance and Sexual Activity  . Alcohol use: Never  . Drug use: Not Currently  . Sexual activity: Not on file  Other Topics Concern  . Not on file  Social History Narrative  . Not on file   Social Determinants of Health   Financial Resource Strain:   . Difficulty of Paying Living Expenses:   Food Insecurity:   . Worried About Charity fundraiser in the Last Year:   . Arboriculturist in the Last Year:   Transportation Needs:   . Film/video editor (Medical):   [name] Lack of Transportation (Non-Medical):   Physical Activity:   . Days of Exercise per Week:   . Minutes of Exercise per Session:   Stress:   . Feeling of Stress :   Social Connections:   . Frequency  of Communication with Friends and Family:   . Frequency of Social Gatherings with Friends and Family:   . Attends Religious Services:   . Active Member of Clubs or Organizations:   . Attends Archivist Meetings:   [name] Marital Status:    Family History  Problem Relation Age of Onset  . Cancer Neg Hx    No Known Allergies Prior to Admission medications   Medication Sig Start Date End Date Taking? Authorizing Provider  amoxicillin-clavulanate (AUGMENTIN) 875-125 MG tablet Take 1 tablet by mouth 2 (two) times daily. 09/02/19  Yes [name], MD  atorvastatin (LIPITOR) 40 MG tablet Take 1 tablet (40 mg total) by mouth daily. 05/23/19  Yes [name], MD  predniSONE (DELTASONE) 50 MG tablet Take 1 tablet daily for 5 days. 09/02/19  Yes [name], MD  Sofosbuvir-Velpatasvir (EPCLUSA) 400-100 MG TABS Take 1 tablet by mouth daily. Take 1 tablet by mouth daily. 07/04/19  Yes Golden Circle, FNP  tadalafil (CIALIS) 10 MG tablet Take 1 tablet (10 mg total) by mouth daily as needed for erectile dysfunction. 05/23/19  Yes [name], MD     Positive ROS: Otherwise negative  All other systems have been reviewed and were otherwise  negative with the exception of those mentioned in the HPI and as above.  Physical Exam: Constitutional: Alert, well-appearing, no acute distress Ears: External ears without lesions or tenderness. Ear canals are clear bilaterally with intact, clear TMs.  Nasal: External nose without lesions. Septum slightly deviated to the left.  Mild rhinitis.[name]  He has a large right inferior turbinate.  After decongesting the nose with Afrin right sided nasal endoscopy was performed.  No obvious mucopurulent discharge noted.  He had mild edema on the posterior right nasal cavity but no discrete polyps noted. Oral: Lips and gums without lesions. Tongue and palate mucosa without lesions. Posterior oropharynx clear. Neck: No palpable adenopathy or masses Respiratory:  Breathing comfortably  Lungs clear to auscultation Cardiac exam: Regular rate and rhythm without murmur Skin: No facial/neck lesions or rash noted.  Nasal/sinus endoscopy  Date/Time: 09/13/2019 3:17 PM Performed by: Rozetta Nunnery, MD Authorized by: Rozetta Nunnery, MD   Consent:    Consent obtained:  Verbal   Consent given by:  Patient Procedure details:    Indications: sino-nasal symptoms     Medication:  Afrin   Scope location: right nare   Nasal cavity:    Right inferior turbinates: normal     edema   Sinus:    Right middle meatus: normal     Right nasopharynx: normal   Comments:     On sinonasal endoscopy he has some edema of the right sphenoid region but no obvious mucopurulent discharge noted.    Assessment: Chronic right sphenoid sinus obstruction with secondary headaches.  Plan: I discussed with patient concerning surgery which would involve a right sphenoidotomy in order to irrigate and drain the right sphenoid sinus.  Probable posterior right ethmoidectomy and right inferior turbinate reduction to help improve nasal airway.   Radene Journey, MD

## 2019-09-28 ENCOUNTER — Encounter: Payer: Self-pay | Admitting: Pharmacist

## 2019-09-28 ENCOUNTER — Other Ambulatory Visit: Payer: Self-pay | Admitting: Pharmacist

## 2019-09-28 DIAGNOSIS — B182 Chronic viral hepatitis C: Secondary | ICD-10-CM

## 2019-10-10 ENCOUNTER — Other Ambulatory Visit (HOSPITAL_COMMUNITY)
Admission: RE | Admit: 2019-10-10 | Discharge: 2019-10-10 | Disposition: A | Discharge status: Home / Self Care | Payer: Medicare HMO | Source: Ambulatory Visit | Attending: Otolaryngology | Admitting: Otolaryngology

## 2019-10-10 DIAGNOSIS — Z01812 Encounter for preprocedural laboratory examination: Secondary | ICD-10-CM | POA: Insufficient documentation

## 2019-10-10 DIAGNOSIS — Z20822 Contact with and (suspected) exposure to covid-19: Secondary | ICD-10-CM | POA: Diagnosis not present

## 2019-10-10 LAB — SARS CORONAVIRUS 2 (TAT 6-24 HRS): SARS Coronavirus 2: NEGATIVE

## 2019-10-13 DIAGNOSIS — J322 Chronic ethmoidal sinusitis: Secondary | ICD-10-CM | POA: Diagnosis not present

## 2019-10-13 DIAGNOSIS — J323 Chronic sphenoidal sinusitis: Secondary | ICD-10-CM | POA: Diagnosis not present

## 2019-10-13 DIAGNOSIS — J343 Hypertrophy of nasal turbinates: Secondary | ICD-10-CM | POA: Diagnosis not present

## 2019-10-17 ENCOUNTER — Other Ambulatory Visit: Payer: Medicare HMO

## 2019-10-17 ENCOUNTER — Other Ambulatory Visit: Payer: Self-pay

## 2019-10-17 ENCOUNTER — Ambulatory Visit: Payer: Medicare HMO | Admitting: Pharmacist

## 2019-10-17 DIAGNOSIS — B182 Chronic viral hepatitis C: Secondary | ICD-10-CM

## 2019-10-19 LAB — HEPATITIS C RNA QUANTITATIVE
HCV Quantitative Log: <1.18 Log IU/mL
HCV RNA, PCR, QN: <15 IU/mL

## 2019-10-21 ENCOUNTER — Ambulatory Visit (INDEPENDENT_AMBULATORY_CARE_PROVIDER_SITE_OTHER): Payer: Medicare HMO | Admitting: Otolaryngology

## 2019-10-21 ENCOUNTER — Other Ambulatory Visit: Payer: Self-pay

## 2019-10-21 ENCOUNTER — Encounter (INDEPENDENT_AMBULATORY_CARE_PROVIDER_SITE_OTHER): Payer: Self-pay | Admitting: Otolaryngology

## 2019-10-21 VITALS — Temp 97.0°F

## 2019-10-21 DIAGNOSIS — Z4889 Encounter for other specified surgical aftercare: Secondary | ICD-10-CM

## 2019-10-21 NOTE — Progress Notes (Signed)
HPI: Franklin Rivera is a 69 y.o. male who presents 8 days s/p left posterior ethmoidectomy and left sphenoidotomy for chronic left sphenoid sinusitis.  He is breathing better and the headache has resolved..   Past Medical History:  Diagnosis Date  . Allergy   . Headache   . Hyperlipidemia    Past Surgical History:  Procedure Laterality Date  . BLADDER SURGERY    . NASAL FRACTURE SURGERY    . Skull Surgery     Social History   Socioeconomic History  . Marital status: Married    Spouse name: Not on file  . Number of children: Not on file  . Years of education: Not on file  . Highest education level: Not on file  Occupational History  . Not on file  Tobacco Use  . Smoking status: Never Smoker  . Smokeless tobacco: Never Used  Vaping Use  . Vaping Use: Never used  Substance and Sexual Activity  . Alcohol use: Never  . Drug use: Not Currently  . Sexual activity: Not on file  Other Topics Concern  . Not on file  Social History Narrative  . Not on file   Social Determinants of Health   Financial Resource Strain:   . Difficulty of Paying Living Expenses:   Food Insecurity:   . Worried About Charity fundraiser in the Last Year:   . Arboriculturist in the Last Year:   Transportation Needs:   . Film/video editor (Medical):   [name] Lack of Transportation (Non-Medical):   Physical Activity:   . Days of Exercise per Week:   . Minutes of Exercise per Session:   Stress:   . Feeling of Stress :   Social Connections:   . Frequency of Communication with Friends and Family:   . Frequency of Social Gatherings with Friends and Family:   . Attends Religious Services:   . Active Member of Clubs or Organizations:   . Attends Archivist Meetings:   [name] Marital Status:    Family History  Problem Relation Age of Onset  . Cancer Neg Hx    No Known Allergies Prior to Admission medications   Medication Sig Start Date End Date Taking? Authorizing Provider  atorvastatin  (LIPITOR) 40 MG tablet Take 1 tablet (40 mg total) by mouth daily. 05/23/19  Yes [name], MD  Sofosbuvir-Velpatasvir (EPCLUSA) 400-100 MG TABS Take 1 tablet by mouth daily. Take 1 tablet by mouth daily. 07/04/19  Yes Golden Circle, FNP  tadalafil (CIALIS) 10 MG tablet Take 1 tablet (10 mg total) by mouth daily as needed for erectile dysfunction. 05/23/19  Yes [name], MD  amoxicillin-clavulanate (AUGMENTIN) 875-125 MG tablet Take 1 tablet by mouth 2 (two) times daily. Patient not taking: Reported on 10/21/2019 09/02/19   [name], MD  predniSONE (DELTASONE) 50 MG tablet Take 1 tablet daily for 5 days. Patient not taking: Reported on 10/21/2019 09/02/19   [name], MD     Physical Exam: He has some crusting in the posterior ethmoid area but the sphenoidotomy is patent.  There is no active drainage noted and has had no bleeding problems.   Assessment: S/p left posterior ethmoidectomy and left sphenoidotomy with reduction of left middle turbinate.  Plan: He has done well with the surgery with resolution of his headache. He will follow-up in 2 to 3 weeks for recheck.   Radene Journey, MD

## 2019-11-10 ENCOUNTER — Other Ambulatory Visit: Payer: Self-pay

## 2019-11-10 ENCOUNTER — Ambulatory Visit (INDEPENDENT_AMBULATORY_CARE_PROVIDER_SITE_OTHER): Payer: Medicare HMO | Admitting: Otolaryngology

## 2019-11-10 VITALS — Temp 97.3°F

## 2019-11-10 DIAGNOSIS — Z4889 Encounter for other specified surgical aftercare: Secondary | ICD-10-CM

## 2019-11-10 NOTE — Progress Notes (Signed)
HPI: Franklin Rivera is a 69 y.o. male who presents 24 days s/p left sphenoidotomy, left posterior ethmoidectomy and reduction of left middle turbinate.  He is doing well with no further headaches.  He has been using saline spray.  He has had no drainage problems..   Past Medical History:  Diagnosis Date   Allergy    Headache    Hyperlipidemia    Past Surgical History:  Procedure Laterality Date   BLADDER SURGERY     NASAL FRACTURE SURGERY     Skull Surgery     Social History   Socioeconomic History   Marital status: Married    Spouse name: Not on file   Number of children: Not on file   Years of education: Not on file   Highest education level: Not on file  Occupational History   Not on file  Tobacco Use   Smoking status: Never Smoker   Smokeless tobacco: Never Used  Vaping Use   Vaping Use: Never used  Substance and Sexual Activity   Alcohol use: Never   Drug use: Not Currently   Sexual activity: Not on file  Other Topics Concern   Not on file  Social History Narrative   Not on file   Social Determinants of Health   Financial Resource Strain:    Difficulty of Paying Living Expenses:   Food Insecurity:    Worried About Charity fundraiser in the Last Year:    Arboriculturist in the Last Year:   Transportation Needs:    Film/video editor (Medical):    Lack of Transportation (Non-Medical):   Physical Activity:    Days of Exercise per Week:    Minutes of Exercise per Session:   Stress:    Feeling of Stress :   Social Connections:    Frequency of Communication with Friends and Family:    Frequency of Social Gatherings with Friends and Family:    Attends Religious Services:    Active Member of Clubs or Organizations:    Attends Music therapist:    Marital Status:    Family History  Problem Relation Age of Onset   Cancer Neg Hx    No Known Allergies Prior to Admission medications   Medication Sig Start  Date End Date Taking? Authorizing Provider  amoxicillin-clavulanate (AUGMENTIN) 875-125 MG tablet Take 1 tablet by mouth 2 (two) times daily. 09/02/19  Yes [name], MD  atorvastatin (LIPITOR) 40 MG tablet Take 1 tablet (40 mg total) by mouth daily. 05/23/19  Yes [name], MD  predniSONE (DELTASONE) 50 MG tablet Take 1 tablet daily for 5 days. 09/02/19  Yes [name], MD  Sofosbuvir-Velpatasvir (EPCLUSA) 400-100 MG TABS Take 1 tablet by mouth daily. Take 1 tablet by mouth daily. 07/04/19  Yes Golden Circle, FNP  tadalafil (CIALIS) 10 MG tablet Take 1 tablet (10 mg total) by mouth daily as needed for erectile dysfunction. 05/23/19  Yes [name], MD     Physical Exam: Has a small crust on the left lower turbinate that was removed in the office today otherwise sphenoidotomy is patent and everything appears clear with no evidence of infection.   Assessment: S/p left sphenoidotomy, left posterior ethmoidectomy and left middle turbinate reduction.  Plan: He will follow up on a as needed basis.   Radene Journey, MD

## 2020-01-02 ENCOUNTER — Ambulatory Visit (INDEPENDENT_AMBULATORY_CARE_PROVIDER_SITE_OTHER): Payer: Medicare HMO

## 2020-01-02 ENCOUNTER — Other Ambulatory Visit: Payer: Self-pay

## 2020-01-02 ENCOUNTER — Ambulatory Visit (INDEPENDENT_AMBULATORY_CARE_PROVIDER_SITE_OTHER): Payer: Medicare HMO | Admitting: Family Medicine

## 2020-01-02 ENCOUNTER — Ambulatory Visit: Payer: Self-pay

## 2020-01-02 ENCOUNTER — Encounter: Payer: Self-pay | Admitting: Family Medicine

## 2020-01-02 VITALS — BP 120/80 | HR 61 | Ht 72.0 in | Wt 214.2 lb

## 2020-01-02 DIAGNOSIS — M19071 Primary osteoarthritis, right ankle and foot: Secondary | ICD-10-CM | POA: Diagnosis not present

## 2020-01-02 DIAGNOSIS — G8929 Other chronic pain: Secondary | ICD-10-CM

## 2020-01-02 DIAGNOSIS — M79671 Pain in right foot: Secondary | ICD-10-CM

## 2020-01-02 NOTE — Progress Notes (Signed)
    Subjective:    CC: R heel pain  I, Molly Weber, LAT, ATC, am serving as scribe for Dr. Lynne Leader.  HPI: Pt is a 69 y/o male presenting w/ c/o chronic R heel pain that has worsened over the past 2 weeks.  He locates his pain to his R post/plantar heel.  He has a hx of fracturing his heel when he fell off a ladder about 10 years.  He currently works in Architect.  Swelling: yes intermittently Aggravating factors: Prolonged standing/walking Treatments tried: Nothing  Pertinent review of Systems: No fevers or chills  Relevant historical information: History of hep C infection.  Works as a Theme park manager.  History right heel fracture around 10 years ago.   Objective:    Vitals:   01/02/20 1115  BP: 120/80  Pulse: 61  SpO2: 95%   General: Well Developed, well nourished, and in no acute distress.   MSK: Left heel largely normal-appearing slightly pushed posterior otherwise normal-appearing Tender palpation plantar medial calcaneus.  Normal foot and ankle motion and strength.  Pulses cap refill and sensation are intact distally.  Lab and Radiology Results  X-ray images right os calcis obtained today personally and independently interpreted Abnormal appearance of calcaneus.  The plantar calcaneus is pushed further back and flattened out a bit.  Likely secondary to the original fracture.  No acute fracture line visible.  Significant degenerative changes present in the subtalar joint especially posterior. Await formal radiology review  Diagnostic Limited MSK Ultrasound of: Right plantar calcaneus Plantar fascia attaches to plantar calcaneus that area of maximum tenderness normal in appearance otherwise. Impression: Plantar fasciitis    Impression and Recommendations:    Assessment and Plan: 69 y.o. male with plantar calcaneal pain in the setting of remote calcaneal fracture.  The fracture likely resulted in a fundamental change in shape of his calcaneus.  Not sure if this is  a risk factor for plantar fasciitis but plantar fasciitis seems to be the main issue today.  Plan to treat with eccentric exercises cushioning and ice.  If not better recheck in 6 weeks.  PDMP not reviewed this encounter. Orders Placed This Encounter  Procedures  . [place] LIMITED JOINT SPACE STRUCTURES LOW RIGHT(NO LINKED CHARGES)    Order Specific Question:   Reason for Exam (SYMPTOM  OR DIAGNOSIS REQUIRED)    Answer:   R heel pain    Order Specific Question:   Preferred imaging location?    Answer:   [place]  . DG Os Calcis Right    Standing Status:   Future    Standing Expiration Date:   01/01/2021    Order Specific Question:   Reason for Exam (SYMPTOM  OR DIAGNOSIS REQUIRED)    Answer:   eval calcaneough prior fracture    Order Specific Question:   Preferred imaging location?    Answer:   [facility]   No orders of the defined types were placed in this encounter.   Discussed warning signs or symptoms. Please see discharge instructions. Patient expresses understanding.   The above documentation has been reviewed and is accurate and complete Lynne Leader, M.D.

## 2020-01-02 NOTE — Patient Instructions (Addendum)
Thank you for coming in today.  Please get an Xray today before you leave  Do the heel exercises.  Go from up to down slowly 30 reps 2-x daily.   House shoes. Crocs?  Ice bath in the evening.   Gel heel cups.  Recheck back in 4-6 weeks if not improving.    Plantar Fasciitis  Plantar fasciitis is a painful foot condition that affects the heel. It occurs when the band of tissue that connects the toes to the heel bone (plantar fascia) becomes irritated. This can happen as the result of exercising too much or doing other repetitive activities (overuse injury). The pain from plantar fasciitis can range from mild irritation to severe pain that makes it difficult to walk or move. The pain is usually worse in the morning after sleeping, or after sitting or lying down for a while. Pain may also be worse after long periods of walking or standing. What are the causes? This condition may be caused by:  Standing for long periods of time.  Wearing shoes that do not have good arch support.  Doing activities that put stress on joints (high-impact activities), including running, aerobics, and ballet.  Being overweight.  An abnormal way of walking (gait).  Tight muscles in the back of your lower leg (calf).  High arches in your feet.  Starting a new athletic activity. What are the signs or symptoms? The main symptom of this condition is heel pain. Pain may:  Be worse with first steps after a time of rest, especially in the morning after sleeping or after you have been sitting or lying down for a while.  Be worse after long periods of standing still.  Decrease after 30-45 minutes of activity, such as gentle walking. How is this diagnosed? This condition may be diagnosed based on your medical history and your symptoms. Your health care provider may ask questions about your activity level. Your health care provider will do a physical exam to check for:  A tender area on the bottom of your  foot.  A high arch in your foot.  Pain when you move your foot.  Difficulty moving your foot. You may have imaging tests to confirm the diagnosis, such as:  X-rays.  Ultrasound.  MRI. How is this treated? Treatment for plantar fasciitis depends on how severe your condition is. Treatment may include:  Rest, ice, applying pressure (compression), and raising the affected foot (elevation). This may be called RICE therapy. Your health care provider may recommend RICE therapy along with over-the-counter pain medicines to manage your pain.  Exercises to stretch your calves and your plantar fascia.  A splint that holds your foot in a stretched, upward position while you sleep (night splint).  Physical therapy to relieve symptoms and prevent problems in the future.  Injections of steroid medicine (cortisone) to relieve pain and inflammation.  Stimulating your plantar fascia with electrical impulses (extracorporeal shock wave therapy). This is usually the last treatment option before surgery.  Surgery, if other treatments have not worked after 12 months. Follow these instructions at home:  Managing pain, stiffness, and swelling  If directed, put ice on the painful area: ? Put ice in a plastic bag, or use a frozen bottle of water. ? Place a towel between your skin and the bag or bottle. ? Roll the bottom of your foot over the bag or bottle. ? Do this for 20 minutes, 2-3 times a day.  Wear athletic shoes that have air-sole or  gel-sole cushions, or try wearing soft shoe inserts that are designed for plantar fasciitis.  Raise (elevate) your foot above the level of your heart while you are sitting or lying down. Activity  Avoid activities that cause pain. Ask your health care provider what activities are safe for you.  Do physical therapy exercises and stretches as told by your health care provider.  Try activities and forms of exercise that are easier on your joints (low-impact).  Examples include swimming, water aerobics, and biking. General instructions  Take over-the-counter and prescription medicines only as told by your health care provider.  Wear a night splint while sleeping, if told by your health care provider. Loosen the splint if your toes tingle, become numb, or turn cold and blue.  Maintain a healthy weight, or work with your health care provider to lose weight as needed.  Keep all follow-up visits as told by your health care provider. This is important. Contact a health care provider if you:  Have symptoms that do not go away after caring for yourself at home.  Have pain that gets worse.  Have pain that affects your ability to move or do your daily activities. Summary  Plantar fasciitis is a painful foot condition that affects the heel. It occurs when the band of tissue that connects the toes to the heel bone (plantar fascia) becomes irritated.  The main symptom of this condition is heel pain that may be worse after exercising too much or standing still for a long time.  Treatment varies, but it usually starts with rest, ice, compression, and elevation (RICE therapy) and over-the-counter medicines to manage pain. This information is not intended to replace advice given to you by your health care provider. Make sure you discuss any questions you have with your health care provider. Document Revised: 02/27/2017 Document Reviewed: 01/12/2017 Elsevier Patient Education  2020 [employer]. .

## 2020-01-03 NOTE — Progress Notes (Signed)
X-ray shows arthritis at the very back of the foot otherwise looks okay to radiology

## 2020-02-03 NOTE — Progress Notes (Signed)
   I, Wendy Poet, LAT, ATC, am serving as scribe for Dr. Lynne Leader.  Franklin Rivera is a 69 y.o. male who presents to [facility] today for f/u of chronic R heel pain.  He was last seen by Dr. Georgina Snell on 01/02/20 and was shown a HEP by Dr. Georgina Snell consisting of Alfredson's exercises.  He was advised to use/wear house shoes to provide some extra support.  Since his last visit, pt reports pn when standing for long periods of time. Pn along the plantar aspect of R foot. Pt is compliant c HEP.  Overall he thinks he is improved.  He does think that the reduced wear-and-tear in his feet with the reduced job demands at the wintertime is not helping.  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  Diagnostic imaging: R heel XR- 01/02/20   Pertinent review of systems: No fevers or chills  Relevant historical information: History of calcaneus fracture.   Exam:  BP (!) 148/88 (BP Location: Right Arm, Patient Position: Sitting, Cuff Size: Normal)   Pulse 71   Ht 6' (1.829 m)   Wt 213 lb 6.4 oz (96.8 kg)   SpO2 97%   BMI 28.94 kg/m  General: Well Developed, well nourished, and in no acute distress.   MSK: Right calcaneus nontender plantar aspect and posterior aspect.    Lab and Radiology Results  EXAM: RIGHT OS CALCIS - 2+ VIEW  COMPARISON:  None.  FINDINGS: No evidence of acute fracture. No dislocation. Moderate arthropathy involving the posterior subtalar joint with joint space narrowing and prominent dorsal osteophytosis. Mild calcaneocuboid joint space narrowing. Degenerative changes are also noted at the talonavicular joint with a large spur emanating dorsally from the talar head. Soft tissues unremarkable.  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 No acute findings.   Electronically Signed   By: Davina Poke D.O.   On: 01/02/2020 16:41 I, Lynne Leader, personally (independently)  visualized and performed the interpretation of the images attached in this note.    Assessment and Plan: 69 y.o. male with right heel pain.  Patient has a history of significant calcaneus fracture which is now healed.  However his calcaneus is a bit oddly shaped which I think is causing some of the plantar fascial pain or plantar calcaneus pain.  Ultimately he is only can I have some much benefit to conservative management and is not a good surgical candidate.  Discussed maximizing cushioning and home exercise program.  Could do plantar calcaneus injection with steroids at any time however would recommend waiting on that until his pain worsens possibly the summertime.  Recheck back as needed.  He is satisfied with this plan.    Discussed warning signs or symptoms. Please see discharge instructions. Patient expresses understanding.   The above documentation has been reviewed and is accurate and complete Lynne Leader, M.D.  Total encounter time 20 minutes including face-to-face time with the patient and, reviewing past medical record, and charting on the date of service.   Treatment plan and options.

## 2020-02-06 ENCOUNTER — Other Ambulatory Visit: Payer: Self-pay

## 2020-02-06 ENCOUNTER — Ambulatory Visit (INDEPENDENT_AMBULATORY_CARE_PROVIDER_SITE_OTHER): Payer: Medicare HMO | Admitting: Family Medicine

## 2020-02-06 ENCOUNTER — Encounter: Payer: Self-pay | Admitting: Family Medicine

## 2020-02-06 VITALS — BP 148/88 | HR 71 | Ht 72.0 in | Wt 213.4 lb

## 2020-02-06 DIAGNOSIS — G8929 Other chronic pain: Secondary | ICD-10-CM

## 2020-02-06 DIAGNOSIS — M79671 Pain in right foot: Secondary | ICD-10-CM

## 2020-02-06 NOTE — Patient Instructions (Signed)
Thank you for coming in today.  Plan for cushion, and strength exercises.  Can do an injection in the future if needed.  Could also consider custom cushioned orthotics.  Recheck as needed.

## 2020-04-03 ENCOUNTER — Encounter: Payer: Self-pay | Admitting: Family Medicine

## 2020-04-03 ENCOUNTER — Ambulatory Visit (INDEPENDENT_AMBULATORY_CARE_PROVIDER_SITE_OTHER): Payer: Medicare HMO | Admitting: Family Medicine

## 2020-04-03 ENCOUNTER — Other Ambulatory Visit: Payer: Self-pay

## 2020-04-03 VITALS — BP 126/77 | HR 77 | Temp 97.8°F | Ht 72.0 in | Wt 209.6 lb

## 2020-04-03 DIAGNOSIS — E785 Hyperlipidemia, unspecified: Secondary | ICD-10-CM

## 2020-04-03 DIAGNOSIS — J329 Chronic sinusitis, unspecified: Secondary | ICD-10-CM | POA: Diagnosis not present

## 2020-04-03 DIAGNOSIS — N529 Male erectile dysfunction, unspecified: Secondary | ICD-10-CM

## 2020-04-03 MED ORDER — BENZONATATE 200 MG PO CAPS
200.0000 mg | ORAL_CAPSULE | Freq: Two times a day (BID) | ORAL | 0 refills | Status: DC | PRN
Start: 2020-04-03 — End: 2020-05-25

## 2020-04-03 MED ORDER — AZELASTINE HCL 0.1 % NA SOLN
2.0000 | Freq: Two times a day (BID) | NASAL | 12 refills | Status: DC
Start: 2020-04-03 — End: 2021-01-08

## 2020-04-03 MED ORDER — TADALAFIL 10 MG PO TABS: 10.0000 mg | ORAL_TABLET | Freq: Every day | ORAL | 0 refills | Status: DC | PRN

## 2020-04-03 NOTE — Assessment & Plan Note (Signed)
Continue Lipitor 40 mg daily.  He will come back soon for CPE and we will recheck lipids at that time.

## 2020-04-03 NOTE — Progress Notes (Signed)
   Franklin Rivera is a 70 y.o. male who presents today for an office visit.  Assessment/Plan:  Chronic Problems Addressed Today: Chronic sinusitis Recent flare.  Nearly resolved with Sudafed.  Will start Astelin and Tessalon for lingering postnasal drip and cough.  Discussed reasons to return to care.  Dyslipidemia Continue Lipitor 40 mg daily.  He will come back soon for CPE and we will recheck lipids at that time.  Erectile dysfunction Stable.  Continue Cialis 10 mg daily.  Refill sent in today.     Subjective:  HPI:  Patient with headache for the last 6 days.  Was concerned about chronic sinus infection.  Went to [facility].  Pickup Sudafed at the recommendation of pharmacist.  Symptoms have resolved over the last day.  No reported weakness or numbness.  No reported vision changes.  Has a history of chronic sinusitis.  Has seen ENT in the past.   He is otherwise doing well.  See A/P for status of chronic conditions.        Objective:  Physical Exam: BP 126/77   Pulse 77   Temp 97.8 F (36.6 C) (Temporal)   Ht 6' (1.829 m)   Wt 209 lb 9.6 oz (95.1 kg)   SpO2 99%   BMI 28.43 kg/m   Gen: No acute distress, resting comfortably CV: Regular rate and rhythm with no murmurs appreciated Pulm: Normal work of breathing, clear to auscultation bilaterally with no crackles, wheezes, or rhonchi Neuro: Grossly normal, moves all extremities Psych: Normal affect and thought content      Franklin Franklin M. Jimmey Ralph, MD 04/03/2020 9:13 AM

## 2020-04-03 NOTE — Assessment & Plan Note (Signed)
Stable.  Continue Cialis 10 mg daily.  Refill sent in today.

## 2020-04-03 NOTE — Assessment & Plan Note (Signed)
Recent flare.  Nearly resolved with Sudafed.  Will start Astelin and Tessalon for lingering postnasal drip and cough.  Discussed reasons to return to care.

## 2020-04-03 NOTE — Patient Instructions (Signed)
It was very nice to see you today!  Please start the Tessalon and Astelin.  I will refill your Cialis.  Please come back to see me in a couple of months for your annual physical with blood work.  Please come back to see me sooner if needed.  Take care, Dr Jimmey Ralph  Please try these tips to maintain a healthy lifestyle:   Eat at least 3 REAL meals and 1-2 snacks per day.  Aim for no more than 5 hours between eating.  If you eat breakfast, please do so within one hour of getting up.    Each meal should contain half fruits/vegetables, one quarter protein, and one quarter carbs (no bigger than a computer mouse)   Cut down on sweet beverages. This includes juice, soda, and sweet tea.     Drink at least 1 glass of water with each meal and aim for at least 8 glasses per day   Exercise at least 150 minutes every week.

## 2020-05-25 ENCOUNTER — Ambulatory Visit (INDEPENDENT_AMBULATORY_CARE_PROVIDER_SITE_OTHER): Payer: Medicare HMO | Admitting: Family Medicine

## 2020-05-25 ENCOUNTER — Other Ambulatory Visit: Payer: Self-pay

## 2020-05-25 ENCOUNTER — Encounter: Payer: Self-pay | Admitting: Family Medicine

## 2020-05-25 VITALS — BP 150/94 | HR 78 | Temp 97.3°F | Ht 72.0 in | Wt 196.2 lb

## 2020-05-25 DIAGNOSIS — N529 Male erectile dysfunction, unspecified: Secondary | ICD-10-CM | POA: Diagnosis not present

## 2020-05-25 DIAGNOSIS — Z6826 Body mass index (BMI) 26.0-26.9, adult: Secondary | ICD-10-CM | POA: Diagnosis not present

## 2020-05-25 DIAGNOSIS — E663 Overweight: Secondary | ICD-10-CM

## 2020-05-25 DIAGNOSIS — R739 Hyperglycemia, unspecified: Secondary | ICD-10-CM

## 2020-05-25 DIAGNOSIS — Z125 Encounter for screening for malignant neoplasm of prostate: Secondary | ICD-10-CM | POA: Diagnosis not present

## 2020-05-25 DIAGNOSIS — E785 Hyperlipidemia, unspecified: Secondary | ICD-10-CM | POA: Diagnosis not present

## 2020-05-25 DIAGNOSIS — Z0001 Encounter for general adult medical examination with abnormal findings: Secondary | ICD-10-CM

## 2020-05-25 LAB — CBC
HCT: 47.3 % (ref 39.0–52.0)
Hemoglobin: 15.4 g/dL (ref 13.0–17.0)
MCHC: 32.5 g/dL (ref 30.0–36.0)
MCV: 87.7 fl (ref 78.0–100.0)
Platelets: 157 10*3/uL (ref 150.0–400.0)
RBC: 5.39 Mil/uL (ref 4.22–5.81)
RDW: 13.9 % (ref 11.5–15.5)
WBC: 2.4 10*3/uL — ABNORMAL LOW (ref 4.0–10.5)

## 2020-05-25 LAB — COMPREHENSIVE METABOLIC PANEL
ALT: 12 U/L (ref 0–53)
AST: 16 U/L (ref 0–37)
Albumin: 4.3 g/dL (ref 3.5–5.2)
Alkaline Phosphatase: 71 U/L (ref 39–117)
BUN: 8 mg/dL (ref 6–23)
CO2: 33 mEq/L — ABNORMAL HIGH (ref 19–32)
Calcium: 9.2 mg/dL (ref 8.4–10.5)
Chloride: 100 mEq/L (ref 96–112)
Creatinine, Ser: 0.83 mg/dL (ref 0.40–1.50)
GFR: 89.67 mL/min (ref >60.00)
Glucose, Bld: 92 mg/dL (ref 70–99)
Potassium: 4.9 mEq/L (ref 3.5–5.1)
Sodium: 138 mEq/L (ref 135–145)
Total Bilirubin: 0.3 mg/dL (ref 0.2–1.2)
Total Protein: 7.9 g/dL (ref 6.0–8.3)

## 2020-05-25 LAB — LIPID PANEL
Cholesterol: 160 mg/dL (ref 0–200)
HDL: 41.1 mg/dL (ref >39.00)
LDL Cholesterol: 97 mg/dL (ref 0–99)
NonHDL: 118.87
Total CHOL/HDL Ratio: 4
Triglycerides: 107 mg/dL (ref 0.0–149.0)
VLDL: 21.4 mg/dL (ref 0.0–40.0)

## 2020-05-25 LAB — HEMOGLOBIN A1C: Hgb A1c MFr Bld: 5.9 % (ref 4.6–6.5)

## 2020-05-25 LAB — PSA: PSA: 0.64 ng/mL (ref 0.10–4.00)

## 2020-05-25 LAB — VITAMIN B12: Vitamin B-12: 1250 pg/mL — ABNORMAL HIGH (ref 211–911)

## 2020-05-25 LAB — TSH: TSH: 0.81 u[IU]/mL (ref 0.35–4.50)

## 2020-05-25 MED ORDER — TADALAFIL 10 MG PO TABS: 10.0000 mg | ORAL_TABLET | Freq: Every day | ORAL | 0 refills | Status: DC | PRN

## 2020-05-25 NOTE — Progress Notes (Signed)
Chief Complaint:  Franklin Rivera is a 70 y.o. male who presents today for his annual comprehensive physical exam.    Assessment/Plan:  Chronic Problems Addressed Today: Hyperglycemia Check A1c.  Erectile dysfunction Stable.  Refill Cialis.  Dyslipidemia Stable.  Continue Lipitor 40 mg daily.  Will check labs today.  Body mass index is 26.61 kg/m. / Overweight  BMI Metric Follow Up - 05/25/20 0901      BMI Metric Follow Up-Please document annually   BMI Metric Follow Up Education provided           Preventative Healthcare: Up-to-date on colon cancer screening.  Will check labs today.  Deferred vaccines.  Patient Counseling(The following topics were reviewed and/or handout was given):  -Nutrition: Stressed importance of moderation in sodium/caffeine intake, saturated fat and cholesterol, caloric balance, sufficient intake of fresh fruits, vegetables, and fiber.  -Stressed the importance of regular exercise.   -Substance Abuse: Discussed cessation/primary prevention of tobacco, alcohol, or other drug use; driving or other dangerous activities under the influence; availability of treatment for abuse.   -Injury prevention: Discussed safety belts, safety helmets, smoke detector, smoking near bedding or upholstery.   -Sexuality: Discussed sexually transmitted diseases, partner selection, use of condoms, avoidance of unintended pregnancy and contraceptive alternatives.   -Dental health: Discussed importance of regular tooth brushing, flossing, and dental visits.  -Health maintenance and immunizations reviewed. Please refer to Health maintenance section.  Return to care in 1 year for next preventative visit.     Subjective:  HPI:  He has no acute complaints today.   Lifestyle Diet: Plenty of fruits and vegetables.  Exercise: Busy with work.   Depression screen [facility] 2/9 07/11/2019  Decreased Interest 0  Down, Depressed, Hopeless 0  PHQ - 2 Score 0  Altered sleeping -   Tired, decreased energy -  Change in appetite -  Feeling bad or failure about yourself  -  Trouble concentrating -  Moving slowly or fidgety/restless -  Suicidal thoughts -  PHQ-9 Score -  Difficult doing work/chores -   There are no preventive care reminders to display for this patient.   ROS: Per HPI, otherwise a complete review of systems was negative.   PMH:  The following were reviewed and entered/updated in epic: Past Medical History:  Diagnosis Date  . Allergy   . Headache   . Hyperlipidemia    Patient Active Problem List   Diagnosis Date Noted  . Hepatitis C virus infection without hepatic coma 06/06/2019  . Hyperglycemia 05/24/2019  . Erectile dysfunction 05/23/2019  . Chronic sinusitis 04/21/2019  . Dyslipidemia 01/20/2019  . Frontal headache 01/18/2019  . Insomnia 01/18/2019   Past Surgical History:  Procedure Laterality Date  . BLADDER SURGERY    . NASAL FRACTURE SURGERY    . Skull Surgery      Family History  Problem Relation Age of Onset  . Cancer Neg Hx     Medications- reviewed and updated Current Outpatient Medications  Medication Sig Dispense Refill  . atorvastatin (LIPITOR) 40 MG tablet Take 1 tablet (40 mg total) by mouth daily. 90 tablet 3  . azelastine (ASTELIN) 0.1 % nasal spray Place 2 sprays into both nostrils 2 (two) times daily. 30 mL 12  . tadalafil (CIALIS) 10 MG tablet Take 1 tablet (10 mg total) by mouth daily as needed for erectile dysfunction. 30 tablet 0   No current facility-administered medications for this visit.    Allergies-reviewed and updated No Known Allergies  Social  History   Socioeconomic History  . Marital status: Married    Spouse name: Not on file  . Number of children: Not on file  . Years of education: Not on file  . Highest education level: Not on file  Occupational History  . Not on file  Tobacco Use  . Smoking status: Never Smoker  . Smokeless tobacco: Never Used  Vaping Use  . Vaping Use:  Never used  Substance and Sexual Activity  . Alcohol use: Never  . Drug use: Not Currently  . Sexual activity: Not on file  Other Topics Concern  . Not on file  Social History Narrative  . Not on file   Social Determinants of Health   Financial Resource Strain: Not on file  Food Insecurity: Not on file  Transportation Needs: Not on file  Physical Activity: Not on file  Stress: Not on file  Social Connections: Not on file        Objective:  Physical Exam: BP (!) 150/94   Pulse 78   Temp (!) 97.3 F (36.3 C) (Temporal)   Ht 6' (1.829 m)   Wt 196 lb 3.2 oz (89 kg)   SpO2 100%   BMI 26.61 kg/m   Body mass index is 26.61 kg/m. Wt Readings from Last 3 Encounters:  05/25/20 196 lb 3.2 oz (89 kg)  04/03/20 209 lb 9.6 oz (95.1 kg)  02/06/20 213 lb 6.4 oz (96.8 kg)   Gen: NAD, resting comfortably HEENT: TMs normal bilaterally. OP clear. No thyromegaly noted.  CV: RRR with no murmurs appreciated Pulm: NWOB, CTAB with no crackles, wheezes, or rhonchi GI: Normal bowel sounds present. Soft, Nontender, Nondistended. MSK: no edema, cyanosis, or clubbing noted Skin: warm, dry Neuro: CN2-12 grossly intact. Strength 5/5 in upper and lower extremities. Reflexes symmetric and intact bilaterally.  Psych: Normal affect and thought content     Caleb M. Jerline Pain, MD 05/25/2020 9:01 AM

## 2020-05-25 NOTE — Assessment & Plan Note (Signed)
Stable.  Continue Lipitor 40 mg daily.  Will check labs today.

## 2020-05-25 NOTE — Assessment & Plan Note (Signed)
Check A1c. 

## 2020-05-25 NOTE — Patient Instructions (Signed)
It was very nice to see you today!  We will check blood work.  Please continue the good work with your diet and exercise.  I will see you back in a year.  Please come back to see me sooner if needed.  Take care, Dr Jerline Pain  Please try these tips to maintain a healthy lifestyle:   Eat at least 3 REAL meals and 1-2 snacks per day.  Aim for no more than 5 hours between eating.  If you eat breakfast, please do so within one hour of getting up.    Each meal should contain half fruits/vegetables, one quarter protein, and one quarter carbs (no bigger than a computer mouse)   Cut down on sweet beverages. This includes juice, soda, and sweet tea.     Drink at least 1 glass of water with each meal and aim for at least 8 glasses per day   Exercise at least 150 minutes every week.    Preventive Care 27 Years and Older, Male Preventive care refers to lifestyle choices and visits with your health care provider that can promote health and wellness. This includes:  A yearly physical exam. This is also called an annual wellness visit.  Regular dental and eye exams.  Immunizations.  Screening for certain conditions.  Healthy lifestyle choices, such as: ? Eating a healthy diet. ? Getting regular exercise. ? Not using drugs or products that contain nicotine and tobacco. ? Limiting alcohol use. What can I expect for my preventive care visit? Physical exam Your health care provider will check your:  Height and weight. These may be used to calculate your BMI (body mass index). BMI is a measurement that tells if you are at a healthy weight.  Heart rate and blood pressure.  Body temperature.  Skin for abnormal spots. Counseling Your health care provider may ask you questions about your:  Past medical problems.  Family's medical history.  Alcohol, tobacco, and drug use.  Emotional well-being.  Home life and relationship well-being.  Sexual activity.  Diet, exercise, and  sleep habits.  History of falls.  Memory and ability to understand (cognition).  Work and work Statistician.  Access to firearms. What immunizations do I need? Vaccines are usually given at various ages, according to a schedule. Your health care provider will recommend vaccines for you based on your age, medical history, and lifestyle or other factors, such as travel or where you work.   What tests do I need? Blood tests  Lipid and cholesterol levels. These may be checked every 5 years, or more often depending on your overall health.  Hepatitis C test.  Hepatitis B test. Screening  Lung cancer screening. You may have this screening every year starting at age 57 if you have a 30-pack-year history of smoking and currently smoke or have quit within the past 15 years.  Colorectal cancer screening. ? All adults should have this screening starting at age 68 and continuing until age 59. ? Your health care provider may recommend screening at age 11 if you are at increased risk. ? You will have tests every 1-10 years, depending on your results and the type of screening test.  Prostate cancer screening. Recommendations will vary depending on your family history and other risks.  Genital exam to check for testicular cancer or hernias.  Diabetes screening. ? This is done by checking your blood sugar (glucose) after you have not eaten for a while (fasting). ? You may have this done every 1-3  years.  Abdominal aortic aneurysm (AAA) screening. You may need this if you are a current or former smoker.  STD (sexually transmitted disease) testing, if you are at risk. Follow these instructions at home: Eating and drinking  Eat a diet that includes fresh fruits and vegetables, whole grains, lean protein, and low-fat dairy products. Limit your intake of foods with high amounts of sugar, saturated fats, and salt.  Take vitamin and mineral supplements as recommended by your health care  provider.  Do not drink alcohol if your health care provider tells you not to drink.  If you drink alcohol: ? Limit how much you have to 0-2 drinks a day. ? Be aware of how much alcohol is in your drink. In the [place] one drink equals one 12 oz bottle of beer (355 mL), one 5 oz glass of wine (148 mL), or one 1 oz glass of hard liquor (44 mL).   Lifestyle  Take daily care of your teeth and gums. Brush your teeth every morning and night with fluoride toothpaste. Floss one time each day.  Stay active. Exercise for at least 30 minutes 5 or more days each week.  Do not use any products that contain nicotine or tobacco, such as cigarettes, e-cigarettes, and chewing tobacco. If you need help quitting, ask your health care provider.  Do not use drugs.  If you are sexually active, practice safe sex. Use a condom or other form of protection to prevent STIs (sexually transmitted infections).  Talk with your health care provider about taking a low-dose aspirin or statin.  Find healthy ways to cope with stress, such as: ? Meditation, yoga, or listening to music. ? Journaling. ? Talking to a trusted person. ? Spending time with friends and family. Safety  Always wear your seat belt while driving or riding in a vehicle.  Do not drive: ? If you have been drinking alcohol. Do not ride with someone who has been drinking. ? When you are tired or distracted. ? While texting.  Wear a helmet and other protective equipment during sports activities.  If you have firearms in your house, make sure you follow all gun safety procedures. What's next?  Visit your health care provider once a year for an annual wellness visit.  Ask your health care provider how often you should have your eyes and teeth checked.  Stay up to date on all vaccines. This information is not intended to replace advice given to you by your health care provider. Make sure you discuss any questions you have with your health care  provider. Document Revised: 12/14/2018 Document Reviewed: 03/11/2018 Elsevier Patient Education  2021 [employer].

## 2020-05-25 NOTE — Assessment & Plan Note (Signed)
Stable.  Refill Cialis.

## 2020-05-29 NOTE — Progress Notes (Signed)
Please inform patient of the following:  Labs are all stable. Do not need to make any changes to his treatment plan at this time. Would like for him to keep working on diet and exercise and we can recheck in a year.  Algis Greenhouse. Jerline Pain, MD 05/29/2020 3:39 PM

## 2020-05-30 ENCOUNTER — Telehealth: Payer: Self-pay

## 2020-05-30 NOTE — Telephone Encounter (Signed)
Patient called back for lab results stated he will back around 1

## 2020-05-30 NOTE — Telephone Encounter (Signed)
Patient is returning a call about lab results, requesting a call back about 130 PM.

## 2020-05-30 NOTE — Telephone Encounter (Signed)
Lab results send via MyChart

## 2020-05-30 NOTE — Telephone Encounter (Signed)
Information send on MyChart

## 2020-06-15 DIAGNOSIS — R69 Illness, unspecified: Secondary | ICD-10-CM | POA: Diagnosis not present

## 2020-08-17 ENCOUNTER — Other Ambulatory Visit: Payer: Self-pay | Admitting: *Deleted

## 2020-08-17 ENCOUNTER — Telehealth: Payer: Self-pay

## 2020-08-17 MED ORDER — CETIRIZINE HCL 10 MG PO TABS
10.0000 mg | ORAL_TABLET | Freq: Every day | ORAL | 3 refills | Status: DC
Start: 2020-08-17 — End: 2020-10-23

## 2020-08-17 NOTE — Telephone Encounter (Signed)
See note

## 2020-08-17 NOTE — Telephone Encounter (Signed)
Rx send to [facility]  

## 2020-08-17 NOTE — Telephone Encounter (Signed)
Patient states he is having trouble with his allergies and has been taking Zertec but cant afford it but patient states if you send in a prescription to [facility] his insurance  for some allergy medication then medication will be free of charge can we send him some allergy medication to [place], [place]  Please advise

## 2020-08-17 NOTE — Telephone Encounter (Signed)
Ok to send in cetirizine 10mg  daily.   Algis Greenhouse. Jerline Pain, MD 08/17/2020 10:01 AM

## 2020-08-20 ENCOUNTER — Ambulatory Visit (INDEPENDENT_AMBULATORY_CARE_PROVIDER_SITE_OTHER): Payer: Medicare HMO

## 2020-08-20 DIAGNOSIS — Z0000 Encounter for general adult medical examination without abnormal findings: Secondary | ICD-10-CM | POA: Diagnosis not present

## 2020-08-20 NOTE — Patient Instructions (Addendum)
Franklin Rivera , Thank you for taking time to come for your Medicare Wellness Visit. I appreciate your ongoing commitment to your health goals. Please review the following plan we discussed and let me know if I can assist you in the future.   Screening recommendations/referrals: Colonoscopy: Done 06/06/19 cologuard  Recommended yearly ophthalmology/optometry visit for glaucoma screening and checkup Recommended yearly dental visit for hygiene and checkup  Vaccinations: Influenza vaccine: Declined  Pneumococcal vaccine: Declined  Tdap vaccine: Up to date per pt  Shingles vaccine: Shingrix discussed. Please contact your pharmacy for coverage information.    Covid-19: Declined and discussed   Advanced directives: Advance directive discussed with you today. Even though you declined this today please call our office should you change your mind and we can give you the proper paperwork for you to fill out.  Conditions/risks identified: None at this time   Next appointment: Follow up in one year for your annual wellness visit.   Preventive Care 69 Years and Older, Male Preventive care refers to lifestyle choices and visits with your health care provider that can promote health and wellness. What does preventive care include?  A yearly physical exam. This is also called an annual well check.  Dental exams once or twice a year.  Routine eye exams. Ask your health care provider how often you should have your eyes checked.  Personal lifestyle choices, including:  Daily care of your teeth and gums.  Regular physical activity.  Eating a healthy diet.  Avoiding tobacco and drug use.  Limiting alcohol use.  Practicing safe sex.  Taking low doses of aspirin every day.  Taking vitamin and mineral supplements as recommended by your health care provider. What happens during an annual well check? The services and screenings done by your health care provider during your annual well check will  depend on your age, overall health, lifestyle risk factors, and family history of disease. Counseling  Your health care provider may ask you questions about your:  Alcohol use.  Tobacco use.  Drug use.  Emotional well-being.  Home and relationship well-being.  Sexual activity.  Eating habits.  History of falls.  Memory and ability to understand (cognition).  Work and work Statistician. Screening  You may have the following tests or measurements:  Height, weight, and BMI.  Blood pressure.  Lipid and cholesterol levels. These may be checked every 5 years, or more frequently if you are over 9 years old.  Skin check.  Lung cancer screening. You may have this screening every year starting at age 11 if you have a 30-pack-year history of smoking and currently smoke or have quit within the past 15 years.  Fecal occult blood test (FOBT) of the stool. You may have this test every year starting at age 2.  Flexible sigmoidoscopy or colonoscopy. You may have a sigmoidoscopy every 5 years or a colonoscopy every 10 years starting at age 48.  Prostate cancer screening. Recommendations will vary depending on your family history and other risks.  Hepatitis C blood test.  Hepatitis B blood test.  Sexually transmitted disease (STD) testing.  Diabetes screening. This is done by checking your blood sugar (glucose) after you have not eaten for a while (fasting). You may have this done every 1-3 years.  Abdominal aortic aneurysm (AAA) screening. You may need this if you are a current or former smoker.  Osteoporosis. You may be screened starting at age 35 if you are at high risk. Talk with your health care  provider about your test results, treatment options, and if necessary, the need for more tests. Vaccines  Your health care provider may recommend certain vaccines, such as:  Influenza vaccine. This is recommended every year.  Tetanus, diphtheria, and acellular pertussis (Tdap,  Td) vaccine. You may need a Td booster every 10 years.  Zoster vaccine. You may need this after age 45.  Pneumococcal 13-valent conjugate (PCV13) vaccine. One dose is recommended after age 57.  Pneumococcal polysaccharide (PPSV23) vaccine. One dose is recommended after age 35. Talk to your health care provider about which screenings and vaccines you need and how often you need them. This information is not intended to replace advice given to you by your health care provider. Make sure you discuss any questions you have with your health care provider. Document Released: 04/13/2015 Document Revised: 12/05/2015 Document Reviewed: 01/16/2015 Elsevier Interactive Patient Education  2017 [place] Prevention in the Home Falls can cause injuries. They can happen to people of all ages. There are many things you can do to make your home safe and to help prevent falls. What can I do on the outside of my home?  Regularly fix the edges of walkways and driveways and fix any cracks.  Remove anything that might make you trip as you walk through a door, such as a raised step or threshold.  Trim any bushes or trees on the path to your home.  Use bright outdoor lighting.  Clear any walking paths of anything that might make someone trip, such as rocks or tools.  Regularly check to see if handrails are loose or broken. Make sure that both sides of any steps have handrails.  Any raised decks and porches should have guardrails on the edges.  Have any leaves, snow, or ice cleared regularly.  Use sand or salt on walking paths during winter.  Clean up any spills in your garage right away. This includes oil or grease spills. What can I do in the bathroom?  Use night lights.  Install grab bars by the toilet and in the tub and shower. Do not use towel bars as grab bars.  Use non-skid mats or decals in the tub or shower.  If you need to sit down in the shower, use a plastic, non-slip  stool.  Keep the floor dry. Clean up any water that spills on the floor as soon as it happens.  Remove soap buildup in the tub or shower regularly.  Attach bath mats securely with double-sided non-slip rug tape.  Do not have throw rugs and other things on the floor that can make you trip. What can I do in the bedroom?  Use night lights.  Make sure that you have a light by your bed that is easy to reach.  Do not use any sheets or blankets that are too big for your bed. They should not hang down onto the floor.  Have a firm chair that has side arms. You can use this for support while you get dressed.  Do not have throw rugs and other things on the floor that can make you trip. What can I do in the kitchen?  Clean up any spills right away.  Avoid walking on wet floors.  Keep items that you use a lot in easy-to-reach places.  If you need to reach something above you, use a strong step stool that has a grab bar.  Keep electrical cords out of the way.  Do not use floor polish  or wax that makes floors slippery. If you must use wax, use non-skid floor wax.  Do not have throw rugs and other things on the floor that can make you trip. What can I do with my stairs?  Do not leave any items on the stairs.  Make sure that there are handrails on both sides of the stairs and use them. Fix handrails that are broken or loose. Make sure that handrails are as long as the stairways.  Check any carpeting to make sure that it is firmly attached to the stairs. Fix any carpet that is loose or worn.  Avoid having throw rugs at the top or bottom of the stairs. If you do have throw rugs, attach them to the floor with carpet tape.  Make sure that you have a light switch at the top of the stairs and the bottom of the stairs. If you do not have them, ask someone to add them for you. What else can I do to help prevent falls?  Wear shoes that:  Do not have high heels.  Have rubber bottoms.  Are  comfortable and fit you well.  Are closed at the toe. Do not wear sandals.  If you use a stepladder:  Make sure that it is fully opened. Do not climb a closed stepladder.  Make sure that both sides of the stepladder are locked into place.  Ask someone to hold it for you, if possible.  Clearly mark and make sure that you can see:  Any grab bars or handrails.  First and last steps.  Where the edge of each step is.  Use tools that help you move around (mobility aids) if they are needed. These include:  Canes.  Walkers.  Scooters.  Crutches.  Turn on the lights when you go into a dark area. Replace any light bulbs as soon as they burn out.  Set up your furniture so you have a clear path. Avoid moving your furniture around.  If any of your floors are uneven, fix them.  If there are any pets around you, be aware of where they are.  Review your medicines with your doctor. Some medicines can make you feel dizzy. This can increase your chance of falling. Ask your doctor what other things that you can do to help prevent falls. This information is not intended to replace advice given to you by your health care provider. Make sure you discuss any questions you have with your health care provider. Document Released: 01/11/2009 Document Revised: 08/23/2015 Document Reviewed: 04/21/2014 Elsevier Interactive Patient Education  2017 [employer].

## 2020-08-20 NOTE — Progress Notes (Signed)
Virtual Visit via Telephone Note  I connected with  Franklin Rivera on 08/20/20 at  8:00 AM EDT by telephone and verified that I am speaking with the correct person using two identifiers.  Medicare Annual Wellness visit completed telephonically due to Covid-19 pandemic.   Persons participating in this call: This Health Coach and this patient.   Location: Patient: Home Provider: Office    I discussed the limitations, risks, security and privacy concerns of performing an evaluation and management service by telephone and the availability of in person appointments. The patient expressed understanding and agreed to proceed.  Unable to perform video visit due to video visit attempted and failed and/or patient does not have video capability.   Some vital signs may be absent or patient reported.   Marzella Schlein, LPN    Subjective:   Franklin Rivera is a 70 y.o. male who presents for Medicare Annual/Subsequent preventive examination.  Review of Systems     Cardiac Risk Factors include: advanced age (>61men, >35 women);dyslipidemia;male gender     Objective:    There were no vitals filed for this visit. There is no height or weight on file to calculate BMI.  Advanced Directives 08/20/2020 07/11/2019  Does Patient Have a Medical Advance Directive? No No  Would patient like information on creating a medical advance directive? No - Patient declined Yes (MAU/Ambulatory/Procedural Areas - Information given)    Current Medications (verified) Outpatient Encounter Medications as of 08/20/2020  Medication Sig  . atorvastatin (LIPITOR) 40 MG tablet Take 1 tablet (40 mg total) by mouth daily.  [name] azelastine (ASTELIN) 0.1 % nasal spray Place 2 sprays into both nostrils 2 (two) times daily.  . cetirizine (ZYRTEC) 10 MG tablet Take 1 tablet (10 mg total) by mouth daily.  . tadalafil (CIALIS) 10 MG tablet Take 1 tablet (10 mg total) by mouth daily as needed for erectile dysfunction.   No  facility-administered encounter medications on file as of 08/20/2020.    Allergies (verified) Patient has no known allergies.   History: Past Medical History:  Diagnosis Date  . Allergy   . Headache   . Hyperlipidemia    Past Surgical History:  Procedure Laterality Date  . BLADDER SURGERY    . NASAL FRACTURE SURGERY    . Skull Surgery     Family History  Problem Relation Age of Onset  . Cancer Neg Hx    Social History   Socioeconomic History  . Marital status: Married    Spouse name: Not on file  . Number of children: Not on file  . Years of education: Not on file  . Highest education level: Not on file  Occupational History  . Not on file  Tobacco Use  . Smoking status: Never Smoker  . Smokeless tobacco: Never Used  Vaping Use  . Vaping Use: Never used  Substance and Sexual Activity  . Alcohol use: Never  . Drug use: Not Currently  . Sexual activity: Not on file  Other Topics Concern  . Not on file  Social History Narrative  . Not on file   Social Determinants of Health   Financial Resource Strain: Low Risk   . Difficulty of Paying Living Expenses: Not hard at all  Food Insecurity: No Food Insecurity  . Worried About Programme researcher, broadcasting/film/video in the Last Year: Never true  . Ran Out of Food in the Last Year: Never true  Transportation Needs: No Transportation Needs  . Lack of Transportation (  Medical): No  . Lack of Transportation (Non-Medical): No  Physical Activity: Inactive  . Days of Exercise per Week: 0 days  . Minutes of Exercise per Session: 0 min  Stress: No Stress Concern Present  . Feeling of Stress : Not at all  Social Connections: Moderately Isolated  . Frequency of Communication with Friends and Family: More than three times a week  . Frequency of Social Gatherings with Friends and Family: More than three times a week  . Attends Religious Services: Never  . Active Member of Clubs or Organizations: No  . Attends Archivist Meetings:  Never  . Marital Status: Married    Tobacco Counseling Counseling given: Not Answered   Clinical Intake:  Pre-visit preparation completed: Yes  Pain : No/denies pain     BMI - recorded: 26.61 Nutritional Status: BMI 25 -29 Overweight Nutritional Risks: None Diabetes: No  How often do you need to have someone help you when you read instructions, pamphlets, or other written materials from your doctor or pharmacy?: 1 - Never  Diabetic?No  Interpreter Needed?: No  Information entered by :: Charlott Rakes, LPN   Activities of Daily Living In your present state of health, do you have any difficulty performing the following activities: 08/20/2020  Hearing? N  Vision? N  Difficulty concentrating or making decisions? N  Walking or climbing stairs? N  Dressing or bathing? N  Doing errands, shopping? N  Preparing Food and eating ? N  Using the Toilet? N  In the past six months, have you accidently leaked urine? N  Do you have problems with loss of bowel control? N  Managing your Medications? N  Managing your Finances? N  Housekeeping or managing your Housekeeping? N  Some recent data might be hidden    Patient Care Team: [name], MD as PCP - General (Family Medicine) Golden Circle, FNP as Nurse Practitioner (Family Medicine) [facility] as Consulting Physician Rozetta Nunnery, MD as Consulting Physician (Otolaryngology)  Indicate any recent Medical Services you may have received from other than [facility] providers in the past year (date may be approximate).     Assessment:   This is a routine wellness examination for [name].  Hearing/Vision screen  Hearing Screening   125Hz  250Hz  500Hz  1000Hz  2000Hz  3000Hz  4000Hz  6000Hz  8000Hz   Right ear:           Left ear:           Comments: Pt denies any hearing issues   Vision Screening Comments: Pt follows up with provider for annual eye exams unsure fo practice name   Dietary issues and  exercise activities discussed: Current Exercise Habits: The patient has a physically strenuous job, but has no regular exercise apart from work.  Goals Addressed            This Visit's Progress   . Patient Stated       None at this time       Depression Screen PHQ 2/9 Scores 08/20/2020 07/11/2019 06/24/2019 05/23/2019 01/18/2019  PHQ - 2 Score 0 0 0 0 0  PHQ- 9 Score - - - - 5    Fall Risk Fall Risk  08/20/2020 07/11/2019 06/24/2019 05/23/2019 01/18/2019  Falls in the past year? 0 0 0 0 0  Number falls in past yr: 0 0 - - -  Injury with Fall? 0 0 - - -  Follow up Falls prevention discussed Falls evaluation completed;Education provided;Falls prevention discussed Falls  evaluation completed - -    FALL RISK PREVENTION PERTAINING TO THE HOME:  Any stairs in or around the home? Yes  If so, are there any without handrails? No  Home free of loose throw rugs in walkways, pet beds, electrical cords, etc? Yes  Adequate lighting in your home to reduce risk of falls? Yes   ASSISTIVE DEVICES UTILIZED TO PREVENT FALLS:  Life alert? Yes  Use of a cane, walker or w/c? No  Grab bars in the bathroom? Yes  Shower chair or bench in shower? Yes  Elevated toilet seat or a handicapped toilet? No   TIMED UP AND GO:  Was the test performed? No .      Cognitive Function:     6CIT Screen 08/20/2020 07/11/2019  What Year? 0 points 0 points  What month? 0 points 0 points  What time? - 0 points  Count back from 20 0 points 0 points  Months in reverse 0 points 0 points  Repeat phrase 2 points 0 points  Total Score - 0    Immunizations  There is no immunization history on file for this patient.  TDAP status: Up to date  Per pt  Flu Vaccine status: Declined, Education has been provided regarding the importance of this vaccine but patient still declined. Advised may receive this vaccine at local pharmacy or Health Dept. Aware to provide a copy of the vaccination record if obtained from local  pharmacy or Health Dept. Verbalized acceptance and understanding.  Pneumococcal vaccine status: Declined,  Education has been provided regarding the importance of this vaccine but patient still declined. Advised may receive this vaccine at local pharmacy or Health Dept. Aware to provide a copy of the vaccination record if obtained from local pharmacy or Health Dept. Verbalized acceptance and understanding.   Covid-19 vaccine status: Declined, Education has been provided regarding the importance of this vaccine but patient still declined. Advised may receive this vaccine at local pharmacy or Health Dept.or vaccine clinic. Aware to provide a copy of the vaccination record if obtained from local pharmacy or Health Dept. Verbalized acceptance and understanding.  Qualifies for Shingles Vaccine? Yes   Zostavax completed No   Shingrix Completed?: No.    Education has been provided regarding the importance of this vaccine. Patient has been advised to call insurance company to determine out of pocket expense if they have not yet received this vaccine. Advised may also receive vaccine at local pharmacy or Health Dept. Verbalized acceptance and understanding.  Screening Tests Health Maintenance  Topic Date Due  . COVID-19 Vaccine (1) Never done  . PNA vac Low Risk Adult (1 of 2 - PCV13) 04/03/2021 (Originally 06/23/2016)  . INFLUENZA VACCINE  10/29/2020  . Fecal DNA (Cologuard)  06/06/2022  . TETANUS/TDAP  12/19/2024  . Hepatitis C Screening  Completed  . HPV VACCINES  Aged Out    Health Maintenance  Health Maintenance Due  Topic Date Due  . COVID-19 Vaccine (1) Never done    Colorectal cancer screening: Type of screening: Cologuard. Completed 06/06/19. Repeat every 3 years  Additional Screening:  Hepatitis C Screening: Completed 10/17/19  Vision Screening: Recommended annual ophthalmology exams for early detection of glaucoma and other disorders of the eye. Is the patient up to date with their  annual eye exam?  Yes  Who is the provider or what is the name of the office in which the patient attends annual eye exams?  Dr Katy Fitch  If pt is not established with  a provider, would they like to be referred to a provider to establish care? No .   Dental Screening: Recommended annual dental exams for proper oral hygiene  Community Resource Referral / Chronic Care Management: CRR required this visit?  No   CCM required this visit?  No      Plan:     I have personally reviewed and noted the following in the patient's chart:   . Medical and social history . Use of alcohol, tobacco or illicit drugs  . Current medications and supplements including opioid prescriptions. Patient is not currently taking opioid prescriptions. . Functional ability and status . Nutritional status . Physical activity . Advanced directives . List of other physicians . Hospitalizations, surgeries, and ER visits in previous 12 months . Vitals . Screenings to include cognitive, depression, and falls . Referrals and appointments  In addition, I have reviewed and discussed with patient certain preventive protocols, quality metrics, and best practice recommendations. A written personalized care plan for preventive services as well as general preventive health recommendations were provided to patient.     Willette Brace, LPN   11/29/5174   Nurse Notes: None

## 2020-08-21 ENCOUNTER — Other Ambulatory Visit: Payer: Self-pay | Admitting: Family Medicine

## 2020-10-23 ENCOUNTER — Other Ambulatory Visit: Payer: Self-pay | Admitting: Family Medicine

## 2021-01-08 ENCOUNTER — Ambulatory Visit (INDEPENDENT_AMBULATORY_CARE_PROVIDER_SITE_OTHER): Payer: Medicare HMO | Admitting: Family Medicine

## 2021-01-08 ENCOUNTER — Other Ambulatory Visit: Payer: Self-pay

## 2021-01-08 ENCOUNTER — Encounter: Payer: Self-pay | Admitting: Family Medicine

## 2021-01-08 VITALS — BP 125/75 | HR 82 | Temp 98.7°F | Ht 72.0 in | Wt 194.6 lb

## 2021-01-08 DIAGNOSIS — G4700 Insomnia, unspecified: Secondary | ICD-10-CM | POA: Diagnosis not present

## 2021-01-08 DIAGNOSIS — J329 Chronic sinusitis, unspecified: Secondary | ICD-10-CM | POA: Diagnosis not present

## 2021-01-08 MED ORDER — CETIRIZINE HCL 10 MG PO TABS: 10.0000 mg | ORAL_TABLET | Freq: Every day | ORAL | 0 refills | Status: DC

## 2021-01-08 MED ORDER — HYDROXYZINE HCL 50 MG PO TABS: 50.0000 mg | ORAL_TABLET | Freq: Every evening | ORAL | 0 refills | Status: DC | PRN

## 2021-01-08 NOTE — Assessment & Plan Note (Signed)
Symptoms have begun worsen again over the last few months.  Recommended he follow back up with ENT. He had sinus surgery done last year.

## 2021-01-08 NOTE — Progress Notes (Signed)
   Franklin Rivera is a 70 y.o. male who presents today for an office visit.  Assessment/Plan:  Chronic Problems Addressed Today: Chronic sinusitis Symptoms have begun worsen again over the last few months.  Recommended he follow back up with ENT. He had sinus surgery done last year.   Insomnia Having difficulty with sleep maintenance insomnia.  Has not had much success with conservative management.  We discussed treatment options.  We will start hydroxyzine.  This may be able to help some with his rhinitis/sinusitis as well.  Discussed potential side effects.  He will check in with me in a few weeks via MyChart.     Subjective:  HPI:  He complain of severe headaches. This has been ongoing for a month. He has had similar issue in the past. Usually occur in the morning. Symptoms resolved after sleeping at night. He also reports headache affect his ear. He noticed ringing in the ears. He has a history of chronic sinusitis.  Denies vision changes.   He also have difficulty time sleeping. This issue has be going on for a while. He did not tried any medication before. He would like to start on Hydroxyzine for this problem. He is requesting a refill on Zyrtec as well.         Objective:  Physical Exam: BP 125/75   Pulse 82   Temp 98.7 F (37.1 C) (Temporal)   Ht 6' (1.829 m)   Wt 194 lb 9.6 oz (88.3 kg)   SpO2 98%   BMI 26.39 kg/m   Gen: No acute distress, resting comfortably CV: Regular rate and rhythm with no murmurs appreciated Pulm: Normal work of breathing, clear to auscultation bilaterally with no crackles, wheezes, or rhonchi Neuro: Grossly normal, moves all extremities Psych: Normal affect and thought content       I,Savera Zaman,acting as a scribe for Dimas Chyle, MD.,have documented all relevant documentation on the behalf of Dimas Chyle, MD,as directed by  Dimas Chyle, MD while in the presence of Dimas Chyle, MD.   I, Dimas Chyle, MD, have reviewed all  documentation for this visit. The documentation on 01/08/21 for the exam, diagnosis, procedures, and orders are all accurate and complete.  Algis Greenhouse. Jerline Pain, MD 01/08/2021 1:46 PM

## 2021-01-08 NOTE — Patient Instructions (Signed)
It was very nice to see you today!  Please schedule appointment with ENT soon.  We will start hydroxyzine.  Please send a message in a few weeks to let me know how this is working for you.  Take care, Dr Jerline Pain  PLEASE NOTE:  If you had any lab tests please let us know if you have not heard back within a few days. You may see your results on mychart before we have a chance to review them but we will give you a call once they are reviewed by [place]. If we ordered any referrals today, please let us know if you have not heard from their office within the next week.   Please try these tips to maintain a healthy lifestyle:  Eat at least 3 REAL meals and 1-2 snacks per day.  Aim for no more than 5 hours between eating.  If you eat breakfast, please do so within one hour of getting up.   Each meal should contain half fruits/vegetables, one quarter protein, and one quarter carbs (no bigger than a computer mouse)  Cut down on sweet beverages. This includes juice, soda, and sweet tea.   Drink at least 1 glass of water with each meal and aim for at least 8 glasses per day  Exercise at least 150 minutes every week.

## 2021-01-08 NOTE — Assessment & Plan Note (Signed)
Having difficulty with sleep maintenance insomnia.  Has not had much success with conservative management.  We discussed treatment options.  We will start hydroxyzine.  This may be able to help some with his rhinitis/sinusitis as well.  Discussed potential side effects.  He will check in with me in a few weeks via MyChart.

## 2021-01-11 ENCOUNTER — Other Ambulatory Visit: Payer: Self-pay

## 2021-01-11 ENCOUNTER — Emergency Department (HOSPITAL_COMMUNITY)
Admission: EM | Admit: 2021-01-11 | Discharge: 2021-01-11 | Disposition: A | Discharge status: Home / Self Care | Payer: Medicare HMO | Attending: Emergency Medicine | Admitting: Emergency Medicine

## 2021-01-11 ENCOUNTER — Emergency Department (HOSPITAL_COMMUNITY): Payer: Medicare HMO

## 2021-01-11 ENCOUNTER — Encounter (HOSPITAL_COMMUNITY): Payer: Self-pay | Admitting: Emergency Medicine

## 2021-01-11 ENCOUNTER — Telehealth: Payer: Self-pay

## 2021-01-11 DIAGNOSIS — R160 Hepatomegaly, not elsewhere classified: Secondary | ICD-10-CM

## 2021-01-11 DIAGNOSIS — Z20822 Contact with and (suspected) exposure to covid-19: Secondary | ICD-10-CM | POA: Diagnosis not present

## 2021-01-11 DIAGNOSIS — B182 Chronic viral hepatitis C: Secondary | ICD-10-CM | POA: Diagnosis not present

## 2021-01-11 DIAGNOSIS — I700 Atherosclerosis of aorta: Secondary | ICD-10-CM | POA: Diagnosis not present

## 2021-01-11 DIAGNOSIS — R531 Weakness: Secondary | ICD-10-CM | POA: Diagnosis not present

## 2021-01-11 DIAGNOSIS — R918 Other nonspecific abnormal finding of lung field: Secondary | ICD-10-CM | POA: Diagnosis not present

## 2021-01-11 DIAGNOSIS — M25519 Pain in unspecified shoulder: Secondary | ICD-10-CM | POA: Diagnosis not present

## 2021-01-11 DIAGNOSIS — J189 Pneumonia, unspecified organism: Secondary | ICD-10-CM | POA: Diagnosis not present

## 2021-01-11 DIAGNOSIS — R1011 Right upper quadrant pain: Secondary | ICD-10-CM

## 2021-01-11 DIAGNOSIS — R9431 Abnormal electrocardiogram [ECG] [EKG]: Secondary | ICD-10-CM | POA: Diagnosis not present

## 2021-01-11 DIAGNOSIS — J181 Lobar pneumonia, unspecified organism: Secondary | ICD-10-CM | POA: Insufficient documentation

## 2021-01-11 DIAGNOSIS — K449 Diaphragmatic hernia without obstruction or gangrene: Secondary | ICD-10-CM | POA: Diagnosis not present

## 2021-01-11 DIAGNOSIS — K7689 Other specified diseases of liver: Secondary | ICD-10-CM | POA: Diagnosis not present

## 2021-01-11 LAB — CBC WITH DIFFERENTIAL/PLATELET
Abs Immature Granulocytes: 0.01 10*3/uL (ref 0.00–0.07)
Basophils Absolute: 0 10*3/uL (ref 0.0–0.1)
Basophils Relative: 0 %
Eosinophils Absolute: 0.1 10*3/uL (ref 0.0–0.5)
Eosinophils Relative: 1 %
HCT: 40.6 % (ref 39.0–52.0)
Hemoglobin: 13.2 g/dL (ref 13.0–17.0)
Immature Granulocytes: 0 %
Lymphocytes Relative: 22 %
Lymphs Abs: 1.6 10*3/uL (ref 0.7–4.0)
MCH: 28.8 pg (ref 26.0–34.0)
MCHC: 32.5 g/dL (ref 30.0–36.0)
MCV: 88.6 fL (ref 80.0–100.0)
Monocytes Absolute: 0.4 10*3/uL (ref 0.1–1.0)
Monocytes Relative: 6 %
Neutro Abs: 5.1 10*3/uL (ref 1.7–7.7)
Neutrophils Relative %: 71 %
Platelets: 208 10*3/uL (ref 150–400)
RBC: 4.58 MIL/uL (ref 4.22–5.81)
RDW: 13.1 % (ref 11.5–15.5)
WBC: 7.2 10*3/uL (ref 4.0–10.5)
nRBC: 0 % (ref 0.0–0.2)

## 2021-01-11 LAB — LACTIC ACID, PLASMA
Lactic Acid, Venous: 2.3 mmol/L (ref 0.5–1.9)
Lactic Acid, Venous: 3.2 mmol/L (ref 0.5–1.9)
Lactic Acid, Venous: 1.6 mmol/L (ref 0.5–1.9)

## 2021-01-11 LAB — URINALYSIS, ROUTINE W REFLEX MICROSCOPIC
Bilirubin Urine: NEGATIVE
Glucose, UA: NEGATIVE mg/dL
Hgb urine dipstick: NEGATIVE
Ketones, ur: NEGATIVE mg/dL
Leukocytes,Ua: NEGATIVE
Nitrite: NEGATIVE
Protein, ur: NEGATIVE mg/dL
Specific Gravity, Urine: 1.016 (ref 1.005–1.030)
pH: 6 (ref 5.0–8.0)

## 2021-01-11 LAB — COMPREHENSIVE METABOLIC PANEL
ALT: 32 U/L (ref 0–44)
AST: 26 U/L (ref 15–41)
Albumin: 3.5 g/dL (ref 3.5–5.0)
Alkaline Phosphatase: 57 U/L (ref 38–126)
Anion gap: 9 (ref 5–15)
BUN: 13 mg/dL (ref 8–23)
CO2: 22 mmol/L (ref 22–32)
Calcium: 8.7 mg/dL — ABNORMAL LOW (ref 8.9–10.3)
Chloride: 102 mmol/L (ref 98–111)
Creatinine, Ser: 0.81 mg/dL (ref 0.61–1.24)
GFR, Estimated: >60 mL/min (ref >60)
Glucose, Bld: 144 mg/dL — ABNORMAL HIGH (ref 70–99)
Potassium: 4 mmol/L (ref 3.5–5.1)
Sodium: 133 mmol/L — ABNORMAL LOW (ref 135–145)
Total Bilirubin: 0.6 mg/dL (ref 0.3–1.2)
Total Protein: 6.9 g/dL (ref 6.5–8.1)

## 2021-01-11 LAB — RESP PANEL BY RT-PCR (FLU A&B, COVID) ARPGX2
Influenza A by PCR: NEGATIVE
Influenza B by PCR: NEGATIVE
SARS Coronavirus 2 by RT PCR: NEGATIVE

## 2021-01-11 LAB — LIPASE, BLOOD: Lipase: 25 U/L (ref 11–51)

## 2021-01-11 LAB — TROPONIN I (HIGH SENSITIVITY)
Troponin I (High Sensitivity): 4 ng/L (ref <18)
Troponin I (High Sensitivity): 5 ng/L (ref <18)

## 2021-01-11 LAB — PROTIME-INR
INR: 1 (ref 0.8–1.2)
Prothrombin Time: 13.4 seconds (ref 11.4–15.2)

## 2021-01-11 LAB — CBG MONITORING, ED: Glucose-Capillary: 144 mg/dL — ABNORMAL HIGH (ref 70–99)

## 2021-01-11 MED ORDER — AZITHROMYCIN 250 MG PO TABS
500.0000 mg | ORAL_TABLET | Freq: Once | ORAL | Status: AC
  Administered 2021-01-11: 500 mg via ORAL
  Filled 2021-01-11: qty 2

## 2021-01-11 MED ORDER — AZITHROMYCIN 250 MG PO TABS: 250.0000 mg | ORAL_TABLET | Freq: Every day | ORAL | 0 refills | Status: DC

## 2021-01-11 MED ORDER — OXYCODONE-ACETAMINOPHEN 5-325 MG PO TABS
1.0000 | ORAL_TABLET | Freq: Four times a day (QID) | ORAL | 0 refills | Status: DC | PRN
Start: 2021-01-11 — End: 2021-02-12

## 2021-01-11 MED ORDER — SODIUM CHLORIDE 0.9 % IV BOLUS
1000.0000 mL | Freq: Once | INTRAVENOUS | Status: AC
  Administered 2021-01-11: 1000 mL via INTRAVENOUS

## 2021-01-11 MED ORDER — GADOBUTROL 1 MMOL/ML IV SOLN
8.0000 mL | Freq: Once | INTRAVENOUS | Status: AC | PRN
  Administered 2021-01-11: 8 mL via INTRAVENOUS

## 2021-01-11 NOTE — ED Provider Notes (Addendum)
Emergency Medicine Provider Triage Evaluation Note  Franklin Rivera , a 70 y.o. male  was evaluated in triage.  Pt complains of RUQ pain.  Review of Systems  Positive: RUQ pain, radiates to R shoulder, nausea, felt hot Negative: Cp, sob, cough, dysuria  Physical Exam  BP (!) 134/95   Pulse 92   Resp 16   Ht 6' (1.829 m)   Wt 88.2 kg   SpO2 100%   BMI 26.37 kg/m  Gen:   Awake, no distress   Resp:  Normal effort  MSK:   Moves extremities without difficulty  Other:  RUQ ttp  Medical Decision Making  Medically screening exam initiated at 2:24 PM.  Appropriate orders placed.  [name] was informed that the remainder of the evaluation will be completed by another provider, this initial triage assessment does not replace that evaluation, and the importance of remaining in the ED until their evaluation is complete.  Acute onset of RUQ pain started this AM, felt nauseous, and hot.  Pain radiates to R shoulder.  Has intact gallbladder.  Took hydroxyzine last nite to help with sleep.  Have been anxious lately.   2:37 PM Pt became diaphoretic, lightheadedness, and still endorse RUQ pain.  Denies back pain.  ?vasal vagal.  Will transfer to a room for close management. CBG 140s   Domenic Moras, PA-C 01/11/21 1425    Domenic Moras, PA-C 01/11/21 1439    Elnora Baumgartner, MD 01/16/21 [phone]

## 2021-01-11 NOTE — ED Provider Notes (Signed)
[name]   CSN: [id number] Arrival date & time: 01/11/21  1413     History Chief Complaint  Patient presents with   Abdominal Pain   Shoulder Pain    Franklin Rivera is a 70 y.o. male. PMH significant for    Abdominal Pain Associated symptoms: chills and nausea   Associated symptoms: no chest pain, no constipation, no cough, no diarrhea, no dysuria, no fatigue, no fever, no hematuria, no shortness of breath, no sore throat and no vomiting   Shoulder Pain Associated symptoms: no back pain, no fatigue and no fever       Past Medical History:  Diagnosis Date   Allergy    Headache    Hyperlipidemia     Patient Active Problem List   Diagnosis Date Noted   Hepatitis C virus infection without hepatic coma 06/06/2019   Hyperglycemia 05/24/2019   Erectile dysfunction 05/23/2019   Chronic sinusitis 04/21/2019   Dyslipidemia 01/20/2019   Frontal headache 01/18/2019   Insomnia 01/18/2019    Past Surgical History:  Procedure Laterality Date   BLADDER SURGERY     NASAL FRACTURE SURGERY     Skull Surgery         Family History  Problem Relation Age of Onset   Cancer Neg Hx     Social History   Tobacco Use   Smoking status: Never   Smokeless tobacco: Never  Vaping Use   Vaping Use: Never used  Substance Use Topics   Alcohol use: Never   Drug use: Not Currently    Home Medications Prior to Admission medications   Medication Sig Start Date End Date Taking? Authorizing Provider  atorvastatin (LIPITOR) 40 MG tablet TAKE 1 TABLET(40 MG) BY MOUTH DAILY Patient taking differently: Take 40 mg by mouth daily as needed (high cholesterol). 08/21/20  Yes [name], MD  cetirizine (ZYRTEC) 10 MG tablet Take 1 tablet (10 mg total) by mouth daily. 01/08/21  Yes [name], MD  hydrOXYzine (ATARAX/VISTARIL) 50 MG tablet Take 1 tablet (50 mg total) by mouth at bedtime as needed (sleep). 01/08/21  Yes  [name], MD  tadalafil (CIALIS) 10 MG tablet Take 1 tablet (10 mg total) by mouth daily as needed for erectile dysfunction. 05/25/20   [name], MD    Allergies    Patient has no known allergies.  Review of Systems   Review of Systems  Constitutional:  Positive for chills and diaphoresis. Negative for activity change, appetite change, fatigue and fever.  HENT:  Negative for congestion, postnasal drip, sore throat and trouble swallowing.   Eyes:  Negative for pain and visual disturbance.  Respiratory:  Negative for cough, choking, chest tightness and shortness of breath.   Cardiovascular:  Negative for chest pain, palpitations and leg swelling.  Gastrointestinal:  Positive for abdominal pain and nausea. Negative for abdominal distention, blood in stool, constipation, diarrhea and vomiting.  Endocrine: Negative for polyuria.  Genitourinary:  Negative for decreased urine volume, difficulty urinating, dysuria and hematuria.  Musculoskeletal:  Negative for back pain.       Right shoulder pain (radiating from the abdomen)  Skin:  Negative for color change and pallor.  Neurological:  Negative for dizziness, weakness and light-headedness.   Physical Exam Updated Vital Signs BP (!) 161/90   Pulse 66   Temp 97.9 F (36.6 C) (Oral)   Resp 19   Ht 6' (1.829 m)   Wt 88.2 kg  SpO2 96%   BMI 26.37 kg/m   Physical Exam Constitutional:      Appearance: He is well-developed and normal weight.  HENT:     Head: Normocephalic and atraumatic.  Eyes:     Extraocular Movements: Extraocular movements intact.     Pupils: Pupils are equal, round, and reactive to light.  Cardiovascular:     Rate and Rhythm: Normal rate and regular rhythm.     Heart sounds: Normal heart sounds.  Pulmonary:     Effort: Pulmonary effort is normal.     Breath sounds: Normal breath sounds.  Abdominal:     General: Abdomen is flat. Bowel sounds are normal.     Palpations: Abdomen is soft. There is no  fluid wave.     Tenderness: There is abdominal tenderness (to right lateral abdomen at the edge of the lower ribs). There is no right CVA tenderness, left CVA tenderness, guarding or rebound.     Hernia: No hernia is present.  Skin:    General: Skin is warm and dry.     Capillary Refill: Capillary refill takes less than 2 seconds.  Neurological:     General: No focal deficit present.     Mental Status: He is alert.  Psychiatric:        Mood and Affect: Mood normal.        Behavior: Behavior normal.    ED Results / Procedures / Treatments   Labs (all labs ordered are listed, but only abnormal results are displayed) Labs Reviewed  COMPREHENSIVE METABOLIC PANEL - Abnormal; Notable for the following components:      Result Value   Sodium 133 (*)    Glucose, Bld 144 (*)    Calcium 8.7 (*)    All other components within normal limits  LACTIC ACID, PLASMA - Abnormal; Notable for the following components:   Lactic Acid, Venous 2.3 (*)    All other components within normal limits  CBG MONITORING, ED - Abnormal; Notable for the following components:   Glucose-Capillary 144 (*)    All other components within normal limits  CBC WITH DIFFERENTIAL/PLATELET  LIPASE, BLOOD  URINALYSIS, ROUTINE W REFLEX MICROSCOPIC  LACTIC ACID, PLASMA  TROPONIN I (HIGH SENSITIVITY)  TROPONIN I (HIGH SENSITIVITY)    EKG None  Radiology US Abdomen Limited  Result Date: 01/11/2021 CLINICAL DATA:  Right upper quadrant pain EXAM: ULTRASOUND ABDOMEN LIMITED RIGHT UPPER QUADRANT COMPARISON:  None. FINDINGS: Gallbladder: No gallstones or wall thickening visualized. No sonographic Murphy sign noted by sonographer. Common bile duct: Diameter: 4 mm Liver: There is a heterogeneously echoic mass in the right hepatic lobe measuring 7.9 cm x 4.9 cm x 6.7 cm with internal vascularity. The liver parenchyma is otherwise diffusely heterogeneous. Portal vein is patent on color Doppler imaging with normal direction of blood  flow towards the liver. Other: None. IMPRESSION: Solid mass in the right hepatic lobe measuring up to 7.9 cm. MRI of the abdomen with and without contrast is recommended for characterization. Electronically Signed   By: Valetta Mole M.D.   On: 01/11/2021 15:32    Procedures Procedures   Medications Ordered in ED Medications  sodium chloride 0.9 % bolus 1,000 mL (1,000 mLs Intravenous New Bag/Given 01/11/21 1607)    ED Course  I have reviewed the triage vital signs and the nursing notes.  Pertinent labs & imaging results that were available during my care of the patient were reviewed by me and considered in my medical decision making (see  chart for details).    MDM Rules/Calculators/A&P                         Franklin Rivera is a 70 y.o. presenting with abdominal pain since yesterday. PMH significant for Hepatitis C (treated with Epclusa) and kidney stones.   Patient's abdominal pain started this morning, it is sharp and intermittent, worsened with certain movements such as sitting up. When patient was in the waiting room he had an episode of diaphoresis, lightheadedness, and hypotension and was brought to a room for closer management. Patient continued to have another episode of hypotension documented, though without symptoms. Patient's lactic acid is also elevated at 2.3, unsure if this is a true elevation or not but we will go ahead and give a 1L bolus of NS.  RUQ [place] was completed to evaluate for gallbladder etiology, scan showed a 7.9cm solid mass in the right hepatic lobe with recommendations for MRI.  Patient care was discussed with Dr. Melina Copa to follow-up MRI results for further evaluation of the hepatic mass. Patient may be a candidate for outpatient follow-up for biopsy unless vitals continue to oscillate.  Final Clinical Impression(s) / ED Diagnoses Final diagnoses:  Liver mass, right lobe  Right upper quadrant abdominal pain    Rx / DC Orders ED Discharge Orders     None         Rise Patience, DO 01/11/21 1614    Elnora Geffre, MD 01/16/21 1531

## 2021-01-11 NOTE — ED Notes (Signed)
Patient transported to MRI 

## 2021-01-11 NOTE — ED Provider Notes (Signed)
Signout from Dr. Reather Converse.  70 year old male remote history of hep C here with right upper quadrant abdominal pain.  Ultrasound shows hepatic mass.  He is awaiting MRI for further evaluation.  Disposition per results of MRI. Physical Exam  BP (!) 158/89   Pulse 61   Temp 97.9 F (36.6 C) (Oral)   Resp 16   Ht 6' (1.829 m)   Wt 88.2 kg   SpO2 97%   BMI 26.37 kg/m   Physical Exam  ED Course/Procedures     Procedures  MDM  Patient's liver mass on MRI concerning for hepatocellular carcinoma.  Patient states he is in no pain currently has been afebrile not tachycardic.  Denies any recent illness.  He said he had treatment for hep C about a year and a half ago with the infectious disease clinic.  He does not know why he had hep C.  He denies any street drugs.  Works as a [employer].  Repeat lactate is rising.  Patient currently asymptomatic.  Will give more fluids and reassess.  There was a report of some transient low blood pressures although I do not see any recent documentation of such.  Discussed with oncology Dr. Irene Limbo who said they can get him set up in the oncology clinic early next week.  Patient is chest x-ray showing probable left lower lobe infiltrate.  Although he is asymptomatic I do not have another source for his rising lactate.  Will cover with antibiotics and get more fluids.  He is pending a repeat lactate.  He is comfortable plan for discharge as long as labs are improving.       Hayden Rasmussen, MD 01/12/21 1141

## 2021-01-11 NOTE — Telephone Encounter (Signed)
Please schedule f/u for pt.  

## 2021-01-11 NOTE — Telephone Encounter (Signed)
Those are not typical side effects. Needs appointment if symptoms are not improving.  Algis Greenhouse. Jerline Pain, MD 01/11/2021 12:59 PM

## 2021-01-11 NOTE — ED Triage Notes (Signed)
Pt endorses right sided abd pain and right shoulder pain that started last night.

## 2021-01-11 NOTE — Telephone Encounter (Signed)
FYI, will pt need a f/u OV regarding this?

## 2021-01-11 NOTE — Discharge Instructions (Addendum)
You were seen in the emergency department for some left-sided abdominal pain nausea and vomiting.  You had an ultrasound and an MRI of your abdomen that showed a mass in your liver.  This will need close follow-up.  The oncology clinic should call you on Monday for an appointment.  Your chest x-ray also showed possible signs of pneumonia.  We are putting you on an antibiotic.  We also calling in a prescription for some pain medicine.  Please return to the emergency department if any high fevers worsening pain or other concerns.

## 2021-01-11 NOTE — Telephone Encounter (Signed)
Patient called in stating he took hydroxyzine last night.    Believes he is having a reaction to the medication.  States he is having right side cramps and shoulder pain.  I have sent patient to [facility] for triage.   Please follow up with patient in regard.

## 2021-01-14 ENCOUNTER — Telehealth: Payer: Self-pay | Admitting: Hematology

## 2021-01-14 ENCOUNTER — Encounter: Payer: Self-pay | Admitting: Family Medicine

## 2021-01-14 NOTE — Telephone Encounter (Signed)
Patient went to the ED

## 2021-01-14 NOTE — Telephone Encounter (Signed)
Scheduled appt per 10/15 staff msg from Dr. Irene Limbo. Spoke to pt, he is aware of appt date and time as well as location.

## 2021-01-16 LAB — CULTURE, BLOOD (ROUTINE X 2)
Culture: NO GROWTH
Special Requests: ADEQUATE

## 2021-01-21 ENCOUNTER — Inpatient Hospital Stay: Payer: Medicare HMO

## 2021-01-21 ENCOUNTER — Other Ambulatory Visit: Payer: Self-pay

## 2021-01-21 ENCOUNTER — Inpatient Hospital Stay: Payer: Medicare HMO | Attending: Hematology | Admitting: Hematology

## 2021-01-21 VITALS — BP 112/69 | HR 84 | Temp 98.1°F | Resp 17 | Ht 72.0 in | Wt 196.0 lb

## 2021-01-21 DIAGNOSIS — C220 Liver cell carcinoma: Secondary | ICD-10-CM

## 2021-01-21 DIAGNOSIS — Z125 Encounter for screening for malignant neoplasm of prostate: Secondary | ICD-10-CM | POA: Insufficient documentation

## 2021-01-21 DIAGNOSIS — E785 Hyperlipidemia, unspecified: Secondary | ICD-10-CM | POA: Diagnosis not present

## 2021-01-21 DIAGNOSIS — K7689 Other specified diseases of liver: Secondary | ICD-10-CM | POA: Diagnosis not present

## 2021-01-21 DIAGNOSIS — R160 Hepatomegaly, not elsewhere classified: Secondary | ICD-10-CM

## 2021-01-21 DIAGNOSIS — B182 Chronic viral hepatitis C: Secondary | ICD-10-CM | POA: Diagnosis not present

## 2021-01-21 DIAGNOSIS — R978 Other abnormal tumor markers: Secondary | ICD-10-CM | POA: Insufficient documentation

## 2021-01-21 DIAGNOSIS — Z79899 Other long term (current) drug therapy: Secondary | ICD-10-CM

## 2021-01-21 LAB — CBC WITH DIFFERENTIAL/PLATELET
Abs Immature Granulocytes: 0.02 10*3/uL (ref 0.00–0.07)
Basophils Absolute: 0 10*3/uL (ref 0.0–0.1)
Basophils Relative: 0 %
Eosinophils Absolute: 0.1 10*3/uL (ref 0.0–0.5)
Eosinophils Relative: 2 %
HCT: 37.6 % — ABNORMAL LOW (ref 39.0–52.0)
Hemoglobin: 12.1 g/dL — ABNORMAL LOW (ref 13.0–17.0)
Immature Granulocytes: 0 %
Lymphocytes Relative: 23 %
Lymphs Abs: 1.4 10*3/uL (ref 0.7–4.0)
MCH: 28.7 pg (ref 26.0–34.0)
MCHC: 32.2 g/dL (ref 30.0–36.0)
MCV: 89.3 fL (ref 80.0–100.0)
Monocytes Absolute: 0.4 10*3/uL (ref 0.1–1.0)
Monocytes Relative: 7 %
Neutro Abs: 4.1 10*3/uL (ref 1.7–7.7)
Neutrophils Relative %: 68 %
Platelets: 274 10*3/uL (ref 150–400)
RBC: 4.21 MIL/uL — ABNORMAL LOW (ref 4.22–5.81)
RDW: 13.7 % (ref 11.5–15.5)
WBC: 6.1 10*3/uL (ref 4.0–10.5)
nRBC: 0 % (ref 0.0–0.2)

## 2021-01-21 LAB — CMP (CANCER CENTER ONLY)
ALT: 36 U/L (ref 0–44)
AST: 32 U/L (ref 15–41)
Albumin: 4.1 g/dL (ref 3.5–5.0)
Alkaline Phosphatase: 68 U/L (ref 38–126)
Anion gap: 3 — ABNORMAL LOW (ref 5–15)
BUN: 19 mg/dL (ref 8–23)
CO2: 32 mmol/L (ref 22–32)
Calcium: 9.2 mg/dL (ref 8.9–10.3)
Chloride: 102 mmol/L (ref 98–111)
Creatinine: 0.9 mg/dL (ref 0.61–1.24)
GFR, Estimated: >60 mL/min (ref >60)
Glucose, Bld: 98 mg/dL (ref 70–99)
Potassium: 4.5 mmol/L (ref 3.5–5.1)
Sodium: 137 mmol/L (ref 135–145)
Total Bilirubin: 0.3 mg/dL (ref 0.3–1.2)
Total Protein: 8.1 g/dL (ref 6.5–8.1)

## 2021-01-21 LAB — PROTIME-INR
INR: 1 (ref 0.8–1.2)
Prothrombin Time: 13.2 seconds (ref 11.4–15.2)

## 2021-01-21 NOTE — Progress Notes (Addendum)
[name]   HEMATOLOGY/ONCOLOGY CONSULTATION NOTE  Date of Service: 01/21/2021  Patient Care Team: [name], MD as PCP - General (Family Medicine) Golden Circle, FNP as Nurse Practitioner (Family Medicine) [facility] as Consulting Physician Rozetta Nunnery, MD as Consulting Physician (Otolaryngology)  CHIEF COMPLAINTS/PURPOSE OF CONSULTATION:  Newly noted liver mass concerning for hepatocellular carcinoma  HISTORY OF PRESENTING ILLNESS:   Franklin Rivera is a wonderful 70 y.o. male who has been referred to [place] by Dr Aletta Edouard from the [facility] emergency room for evaluation and management of newly noted liver mass.  Patient has a history of hepatitis C and presented to the emergency room in mid October 2022 with right upper quadrant abdominal pain.  He had an ultrasound of the abdomen which showed Solid mass in the right hepatic lobe measuring up to 7.9 cm. MRI of the abdomen with and without contrast is recommended for characterization.  Patient subsequently had an MRI of the abdomen with and without contrast on 01/11/2021 which showed capsular based 5.8 x 4.8 by 5.2 cm lesion which demonstrates heterogeneous T2 hyperintensity. Arterial phase heterogeneous non rim enhancement. Delayed washout with capsular enhancement. highly suspicious for hepatocellular carcinoma. In this patient with chronic hepatitis C but no known cirrhosis, LI-RADS criteria do not strictly apply. If the patient does have known cirrhosis, this would be considered LR 5. No findings of abdominal metastatic disease. Small volume abdominal ascites.  Patient notes he was diagnosed with hepatitis C genotype Ia with a fibrosis score of F3 and was treated with Epclusa for 12 weeks starting on 07/13/2019 with Mauricio Po FNP.  He is not aware of any plan that he had with infectious disease or primary care for [facility] screening with repeat ultrasounds prior to current presentation.  Patient  notes that his abdominal pain is somewhat better currently.  He was given as needed Percocet on discharge from the emergency room but has not needed this much. Denies any significant weight loss in the last 3 to 6 months. Notes no other acute new focal symptoms. No jaundice. No change in the color of his urine. No change in bowel habits or color of his stools. Patient notes only social alcohol use and denies heavy alcohol abuse.  MEDICAL HISTORY:  Past Medical History:  Diagnosis Date   Allergy    Headache    Hyperlipidemia     SURGICAL HISTORY: Past Surgical History:  Procedure Laterality Date   BLADDER SURGERY     NASAL FRACTURE SURGERY     Skull Surgery      SOCIAL HISTORY: Social History   Socioeconomic History   Marital status: Married    Spouse name: Not on file   Number of children: Not on file   Years of education: Not on file   Highest education level: Not on file  Occupational History   Not on file  Tobacco Use   Smoking status: Never   Smokeless tobacco: Never  Vaping Use   Vaping Use: Never used  Substance and Sexual Activity   Alcohol use: Never   Drug use: Not Currently   Sexual activity: Not on file  Other Topics Concern   Not on file  Social History Narrative   Not on file   Social Determinants of Health   Financial Resource Strain: Low Risk    Difficulty of Paying Living Expenses: Not hard at all  Food Insecurity: No Food Insecurity   Worried About Estate manager/land agent of Food  in the Last Year: Never true   [place] in the Last Year: Never true  Transportation Needs: No Transportation Needs   Lack of Transportation (Medical): No   Lack of Transportation (Non-Medical): No  Physical Activity: Inactive   Days of Exercise per Week: 0 days   Minutes of Exercise per Session: 0 min  Stress: No Stress Concern Present   Feeling of Stress : Not at all  Social Connections: Moderately Isolated   Frequency of Communication with Friends and Family:  More than three times a week   Frequency of Social Gatherings with Friends and Family: More than three times a week   Attends Religious Services: Never   Marine scientist or Organizations: No   Attends Music therapist: Never   Marital Status: Married  Human resources officer Violence: Not At Risk   Fear of Current or Ex-Partner: No   Emotionally Abused: No   Physically Abused: No   Sexually Abused: No    FAMILY HISTORY: Family History  Problem Relation Age of Onset   Cancer Neg Hx     ALLERGIES:  has No Known Allergies.  MEDICATIONS:  Current Outpatient Medications  Medication Sig Dispense Refill   atorvastatin (LIPITOR) 40 MG tablet TAKE 1 TABLET(40 MG) BY MOUTH DAILY (Patient taking differently: Take 40 mg by mouth daily as needed (high cholesterol).) 90 tablet 3   azithromycin (ZITHROMAX) 250 MG tablet Take 1 tablet (250 mg total) by mouth daily. 4 tablet 0   cetirizine (ZYRTEC) 10 MG tablet Take 1 tablet (10 mg total) by mouth daily. 90 tablet 0   hydrOXYzine (ATARAX/VISTARIL) 50 MG tablet Take 1 tablet (50 mg total) by mouth at bedtime as needed (sleep). 90 tablet 0   oxyCODONE-acetaminophen (PERCOCET/ROXICET) 5-325 MG tablet Take 1 tablet by mouth every 6 (six) hours as needed for severe pain. 15 tablet 0   tadalafil (CIALIS) 10 MG tablet Take 1 tablet (10 mg total) by mouth daily as needed for erectile dysfunction. 30 tablet 0   No current facility-administered medications for this visit.    REVIEW OF SYSTEMS:    10 Point review of Systems was done is negative except as noted above.  PHYSICAL EXAMINATION: ECOG PERFORMANCE STATUS: 1  . Vitals:   01/21/21 1121  BP: 112/69  Pulse: 84  Resp: 17  Temp: 98.1 F (36.7 C)  SpO2: 100%   Filed Weights   01/21/21 1121  Weight: 196 lb (88.9 kg)   .Body mass index is 26.58 kg/m.  GENERAL:alert, in no acute distress and comfortable SKIN: no acute rashes, no significant lesions EYES: conjunctiva are  pink and non-injected, sclera anicteric OROPHARYNX: MMM, no exudates, no oropharyngeal erythema or ulceration NECK: supple, no JVD LYMPH:  no palpable lymphadenopathy in the cervical, axillary or inguinal regions LUNGS: clear to auscultation b/l with normal respiratory effort HEART: regular rate & rhythm ABDOMEN:  normoactive bowel sounds , mild tenderness to deep palpation in right upper quadrant, just palpable hepatomegaly Extremity: no pedal edema PSYCH: alert & oriented x 3 with fluent speech NEURO: no focal motor/sensory deficits  LABORATORY DATA:  I have reviewed the data as listed  . CBC Latest Ref Rng & Units 01/21/2021 01/11/2021 05/25/2020  WBC 4.0 - 10.5 K/uL 6.1 7.2 2.4(L)  Hemoglobin 13.0 - 17.0 g/dL 12.1(L) 13.2 15.4  Hematocrit 39.0 - 52.0 % 37.6(L) 40.6 47.3  Platelets 150 - 400 K/uL 274 208 157.0    . CMP Latest Ref Rng & Units  01/21/2021 01/11/2021 05/25/2020  Glucose 70 - 99 mg/dL 98 144(H) 92  BUN 8 - 23 mg/dL 19 13 8   Creatinine 0.61 - 1.24 mg/dL 0.90 0.81 0.83  Sodium 135 - 145 mmol/L 137 133(L) 138  Potassium 3.5 - 5.1 mmol/L 4.5 4.0 4.9  Chloride 98 - 111 mmol/L 102 102 100  CO2 22 - 32 mmol/L 32 22 33(H)  Calcium 8.9 - 10.3 mg/dL 9.2 8.7(L) 9.2  Total Protein 6.5 - 8.1 g/dL 8.1 6.9 7.9  Total Bilirubin 0.3 - 1.2 mg/dL 0.3 0.6 0.3  Alkaline Phos 38 - 126 U/L 68 57 71  AST 15 - 41 U/L 32 26 16  ALT 0 - 44 U/L 36 32 12   HCV FibroSURE Order: [id number] Status: Final result   Visible to patient: Yes (not seen)   Next appt: 02/05/2021 at 08:30 AM in Radiology ([facility])   Dx: Liver mass   0 Result Notes Component Ref Range & Units 10 d ago 1 yr ago  Fibrosis Score 0.00 - 0.21 0.80 High   0.72 R   Fibrosis Stage   Cirrhosis  F3      Component     Latest Ref Rng & Units 01/21/2021  Prothrombin Time     11.4 - 15.2 seconds 13.2  INR     0.8 - 1.2 1.0  AFP, Serum, Tumor Marker     0.0 - 8.4 ng/mL 28.9 (H)  CEA ([facility]-In House)     0.00 - 5.00  ng/mL 1.49  Prostate Specific Ag, Serum     0.0 - 4.0 ng/mL 1.0  CA 19-9     0 - 35 U/mL 21   RADIOGRAPHIC STUDIES: I have personally reviewed the radiological images as listed and agreed with the findings in the report. MR Abdomen W or Wo Contrast  Result Date: 01/11/2021 CLINICAL DATA:  Right hepatic lobe mass on ultrasound. Right upper quadrant pain. History of hepatitis C. EXAM: MRI ABDOMEN WITHOUT AND WITH CONTRAST TECHNIQUE: Multiplanar multisequence MR imaging of the abdomen was performed both before and after the administration of intravenous contrast. CONTRAST:  64mL GADAVIST GADOBUTROL 1 MMOL/ML IV SOLN COMPARISON:  Ultrasound of earlier today. FINDINGS: Lower chest: Mild cardiomegaly, without pericardial or pleural effusion. Hepatobiliary: No specific evidence of cirrhosis. Corresponding to the ultrasound abnormality, within the right hepatic lobe (centered in segment 6) is a capsular based 5.8 x 4.8 by 5.2 cm lesion which demonstrates heterogeneous T2 hyperintensity. Arterial phase heterogeneous non rim enhancement. Example 31/14. Delayed washout with capsular enhancement including on 31/21. Normal gallbladder, without biliary ductal dilatation. Pancreas:  Normal, without mass or ductal dilatation. Spleen:  Normal in size, without focal abnormality. Adrenals/Urinary Tract: Normal adrenal glands. Too small to characterize upper pole left renal lesion. Normal right kidney. No hydronephrosis. Stomach/Bowel: Small hiatal hernia.  Normal abdominal bowel loops. Vascular/Lymphatic: Aortic atherosclerosis. Patent portal and hepatic veins, without evidence of portal venous hypertension. Mildly prominent porta hepatis nodes, none pathologic by size criteria. Other:  Small volume abdominal ascites. Musculoskeletal: No acute osseous abnormality. IMPRESSION: 1. Right hepatic lobe mass is highly suspicious for hepatocellular carcinoma. In this patient with chronic hepatitis C but no known cirrhosis, LI-RADS  criteria do not strictly apply. If the patient does have known cirrhosis, this would be considered LR 5. 2. No findings of abdominal metastatic disease. 3. Small volume abdominal ascites. 4.  Aortic Atherosclerosis (ICD10-I70.0). 5. Small hiatal hernia. Electronically Signed   By: Abigail Miyamoto M.D.   On: 01/11/2021  17:42   US Abdomen Limited  Result Date: 01/11/2021 CLINICAL DATA:  Right upper quadrant pain EXAM: ULTRASOUND ABDOMEN LIMITED RIGHT UPPER QUADRANT COMPARISON:  None. FINDINGS: Gallbladder: No gallstones or wall thickening visualized. No sonographic Murphy sign noted by sonographer. Common bile duct: Diameter: 4 mm Liver: There is a heterogeneously echoic mass in the right hepatic lobe measuring 7.9 cm x 4.9 cm x 6.7 cm with internal vascularity. The liver parenchyma is otherwise diffusely heterogeneous. Portal vein is patent on color Doppler imaging with normal direction of blood flow towards the liver. Other: None. IMPRESSION: Solid mass in the right hepatic lobe measuring up to 7.9 cm. MRI of the abdomen with and without contrast is recommended for characterization. Electronically Signed   By: Valetta Mole M.D.   On: 01/11/2021 15:32   DG Chest Port 1 View  Result Date: 01/11/2021 CLINICAL DATA:  Weakness and shoulder pain, initial encounter EXAM: PORTABLE CHEST 1 VIEW COMPARISON:  None. FINDINGS: Cardiac shadow is at the upper limits of normal in size. Aortic calcifications are noted. Lungs are well aerated bilaterally. Patchy airspace opacities are noted in the left base consistent with acute infiltrate. No bony abnormality is noted. IMPRESSION: Left basilar infiltrate. Electronically Signed   By: Inez Catalina M.D.   On: 01/11/2021 19:58    ASSESSMENT & PLAN:   70 year old male with history of hepatitis C with   1) new right hepatic lobe 7.9 cm mass concerning for hepatocellular carcinoma. Told he does not have overt cirrhotic morphology on MRI he previously has had a fibrosis score  of F3. MRI is LIRADS 5 concern for hepatocellular carcinoma. Component     Latest Ref Rng & Units 01/21/2021  Prothrombin Time     11.4 - 15.2 seconds 13.2  INR     0.8 - 1.2 1.0  AFP, Serum, Tumor Marker     0.0 - 8.4 ng/mL 28.9 (H)  CEA ([facility]-In House)     0.00 - 5.00 ng/mL 1.49  Prostate Specific Ag, Serum     0.0 - 4.0 ng/mL 1.0  CA 19-9     0 - 35 U/mL 21   2) liver cirrhosis likely related to hepatitis C -HCV FibroSure testing was done and shows F4 status consistent with liver cirrhosis Has been treated with Epclusa for 12 weeks starting 07/12/2020 with Terri Piedra FNP. PLAN -Available imaging studies were discussed with the patient in details. -In the context of liver cirrhosis because of hepatitis C and MRI showing Li-RADs 5 imaging characteristics and with an elevated AFP tumor marker with other tumor markers being unrevealing this is consistent with hepatocellular carcinoma. -Do not anticipate need for biopsy unless the CT chest abdomen pelvis shows unusual pattern of spread. -We will get a CT chest abdomen pelvis to complete staging. -We will refer patient to interventional radiology will likely need consideration of transarterial therapies for definitive management versus bridge to possible surgery. -We will also refer the patient to surgery for their input however given the size of the tumor this might not be the preferred approach. -We shall see him back after his  chest abdomen pelvis to review all his lab and CT information.  Follow-up Labs today CT chest abdomen pelvis IR consultation for possible Trans-arterial therapies.   Surgery referral to Dr Barry Dienes for consideration of surgical treatment options  Phone visit with Dr. Irene Limbo in 2 weeks   Orders Placed This Encounter  Procedures   [place] ELASTOGRAPHY LIVER    Standing Status:  Future    Standing Expiration Date:   01/21/2022    Order Specific Question:   Reason for Exam (SYMPTOM  OR DIAGNOSIS REQUIRED)     Answer:   Patient with hepatitis C with new liver mass no clear history of liver cirrhosis imaging please evaluate for fibrosis risk    Order Specific Question:   Preferred imaging location?    Answer:   [facility]   CT CHEST ABDOMEN PELVIS W CONTRAST    Standing Status:   Future    Standing Expiration Date:   01/21/2022    Order Specific Question:   Preferred imaging location?    Answer:   [facility]    Order Specific Question:   Is Oral Contrast requested for this exam?    Answer:   Yes, Per Radiology protocol    Order Specific Question:   Reason for Exam (SYMPTOM  OR DIAGNOSIS REQUIRED)    Answer:   Liver mass possible hepatocellular carcinoma versus intrahepatic cholangiocarcinoma for initial staging   CBC with Differential/Platelet    Standing Status:   Future    Number of Occurrences:   1    Standing Expiration Date:   01/21/2022   CMP ([place] only)    Standing Status:   Future    Number of Occurrences:   1    Standing Expiration Date:   01/21/2022   AFP tumor marker    Standing Status:   Future    Number of Occurrences:   1    Standing Expiration Date:   01/21/2022   CEA (IN HOUSE-[facility])FOR [facility] ONLY    Standing Status:   Future    Number of Occurrences:   1    Standing Expiration Date:   01/21/2022   Prostate-Specific AG, Serum    Standing Status:   Future    Number of Occurrences:   1    Standing Expiration Date:   01/21/2022   CA 19.9    Standing Status:   Future    Number of Occurrences:   1    Standing Expiration Date:   01/21/2022   Protime-INR    Standing Status:   Future    Number of Occurrences:   1    Standing Expiration Date:   01/21/2022   HCV FibroSURE    Standing Status:   Future    Number of Occurrences:   1    Standing Expiration Date:   01/21/2022   Ambulatory referral to Interventional Radiology    Referral Priority:   Urgent    Referral Type:   Consultation    Referral Reason:   Specialty Services Required     Referred to Provider:   Aletta Edouard, MD    Requested Specialty:   Interventional Radiology    Number of Visits Requested:   1   Ambulatory referral to General Surgery    Referral Priority:   Routine    Referral Type:   Surgical    Referral Reason:   Specialty Services Required    Referred to Provider:   Stark Klein, MD    Requested Specialty:   General Surgery    Number of Visits Requested:   1    All of the patients questions were answered with apparent satisfaction. The patient knows to call the clinic with any problems, questions or concerns.  I spent  55 mins counseling the patient face to face. The total time spent in the appointment was 80 mins and  more than 50% was on counseling and direct patient cares, coordination of care with IR.    Sullivan Lone MD [place] AAHIVMS [facility] Hematology/Oncology Physician [place]  01/21/2021 11:24 AM

## 2021-01-21 NOTE — Patient Instructions (Signed)
Thank you for choosing [facility] to provide your care.   Should you have questions after your visit to the [facility] ([facility]), please contact this office at [phone] between 8:30 AM and 4:30 PM.  Voice mails left after 4:00 PM may not be returned until the following business day.  Calls received after 4:30 PM will be answered by an off-site Nurse Triage Line.    Prescription Refills:  Please have your pharmacy contact us directly for most prescription requests.  Contact the office directly for refills of narcotics (pain medications). Allow 48-72 hours for refills.  Appointments: Please contact the [facility] scheduling department [phone] for questions regarding [facility] appointment scheduling.  Contact the schedulers with any scheduling changes so that your appointment can be rescheduled in a timely manner.   Central Scheduling for [place] [phone] - Call to schedule procedures such as PET scans, CT scans, MRI, Ultrasound, etc.  To afford each patient quality time with our providers, please arrive 30 minutes before your scheduled appointment time.  If you arrive late for your appointment, you may be asked to reschedule.  We strive to give you quality time with our providers, and arriving late affects you and other patients whose appointments are after yours. If you are a no show for multiple scheduled visits, you may be dismissed from the clinic at the providers discretion.     Resources: [facility] Social Workers [phone] for additional information on assistance programs or assistance connecting with community support programs   [place] DSS  336-641-3447: Information regarding food stamps, Medicaid, and utility assistance [place] [employer] [place] 336-333-6589   [place] [employer]'s shared-ride transportation service for eligible riders who have a disability that prevents them from riding the fixed route bus.   Medicare Rights Center 800-333-4114  Helps people with Medicare understand their rights and benefits, navigate the Medicare system, and secure the quality healthcare they deserve [employer] 800-227-2345 Assists patients locate various types of support and financial assistance Cancer Care: 1-800-813-HOPE (4673) Provides financial assistance, online support groups, medication/co-pay assistance.   Transportation Assistance for appointments at [facility]: Transportation Coordinator [phone]  Again, thank you for choosing [facility] for your care.       

## 2021-01-22 LAB — CANCER ANTIGEN 19-9: CA 19-9: 21 U/mL (ref 0–35)

## 2021-01-22 LAB — PROSTATE-SPECIFIC AG, SERUM (LABCORP): Prostate Specific Ag, Serum: 1 ng/mL (ref 0.0–4.0)

## 2021-01-22 LAB — AFP TUMOR MARKER: AFP, Serum, Tumor Marker: 28.9 ng/mL — ABNORMAL HIGH (ref 0.0–8.4)

## 2021-01-22 LAB — CEA (IN HOUSE-CHCC): CEA (CHCC-In House): 1.49 ng/mL (ref 0.00–5.00)

## 2021-01-24 LAB — HCV FIBROSURE
ALPHA 2-MACROGLOBULINS, QN: 305 mg/dL — ABNORMAL HIGH (ref 110–276)
ALT (SGPT) P5P: 52 IU/L (ref 0–55)
Apolipoprotein A-1: 103 mg/dL (ref 101–178)
Bilirubin, Total: 0.2 mg/dL (ref 0.0–1.2)
Fibrosis Score: 0.8 — ABNORMAL HIGH (ref 0.00–0.21)
GGT: 26 IU/L (ref 0–65)
Haptoglobin: <10 mg/dL — ABNORMAL LOW (ref 32–363)
Necroinflammat Activity Score: 0.48 — ABNORMAL HIGH (ref 0.00–0.17)

## 2021-01-31 NOTE — Addendum Note (Signed)
Addended by: Sullivan Lone on: 01/31/2021 11:10 AM   Modules accepted: Orders, Level of Service

## 2021-02-05 ENCOUNTER — Other Ambulatory Visit: Payer: Self-pay

## 2021-02-05 ENCOUNTER — Ambulatory Visit (HOSPITAL_COMMUNITY)
Admission: RE | Admit: 2021-02-05 | Discharge: 2021-02-05 | Disposition: A | Discharge status: Home / Self Care | Payer: Medicare HMO | Source: Ambulatory Visit | Attending: Hematology | Admitting: Hematology

## 2021-02-05 DIAGNOSIS — B1920 Unspecified viral hepatitis C without hepatic coma: Secondary | ICD-10-CM | POA: Diagnosis not present

## 2021-02-05 DIAGNOSIS — N400 Enlarged prostate without lower urinary tract symptoms: Secondary | ICD-10-CM | POA: Diagnosis not present

## 2021-02-05 DIAGNOSIS — R160 Hepatomegaly, not elsewhere classified: Secondary | ICD-10-CM | POA: Insufficient documentation

## 2021-02-05 DIAGNOSIS — K7460 Unspecified cirrhosis of liver: Secondary | ICD-10-CM | POA: Diagnosis not present

## 2021-02-05 DIAGNOSIS — I2510 Atherosclerotic heart disease of native coronary artery without angina pectoris: Secondary | ICD-10-CM | POA: Diagnosis not present

## 2021-02-05 DIAGNOSIS — K7689 Other specified diseases of liver: Secondary | ICD-10-CM | POA: Diagnosis not present

## 2021-02-05 DIAGNOSIS — I700 Atherosclerosis of aorta: Secondary | ICD-10-CM | POA: Diagnosis not present

## 2021-02-05 MED ORDER — IOHEXOL 350 MG/ML SOLN
80.0000 mL | Freq: Once | INTRAVENOUS | Status: AC | PRN
  Administered 2021-02-05: 80 mL via INTRAVENOUS

## 2021-02-07 ENCOUNTER — Inpatient Hospital Stay: Payer: Medicare HMO | Attending: Hematology | Admitting: Hematology

## 2021-02-07 DIAGNOSIS — C220 Liver cell carcinoma: Secondary | ICD-10-CM

## 2021-02-07 DIAGNOSIS — B182 Chronic viral hepatitis C: Secondary | ICD-10-CM | POA: Insufficient documentation

## 2021-02-07 DIAGNOSIS — Z79899 Other long term (current) drug therapy: Secondary | ICD-10-CM | POA: Insufficient documentation

## 2021-02-07 DIAGNOSIS — K7689 Other specified diseases of liver: Secondary | ICD-10-CM | POA: Diagnosis not present

## 2021-02-12 ENCOUNTER — Other Ambulatory Visit: Payer: Self-pay

## 2021-02-12 ENCOUNTER — Ambulatory Visit (INDEPENDENT_AMBULATORY_CARE_PROVIDER_SITE_OTHER): Payer: Medicare HMO | Admitting: Family Medicine

## 2021-02-12 ENCOUNTER — Encounter: Payer: Self-pay | Admitting: Family Medicine

## 2021-02-12 DIAGNOSIS — G8929 Other chronic pain: Secondary | ICD-10-CM

## 2021-02-12 DIAGNOSIS — C220 Liver cell carcinoma: Secondary | ICD-10-CM | POA: Diagnosis not present

## 2021-02-12 MED ORDER — HYDROCODONE-ACETAMINOPHEN 10-325 MG PO TABS: 1.0000 | ORAL_TABLET | Freq: Two times a day (BID) | ORAL | 0 refills | Status: AC

## 2021-02-12 NOTE — Patient Instructions (Signed)
It was very nice to see you today!  I will refer you to see a pain specialist.  I will refill your pain meds. We cannot do this long term.   Take care, Dr Jerline Pain  PLEASE NOTE:  If you had any lab tests please let us know if you have not heard back within a few days. You may see your results on mychart before we have a chance to review them but we will give you a call once they are reviewed by [place]. If we ordered any referrals today, please let us know if you have not heard from their office within the next week.   Please try these tips to maintain a healthy lifestyle:  Eat at least 3 REAL meals and 1-2 snacks per day.  Aim for no more than 5 hours between eating.  If you eat breakfast, please do so within one hour of getting up.   Each meal should contain half fruits/vegetables, one quarter protein, and one quarter carbs (no bigger than a computer mouse)  Cut down on sweet beverages. This includes juice, soda, and sweet tea.   Drink at least 1 glass of water with each meal and aim for at least 8 glasses per day  Exercise at least 150 minutes every week.

## 2021-02-12 NOTE — Assessment & Plan Note (Signed)
Patient with chronic pain in bilateral feet secondary to arthritic changes in remote history of fracture.  He has tried conservative management has seen sports medicine in the past.  Does not wish to pursue surgery at this point.  We will refill a short supply of hydrocodone.  He has done well with this in the past.  Discussed with patient this would not be a long-term prescription.  We will place referral to pain management clinic to discuss long-term pain management options.

## 2021-02-12 NOTE — Progress Notes (Signed)
   Franklin Rivera is a 70 y.o. male who presents today for an office visit.  Assessment/Plan:  Chronic Problems Addressed Today: Chronic pain Patient with chronic pain in bilateral feet secondary to arthritic changes in remote history of fracture.  He has tried conservative management has seen sports medicine in the past.  Does not wish to pursue surgery at this point.  We will refill a short supply of hydrocodone.  He has done well with this in the past.  Discussed with patient this would not be a long-term prescription.  We will place referral to pain management clinic to discuss long-term pain management options.  Hepatocellular carcinoma [facility]) Following with oncology.  Will be following up with surgery in a couple of weeks.  Currently asymptomatic.     Subjective:  HPI:  He complain of bilateral foot pain. This is a chronic and ongoing issue.  Pain limits ability to stay active.  He is currently still working in Dispensing optician.  Pain is debilitating and limits his ability to work.  He was seen in the ED a month ago for severe abdominal pain.  Found to have liver mass and subsequent work-up showed likely hepatocellular carcinoma.  He was prescribed oxycodone for his abdominal pain.  This has significantly helped with his foot pain.  He has tried conservative management for foot pain in the past without significant improvement including seeing sports medicine.        Objective:  Physical Exam: BP 137/80   Pulse 80   Temp (!) 97.2 F (36.2 C) (Temporal)   Ht 6' (1.829 m)   Wt 192 lb 3.2 oz (87.2 kg)   SpO2 100%   BMI 26.07 kg/m   Gen: No acute distress, resting comfortably CV: Regular rate and rhythm with no murmurs appreciated Pulm: Normal work of breathing, clear to auscultation bilaterally with no crackles, wheezes, or rhonchi MSK: Bilateral feet without significant abnormality.  Tenderness palpation at the calcaneus right foot. Neuro: Grossly normal, moves all extremities Psych:  Normal affect and thought content       I,Savera Zaman,acting as a scribe for Dimas Chyle, MD.,have documented all relevant documentation on the behalf of Dimas Chyle, MD,as directed by  Dimas Chyle, MD while in the presence of Dimas Chyle, MD.   I, Dimas Chyle, MD, have reviewed all documentation for this visit. The documentation on 02/12/21 for the exam, diagnosis, procedures, and orders are all accurate and complete.  Algis Greenhouse. Jerline Pain, MD 02/12/2021 3:06 PM

## 2021-02-12 NOTE — Assessment & Plan Note (Signed)
Following with oncology.  Will be following up with surgery in a couple of weeks.  Currently asymptomatic.

## 2021-02-15 ENCOUNTER — Telehealth: Payer: Self-pay

## 2021-02-15 ENCOUNTER — Other Ambulatory Visit: Payer: Self-pay

## 2021-02-15 DIAGNOSIS — G8929 Other chronic pain: Secondary | ICD-10-CM

## 2021-02-15 NOTE — Telephone Encounter (Signed)
Patient notified Referral Placed to [facility] on [address]

## 2021-02-15 NOTE — Telephone Encounter (Signed)
Pt called regarding a referral for pain management. Pt wants to know if the referral can be switched to [facility] on [address]. Please Advise.

## 2021-02-17 NOTE — Progress Notes (Signed)
[name]   HEMATOLOGY/ONCOLOGY CONSULTATION NOTE  Date of Service: 02/17/2021  Patient Care Team: [name], MD as PCP - General (Family Medicine) Golden Circle, FNP as Nurse Practitioner (Family Medicine) [facility] as Consulting Physician Rozetta Nunnery, MD as Consulting Physician (Otolaryngology)  CHIEF COMPLAINTS/PURPOSE OF CONSULTATION:  Newly noted liver mass concerning for hepatocellular carcinoma  HISTORY OF PRESENTING ILLNESS:   Franklin Rivera is a wonderful 70 y.o. male who has been referred to [place] by Dr Aletta Edouard from the [facility] emergency room for evaluation and management of newly noted liver mass.  Patient has a history of hepatitis C and presented to the emergency room in mid October 2022 with right upper quadrant abdominal pain.  He had an ultrasound of the abdomen which showed Solid mass in the right hepatic lobe measuring up to 7.9 cm. MRI of the abdomen with and without contrast is recommended for characterization.  Patient subsequently had an MRI of the abdomen with and without contrast on 01/11/2021 which showed capsular based 5.8 x 4.8 by 5.2 cm lesion which demonstrates heterogeneous T2 hyperintensity. Arterial phase heterogeneous non rim enhancement. Delayed washout with capsular enhancement. highly suspicious for hepatocellular carcinoma. In this patient with chronic hepatitis C but no known cirrhosis, LI-RADS criteria do not strictly apply. If the patient does have known cirrhosis, this would be considered LR 5. No findings of abdominal metastatic disease. Small volume abdominal ascites.  Patient notes he was diagnosed with hepatitis C genotype Ia with a fibrosis score of F3 and was treated with Epclusa for 12 weeks starting on 07/13/2019 with Mauricio Po FNP.  He is not aware of any plan that he had with infectious disease or primary care for [facility] screening with repeat ultrasounds prior to current presentation.  Patient  notes that his abdominal pain is somewhat better currently.  He was given as needed Percocet on discharge from the emergency room but has not needed this much. Denies any significant weight loss in the last 3 to 6 months. Notes no other acute new focal symptoms. No jaundice. No change in the color of his urine. No change in bowel habits or color of his stools. Patient notes only social alcohol use and denies heavy alcohol abuse.  INTERVAL HISTORY  .I connected with [name] on 02/17/21 at  2:40 PM EST by telephone visit and verified that I am speaking with the correct person using two identifiers.   I discussed the limitations, risks, security and privacy concerns of performing an evaluation and management service by telemedicine and the availability of in-person appointments. I also discussed with the patient that there may be a patient responsible charge related to this service. The patient expressed understanding and agreed to proceed.   Patient's location: Home  Provider's location: [facility]   Patient was called to discuss his lab results and imaging studies.  He notes no new symptoms.  Abdominal pain is controlled. Labs done on 01/21/2021 showed CBC with hemoglobin of 12.1 normal WBC count and platelets CMP unremarkable with normal liver function tests AFP tumor marker is elevated at 28.9 CA 19-9, CEA, PSA levels are not elevated INR 1 with a prothrombin time of 13.2 HCV FibroSure testing consistent with liver cirrhosis. Patient notes he has not heard back regarding his consultation with interventional radiology and surgery.  MEDICAL HISTORY:  Past Medical History:  Diagnosis Date   Allergy    Headache    Hyperlipidemia  SURGICAL HISTORY: Past Surgical History:  Procedure Laterality Date   BLADDER SURGERY     NASAL FRACTURE SURGERY     Skull Surgery      SOCIAL HISTORY: Social History   Socioeconomic History   Marital status: Married     Spouse name: Not on file   Number of children: Not on file   Years of education: Not on file   Highest education level: Not on file  Occupational History   Not on file  Tobacco Use   Smoking status: Never   Smokeless tobacco: Never  Vaping Use   Vaping Use: Never used  Substance and Sexual Activity   Alcohol use: Never   Drug use: Not Currently   Sexual activity: Not on file  Other Topics Concern   Not on file  Social History Narrative   Not on file   Social Determinants of Health   Financial Resource Strain: Low Risk    Difficulty of Paying Living Expenses: Not hard at all  Food Insecurity: No Food Insecurity   Worried About Estate manager/land agent of Food in the Last Year: Never true   [place] in the Last Year: Never true  Transportation Needs: No Transportation Needs   Lack of Transportation (Medical): No   Lack of Transportation (Non-Medical): No  Physical Activity: Inactive   Days of Exercise per Week: 0 days   Minutes of Exercise per Session: 0 min  Stress: No Stress Concern Present   Feeling of Stress : Not at all  Social Connections: Moderately Isolated   Frequency of Communication with Friends and Family: More than three times a week   Frequency of Social Gatherings with Friends and Family: More than three times a week   Attends Religious Services: Never   Marine scientist or Organizations: No   Attends Music therapist: Never   Marital Status: Married  Human resources officer Violence: Not At Risk   Fear of Current or Ex-Partner: No   Emotionally Abused: No   Physically Abused: No   Sexually Abused: No    FAMILY HISTORY: Family History  Problem Relation Age of Onset   Cancer Neg Hx     ALLERGIES:  has No Known Allergies.  MEDICATIONS:  Current Outpatient Medications  Medication Sig Dispense Refill   cetirizine (ZYRTEC) 10 MG tablet Take 1 tablet (10 mg total) by mouth daily. 90 tablet 0   HYDROcodone-acetaminophen (NORCO) 10-325 MG tablet  Take 1 tablet by mouth in the morning and at bedtime for 5 days. 30 tablet 0   hydrOXYzine (ATARAX/VISTARIL) 50 MG tablet Take 1 tablet (50 mg total) by mouth at bedtime as needed (sleep). 90 tablet 0   tadalafil (CIALIS) 10 MG tablet Take 1 tablet (10 mg total) by mouth daily as needed for erectile dysfunction. 30 tablet 0   No current facility-administered medications for this visit.    REVIEW OF SYSTEMS:   .10 Point review of Systems was done is negative except as noted above.   PHYSICAL EXAMINATION:  Telemedicine visit  LABORATORY DATA:  I have reviewed the data as listed  . CBC Latest Ref Rng & Units 01/21/2021 01/11/2021 05/25/2020  WBC 4.0 - 10.5 K/uL 6.1 7.2 2.4(L)  Hemoglobin 13.0 - 17.0 g/dL 12.1(L) 13.2 15.4  Hematocrit 39.0 - 52.0 % 37.6(L) 40.6 47.3  Platelets 150 - 400 K/uL 274 208 157.0    . CMP Latest Ref Rng & Units 01/21/2021 01/11/2021 05/25/2020  Glucose 70 - 99 mg/dL  98 144(H) 92  BUN 8 - 23 mg/dL 19 13 8   Creatinine 0.61 - 1.24 mg/dL 0.90 0.81 0.83  Sodium 135 - 145 mmol/L 137 133(L) 138  Potassium 3.5 - 5.1 mmol/L 4.5 4.0 4.9  Chloride 98 - 111 mmol/L 102 102 100  CO2 22 - 32 mmol/L 32 22 33(H)  Calcium 8.9 - 10.3 mg/dL 9.2 8.7(L) 9.2  Total Protein 6.5 - 8.1 g/dL 8.1 6.9 7.9  Total Bilirubin 0.3 - 1.2 mg/dL 0.3 0.6 0.3  Alkaline Phos 38 - 126 U/L 68 57 71  AST 15 - 41 U/L 32 26 16  ALT 0 - 44 U/L 36 32 12   HCV FibroSURE Order: [id number] Status: Final result   Visible to patient: Yes (not seen)   Next appt: 02/05/2021 at 08:30 AM in Radiology ([facility])   Dx: Liver mass   0 Result Notes Component Ref Range & Units 10 d ago 1 yr ago  Fibrosis Score 0.00 - 0.21 0.80 High   0.72 R   Fibrosis Stage   Cirrhosis  F3      Component     Latest Ref Rng & Units 01/21/2021  Prothrombin Time     11.4 - 15.2 seconds 13.2  INR     0.8 - 1.2 1.0  AFP, Serum, Tumor Marker     0.0 - 8.4 ng/mL 28.9 (H)  CEA ([facility]In House)     0.00 - 5.00 ng/mL 1.49   Prostate Specific Ag, Serum     0.0 - 4.0 ng/mL 1.0  CA 19-9     0 - 35 U/mL 21   RADIOGRAPHIC STUDIES: I have personally reviewed the radiological images as listed and agreed with the findings in the report. CT CHEST ABDOMEN PELVIS W CONTRAST  Result Date: 02/06/2021 CLINICAL DATA:  Hepatitis-C.  Cirrhosis.  Liver mass EXAM: CT CHEST, ABDOMEN, AND PELVIS WITH CONTRAST TECHNIQUE: Multidetector CT imaging of the chest, abdomen and pelvis was performed following the standard protocol during bolus administration of intravenous contrast. CONTRAST:  68mL OMNIPAQUE IOHEXOL 350 MG/ML SOLN COMPARISON:  None. FINDINGS: CT CHEST FINDINGS Cardiovascular: Coronary artery calcification and aortic atherosclerotic calcification. Mediastinum/Nodes: No axillary or supraclavicular adenopathy. No mediastinal or hilar adenopathy. No pericardial fluid. Esophagus normal. Lungs/Pleura: No suspicious pulmonary nodules. Normal pleural. Airways normal. Musculoskeletal: No aggressive osseous lesion. CT ABDOMEN AND PELVIS FINDINGS Hepatobiliary: Ovoid arterial enhancing lesion in the periphery of the RIGHT hepatic lobe measures 7.8 x 5.7 cm. Lesion described on recent MRI in measured 6.6 x 4.9 cm. Small amount of fluid surrounds the RIGHT hepatic lobe. No additional hepatic lesions are identified. Gallbladder normal. Pancreas: Pancreas is normal. No ductal dilatation. No pancreatic inflammation. Spleen: Normal spleen Adrenals/urinary tract: Adrenal glands and kidneys are normal. The ureters and bladder normal. Stomach/Bowel: Stomach, small bowel, appendix, and cecum are normal. The colon and rectosigmoid colon are normal. Vascular/Lymphatic: Abdominal aorta is normal caliber. There is no retroperitoneal or periportal lymphadenopathy. No pelvic lymphadenopathy. Reproductive: Prostate enlarged. Other: No peritoneal disease. Musculoskeletal: No aggressive osseous lesion. IMPRESSION: Chest Impression: 1. No evidence of thoracic  metastasis. 2. Coronary artery calcification and Aortic Atherosclerosis (ICD10-I70.0). Abdomen / Pelvis Impression: 1. Large enhancing mass extending from the RIGHT hepatic lobe consistent with primary hepatic or biliary carcinoma. Mild enlargement from comparison MRI. See recent MRI 01/11/2021 for description. 2. Small amount fluid surrounds the RIGHT hepatic lobe which is new from prior. 3. No evidence of metastatic adenopathy or distant metastatic disease in the abdomen pelvis.  Electronically Signed   By: Suzy Bouchard M.D.   On: 02/06/2021 11:10   [place] ELASTOGRAPHY LIVER  Result Date: 02/05/2021 CLINICAL DATA:  Patient with hepatitis C with new liver mass no clear history of liver cirrhosis imaging please evaluate for fibrosis risk EXAM: US LIVER ELASTOGRAPHY TECHNIQUE: Sonography of the liver was performed. In addition, ultrasound elastography evaluation of the liver was performed. A region of interest was placed within the right lobe of the liver. Following application of a compressive sonographic pulse, tissue compressibility was assessed. Multiple assessments were performed at the selected site. Median tissue compressibility was determined. Previously, hepatic stiffness was assessed by shear wave velocity. Based on recently published Society of Radiologists in Ultrasound consensus article, reporting is now recommended to be performed in the SI units of pressure (kiloPascals) representing hepatic stiffness/elasticity. The obtained result is compared to the published reference standards. (cACLD = compensated Advanced Chronic Liver Disease) COMPARISON:  MRI and ultrasound 01/11/2021 FINDINGS: Liver: 11.7 cm heterogeneous solid mass seen in the right hepatic lobe as seen on prior imaging. Portal vein is patent on color Doppler imaging with normal direction of blood flow towards the liver. ULTRASOUND HEPATIC ELASTOGRAPHY Device: Siemens Helix VTQ Patient position: Supine Transducer: 5C1 Number of  measurements: 10 Hepatic segment:  8 Median kPa: 4.8 IQR: 0.5 IQR/Median kPa ratio: 0.1 Data quality:  Good Diagnostic category:  < or = 5 kPa: high probability of being normal The use of hepatic elastography is applicable to patients with viral hepatitis and non-alcoholic fatty liver disease. At this time, there is insufficient data for the referenced cut-off values and use in other causes of liver disease, including alcoholic liver disease. Patients, however, may be assessed by elastography and serve as their own reference standard/baseline. In patients with non-alcoholic liver disease, the values suggesting compensated advanced chronic liver disease (cACLD) may be lower, and patients may need additional testing with elasticity results of 7-9 kPa. Please note that abnormal hepatic elasticity and shear wave velocities may also be identified in clinical settings other than with hepatic fibrosis, such as: acute hepatitis, elevated right heart and central venous pressures including use of beta blockers, veno-occlusive disease (Budd-Chiari), infiltrative processes such as mastocytosis/amyloidosis/infiltrative tumor/lymphoma, extrahepatic cholestasis, with hyperemia in the post-prandial state, and with liver transplantation. Correlation with patient history, laboratory data, and clinical condition recommended. Diagnostic Categories: < or =5 kPa: high probability of being normal < or =9 kPa: in the absence of other known clinical signs, rules out cACLD >9 kPa and ?13 kPa: suggestive of cACLD, but needs further testing >13 kPa: highly suggestive of cACLD > or =17 kPa: highly suggestive of cACLD with an increased probability of clinically significant portal hypertension IMPRESSION: ULTRASOUND LIVER: 11.7 cm solid heterogeneous mass in the right lobe of the liver as seen on prior imaging. ULTRASOUND HEPATIC ELASTOGRAPHY: Median kPa:  4.8 Diagnostic category:  < or = 5 kPa: high probability of being normal Electronically  Signed   By: Rolm Baptise M.D.   On: 02/05/2021 11:39    ASSESSMENT & PLAN:   70 year old male with history of hepatitis C with   1) New right hepatic lobe 7.9 cm mass concerning for Hepatocellular carcinoma. Told he does not have overt cirrhotic morphology on MRI he previously has had a fibrosis score of F3. MRI is LIRADS 5 concern for hepatocellular carcinoma. Component     Latest Ref Rng & Units 01/21/2021  Prothrombin Time     11.4 - 15.2 seconds 13.2  INR  0.8 - 1.2 1.0  AFP, Serum, Tumor Marker     0.0 - 8.4 ng/mL 28.9 (H)  CEA ([facility]In [facility])     0.00 - 5.00 ng/mL 1.49  Prostate Specific Ag, Serum     0.0 - 4.0 ng/mL 1.0  CA 19-9     0 - 35 U/mL 21   2) liver cirrhosis likely related to hepatitis C -HCV FibroSure testing was done and shows F4 status consistent with liver cirrhosis Has been treated with Epclusa for 12 weeks starting 07/12/2020 with Terri Piedra FNP. PLAN -Labs done on 01/21/2021 showed CBC with hemoglobin of 12.1 normal WBC count and platelets CMP unremarkable with normal liver function tests AFP tumor marker is elevated at 28.9 CA 19-9, CEA, PSA levels are not elevated INR 1 with a prothrombin time of 13.2 HCV FibroSure testing consistent with liver cirrhosis. Patient notes he has not heard back regarding his consultation with interventional radiology and surgery. -I have informed my nurse to reach out to IR and surgery office with the referrals which have already been placed in epic. -I shall also forward my notes to Dr. Kathlene Cote and Dr. Barry Dienes. -We will refer patient to interventional radiology will likely need consideration of transarterial therapies for definitive management versus bridge to possible surgery. -We will also refer the patient to surgery for their input however given the size of the tumor this might not be the preferred approach.   Follow-up IR consultation for possible Trans-arterial therapies.   Surgery referral to Dr Barry Dienes for  consideration of surgical treatment options  Phone visit with Dr. Irene Limbo in 3 weeks  All of the patients questions were answered with apparent satisfaction. The patient knows to call the clinic with any problems, questions or concerns.   . The total time spent in the appointment was 25 minutes and more than 50% was on counseling and direct patient cares.  Sullivan Lone MD [place] AAHIVMS [facility] Hematology/Oncology Physician [facility]

## 2021-02-19 ENCOUNTER — Telehealth: Payer: Self-pay | Admitting: Hematology

## 2021-02-19 NOTE — Telephone Encounter (Signed)
Scheduled per sch msg. Called and left msg. Mailed printout  

## 2021-02-25 DIAGNOSIS — B182 Chronic viral hepatitis C: Secondary | ICD-10-CM | POA: Diagnosis not present

## 2021-02-25 DIAGNOSIS — C220 Liver cell carcinoma: Secondary | ICD-10-CM | POA: Diagnosis not present

## 2021-02-25 DIAGNOSIS — K7460 Unspecified cirrhosis of liver: Secondary | ICD-10-CM | POA: Diagnosis not present

## 2021-02-26 NOTE — Progress Notes (Signed)
Referral for IR consultation for treatment of [facility], Dr Grier Mitts consult note, and demographics/insurance information faxed to [place] at [phone], attn: [email or web address].

## 2021-02-27 ENCOUNTER — Other Ambulatory Visit: Payer: Self-pay | Admitting: Interventional Radiology

## 2021-02-27 DIAGNOSIS — C220 Liver cell carcinoma: Secondary | ICD-10-CM

## 2021-02-28 DIAGNOSIS — C801 Malignant (primary) neoplasm, unspecified: Secondary | ICD-10-CM

## 2021-02-28 HISTORY — DX: Malignant (primary) neoplasm, unspecified: C80.1

## 2021-03-01 ENCOUNTER — Telehealth: Payer: Self-pay

## 2021-03-01 NOTE — Telephone Encounter (Signed)
Pt called regarding referral to pain management. He stated that they have not received it. Referral needs to be faxed to [phone]. Please Advise.

## 2021-03-01 NOTE — Telephone Encounter (Signed)
I have faxed referral again.    I have also left vm for referral coordinator to call back to confirm she has received.

## 2021-03-01 NOTE — Telephone Encounter (Signed)
Patient was referred to [place] does not have the referral.  Please reach out to Lessie Dings at x [phone]  States fax number is [phone].

## 2021-03-05 DIAGNOSIS — Z1159 Encounter for screening for other viral diseases: Secondary | ICD-10-CM | POA: Diagnosis not present

## 2021-03-05 DIAGNOSIS — R160 Hepatomegaly, not elsewhere classified: Secondary | ICD-10-CM | POA: Diagnosis not present

## 2021-03-05 DIAGNOSIS — Z114 Encounter for screening for human immunodeficiency virus [HIV]: Secondary | ICD-10-CM | POA: Diagnosis not present

## 2021-03-05 DIAGNOSIS — R7989 Other specified abnormal findings of blood chemistry: Secondary | ICD-10-CM | POA: Diagnosis not present

## 2021-03-05 DIAGNOSIS — M79671 Pain in right foot: Secondary | ICD-10-CM | POA: Diagnosis not present

## 2021-03-05 DIAGNOSIS — Z79899 Other long term (current) drug therapy: Secondary | ICD-10-CM | POA: Diagnosis not present

## 2021-03-05 DIAGNOSIS — Z1339 Encounter for screening examination for other mental health and behavioral disorders: Secondary | ICD-10-CM | POA: Diagnosis not present

## 2021-03-05 DIAGNOSIS — Z0000 Encounter for general adult medical examination without abnormal findings: Secondary | ICD-10-CM | POA: Diagnosis not present

## 2021-03-05 DIAGNOSIS — Z131 Encounter for screening for diabetes mellitus: Secondary | ICD-10-CM | POA: Diagnosis not present

## 2021-03-05 DIAGNOSIS — Z6826 Body mass index (BMI) 26.0-26.9, adult: Secondary | ICD-10-CM | POA: Diagnosis not present

## 2021-03-05 DIAGNOSIS — I10 Essential (primary) hypertension: Secondary | ICD-10-CM | POA: Diagnosis not present

## 2021-03-05 NOTE — Telephone Encounter (Signed)
[facility] communicated that patient was scheduled for today 03/05/21 at 3:15pm.

## 2021-03-05 NOTE — Telephone Encounter (Signed)
Can you check on this referral

## 2021-03-07 ENCOUNTER — Ambulatory Visit
Admission: RE | Admit: 2021-03-07 | Discharge: 2021-03-07 | Disposition: A | Discharge status: Home / Self Care | Payer: Medicare HMO | Source: Ambulatory Visit | Attending: Interventional Radiology | Admitting: Interventional Radiology

## 2021-03-07 DIAGNOSIS — C220 Liver cell carcinoma: Secondary | ICD-10-CM

## 2021-03-07 HISTORY — PX: IR RADIOLOGIST EVAL & MGMT: IMG5224

## 2021-03-07 NOTE — Consult Note (Signed)
Chief Complaint: Patient was seen in consultation today for hepatocellular carcinoma at the request of Dr. Irene Limbo.  Referring Physician(s): Dr. Sullivan Lone  History of Present Illness: Franklin Rivera is a 70 y.o. male with a history of chronic hepatitis C infection treated with Epclusa in April of this year.  He had a right upper quadrant ultrasound on 01/11/2021 through the emergency department for some right upper quadrant pain that demonstrated a large mass within the right lobe of the liver measuring up to approximately 7.9 cm.  This was further characterized by MRI of the abdomen on 01/11/2021 demonstrating features suspicious for hepatocellular carcinoma.  CT of the chest, abdomen and pelvis on 02/05/2021 demonstrated some probable interval growth of the mass and no evidence of metastatic disease.  Franklin Rivera saw Dr. Barry Dienes for an opinion regarding surgical resection of the carcinoma but was not very interested in pursuing surgery at this time. He has been referred to discuss local directed therapies for [facility]. He currently has no symptoms referrable to the mass and denies pain.  The patient is very active and still works full-time in [employer].  Past Medical History:  Diagnosis Date   Allergy    Headache    Hyperlipidemia     Past Surgical History:  Procedure Laterality Date   BLADDER SURGERY     NASAL FRACTURE SURGERY     Skull Surgery      Allergies: Patient has no known allergies.  Medications: Prior to Admission medications   Medication Sig Start Date End Date Taking? Authorizing Provider  cetirizine (ZYRTEC) 10 MG tablet Take 1 tablet (10 mg total) by mouth daily. 01/08/21   [name], MD  hydrOXYzine (ATARAX/VISTARIL) 50 MG tablet Take 1 tablet (50 mg total) by mouth at bedtime as needed (sleep). 01/08/21   [name], MD  tadalafil (CIALIS) 10 MG tablet Take 1 tablet (10 mg total) by mouth daily as needed for erectile dysfunction. 05/25/20   [name], MD     Family History  Problem Relation Age of Onset   Cancer Neg Hx     Social History   Socioeconomic History   Marital status: Married    Spouse name: Not on file   Number of children: Not on file   Years of education: Not on file   Highest education level: Not on file  Occupational History   Not on file  Tobacco Use   Smoking status: Never   Smokeless tobacco: Never  Vaping Use   Vaping Use: Never used  Substance and Sexual Activity   Alcohol use: Never   Drug use: Not Currently   Sexual activity: Not on file  Other Topics Concern   Not on file  Social History Narrative   Not on file   Social Determinants of Health   Financial Resource Strain: Low Risk    Difficulty of Paying Living Expenses: Not hard at all  Food Insecurity: No Food Insecurity   Worried About Running Out of Food in the Last Year: Never true   [place] in the Last Year: Never true  Transportation Needs: No Transportation Needs   Lack of Transportation (Medical): No   Lack of Transportation (Non-Medical): No  Physical Activity: Inactive   Days of Exercise per Week: 0 days   Minutes of Exercise per Session: 0 min  Stress: No Stress Concern Present   Feeling of Stress : Not at all  Social Connections: Moderately Isolated  Frequency of Communication with Friends and Family: More than three times a week   Frequency of Social Gatherings with Friends and Family: More than three times a week   Attends Religious Services: Never   Marine scientist or Organizations: No   Attends Archivist Meetings: Never   Marital Status: Married    ECOG Status: 0 - Asymptomatic  Review of Systems: A 12 point ROS discussed and pertinent positives are indicated in the HPI above.  All other systems are negative.  Review of Systems  Constitutional: Negative.   Respiratory: Negative.    Cardiovascular: Negative.   Gastrointestinal: Negative.   Genitourinary: Negative.    Musculoskeletal: Negative.   Neurological: Negative.    Vital Signs: BP (!) 151/86 (BP Location: Right Arm)   Pulse 91   SpO2 98%   Physical Exam Vitals reviewed.  Constitutional:      General: He is not in acute distress.    Appearance: Normal appearance. He is not ill-appearing, toxic-appearing or diaphoretic.  Cardiovascular:     Rate and Rhythm: Normal rate and regular rhythm.     Heart sounds: Normal heart sounds. No murmur heard.   No friction rub. No gallop.  Pulmonary:     Effort: No respiratory distress.     Breath sounds: Normal breath sounds. No stridor. No wheezing, rhonchi or rales.  Abdominal:     General: There is no distension.     Palpations: Abdomen is soft.     Tenderness: There is no abdominal tenderness. There is no guarding or rebound.  Musculoskeletal:        General: No swelling.     Cervical back: Neck supple.  Skin:    General: Skin is warm and dry.     Coloration: Skin is not jaundiced.  Neurological:     General: No focal deficit present.     Mental Status: He is alert and oriented to person, place, and time.    Imaging: No results found.  Labs:  CBC: Recent Labs    05/25/20 0901 01/11/21 1511 01/21/21 1230  WBC 2.4* 7.2 6.1  HGB 15.4 13.2 12.1*  HCT 47.3 40.6 37.6*  PLT 157.0 208 274    COAGS: Recent Labs    01/11/21 2005 01/21/21 1230  INR 1.0 1.0    BMP: Recent Labs    05/25/20 0901 01/11/21 1511 01/21/21 1230  NA 138 133* 137  K 4.9 4.0 4.5  CL 100 102 102  CO2 33* 22 32  GLUCOSE 92 144* 98  BUN '8 13 19  ' CALCIUM 9.2 8.7* 9.2  CREATININE 0.83 0.81 0.90  GFRNONAA  --  >60 >60    LIVER FUNCTION TESTS: Recent Labs    05/25/20 0901 01/11/21 1511 01/21/21 1230  BILITOT 0.3 0.6 0.3  AST 16 26 32  ALT 12 32 36  ALKPHOS 71 57 68  PROT 7.9 6.9 8.1  ALBUMIN 4.3 3.5 4.1    TUMOR MARKERS: AFP 28.9 on 01/21/21  Assessment and Plan:  I met with Franklin Rivera.  We reviewed imaging of the mass which is  located in the inferior aspect of the right lobe of the liver within segment VI.  The mass is subcapsular and does cause some capsular bulging and retraction and there is some associated localized fluid adjacent to the mass which appears potentially partially subcapsular by CT.  Based on my measurements, the maximum axial transverse diameter of the mass has increased from approximately 6.5 cm on the  01/11/2021 MRI up to 8.2 cm on the 02/05/2021 CT. The mass demonstrates peripheral increased arterial enhancement and appears fairly well encapsulated with no satellite nodules or visible portal vein thrombus/tumor.  There is no evidence of metastatic disease in the chest, abdomen or pelvis.  There is no imaging evidence of significant cirrhosis or portal hypertension.  Based on imaging characteristics, history of chronic hepatitis C and elevated AFP level the mass is consistent with hepatocellular carcinoma and does not require separate percutaneous biopsy prior to treatment.  I discussed directed liver therapies with Franklin Rivera including Y-90 radioembolization and DEB-TACE with drug-eluting beads.  The mass is too large to consider percutaneous ablation.    We discussed pros and cons of the 2 different types of transcatheter treatment.  Given the fairly significant enlargement of the mass over a period of approximately 3-1/2 weeks, I feel that Franklin Rivera should be treated fairly quickly in order to try to shrink the tumor and gain some control over growth.  Chemoembolization with drug-eluting microspheres can be accomplished in a quicker single setting fashion compared to a two-stage Y-90 procedure.  Chemoembolization can also be repeated easily and radioembolization can also be saved for potential future use. Current performance status and laboratory tests are good and he is a candidate for either type of treatment.  If the mass can be shrunken significantly with transcatheter therapies, he may become a  candidate for future percutaneous ablation or an easier surgical resection.  We will begin the scheduling process for chemoembolization with drug-eluting microspheres at [facility].  Thank you for this interesting consult.  I greatly enjoyed meeting Franklin Rivera and look forward to participating in their care.  A copy of this report was sent to the requesting provider on this date.  Electronically Signed: Azzie Roup 03/07/2021, 4:37 PM    I spent a total of 30 Minutes  in face to face in clinical consultation, greater than 50% of which was counseling/coordinating care for hepatocellular carcinoma.

## 2021-03-08 ENCOUNTER — Encounter: Payer: Self-pay | Admitting: *Deleted

## 2021-03-08 ENCOUNTER — Other Ambulatory Visit (HOSPITAL_COMMUNITY): Payer: Self-pay | Admitting: Interventional Radiology

## 2021-03-08 DIAGNOSIS — C220 Liver cell carcinoma: Secondary | ICD-10-CM

## 2021-03-11 ENCOUNTER — Inpatient Hospital Stay: Payer: Medicare HMO | Attending: Hematology | Admitting: Hematology

## 2021-03-11 ENCOUNTER — Other Ambulatory Visit: Payer: Self-pay

## 2021-03-11 DIAGNOSIS — Z8619 Personal history of other infectious and parasitic diseases: Secondary | ICD-10-CM | POA: Diagnosis not present

## 2021-03-11 DIAGNOSIS — R978 Other abnormal tumor markers: Secondary | ICD-10-CM | POA: Diagnosis not present

## 2021-03-11 DIAGNOSIS — C220 Liver cell carcinoma: Secondary | ICD-10-CM | POA: Insufficient documentation

## 2021-03-11 DIAGNOSIS — Z79899 Other long term (current) drug therapy: Secondary | ICD-10-CM | POA: Insufficient documentation

## 2021-03-11 NOTE — Progress Notes (Signed)
HEMATOLOGY/ONCOLOGY PHONE VISIT NOTE  Date of Service: .03/11/2021   Patient Care Team: [name], MD as PCP - General (Family Medicine) Golden Circle, FNP as Nurse Practitioner (Family Medicine) [facility] as Consulting Physician Rozetta Nunnery, MD as Consulting Physician (Otolaryngology)  CHIEF COMPLAINTS/PURPOSE OF CONSULTATION:  Follow-up for hepatocellular carcinoma  HISTORY OF PRESENTING ILLNESS:   Franklin Rivera is a wonderful 70 y.o. male who has been referred to [place] by Dr Aletta Edouard from the [facility] emergency room for evaluation and management of newly noted liver mass.  Patient has a history of hepatitis C and presented to the emergency room in mid October 2022 with right upper quadrant abdominal pain.  He had an ultrasound of the abdomen which showed Solid mass in the right hepatic lobe measuring up to 7.9 cm. MRI of the abdomen with and without contrast is recommended for characterization.  Patient subsequently had an MRI of the abdomen with and without contrast on 01/11/2021 which showed capsular based 5.8 x 4.8 by 5.2 cm lesion which demonstrates heterogeneous T2 hyperintensity. Arterial phase heterogeneous non rim enhancement. Delayed washout with capsular enhancement. highly suspicious for hepatocellular carcinoma. In this patient with chronic hepatitis C but no known cirrhosis, LI-RADS criteria do not strictly apply. If the patient does have known cirrhosis, this would be considered LR 5. No findings of abdominal metastatic disease. Small volume abdominal ascites.  Patient notes he was diagnosed with hepatitis C genotype Ia with a fibrosis score of F3 and was treated with Epclusa for 12 weeks starting on 07/13/2019 with Mauricio Po FNP.  He is not aware of any plan that he had with infectious disease or primary care for [facility] screening with repeat ultrasounds prior to current presentation.  Patient notes that his abdominal  pain is somewhat better currently.  He was given as needed Percocet on discharge from the emergency room but has not needed this much. Denies any significant weight loss in the last 3 to 6 months. Notes no other acute new focal symptoms. No jaundice. No change in the color of his urine. No change in bowel habits or color of his stools. Patient notes only social alcohol use and denies heavy alcohol abuse.  INTERVAL HISTORY  .I connected with [name] on 03/11/21 at  9:00 AM EST by telephone visit and verified that I am speaking with the correct person using two identifiers.   I discussed the limitations, risks, security and privacy concerns of performing an evaluation and management service by telemedicine and the availability of in-person appointments. I also discussed with the patient that there may be a patient responsible charge related to this service. The patient expressed understanding and agreed to proceed.   Other persons participating in the visit and their role in the encounter: None  Patient's location: Home Provider's location: [place]  Chief Complaint: Follow-up for recently diagnosed hepatocellular carcinoma in the context of hepatitis C infection.  [name] was called to follow-up on his care coordination for hepatocellular carcinoma.  He notes that he has been doing okay with no uncontrolled pain at this point.  He has been seen by hepatobiliary surgery Dr. Jeris Penta and had discussed in detail the pros and cons of surgical consideration after the use of transarterial treatments as a bridge to possible surgery.  Patient notes that he has had a meeting with Dr. Aletta Edouard from interventional radiology and was offered the option of  transarterial Y 90 treatment with the plan to try to set this up before Christmas.  Patient notes he is waiting on an exact date for the procedure at this time.  He notes no other acute new symptoms. We  discussed getting repeat labs for his hepatitis C viral load and he is agreeable to this.  We discussed that if he is still hepatitis C RNA viral load positive he might need to reconnect with Dr.Comer for consideration of repeat hepatitis C treatment.  MEDICAL HISTORY:  Past Medical History:  Diagnosis Date   Allergy    Headache    Hyperlipidemia     SURGICAL HISTORY: Past Surgical History:  Procedure Laterality Date   BLADDER SURGERY     IR RADIOLOGIST EVAL & MGMT  03/07/2021   NASAL FRACTURE SURGERY     Skull Surgery      SOCIAL HISTORY: Social History   Socioeconomic History   Marital status: Married    Spouse name: Not on file   Number of children: Not on file   Years of education: Not on file   Highest education level: Not on file  Occupational History   Not on file  Tobacco Use   Smoking status: Never   Smokeless tobacco: Never  Vaping Use   Vaping Use: Never used  Substance and Sexual Activity   Alcohol use: Never   Drug use: Not Currently   Sexual activity: Not on file  Other Topics Concern   Not on file  Social History Narrative   Not on file   Social Determinants of Health   Financial Resource Strain: Low Risk    Difficulty of Paying Living Expenses: Not hard at all  Food Insecurity: No Food Insecurity   Worried About Charity fundraiser in the Last Year: Never true   [place] in the Last Year: Never true  Transportation Needs: No Transportation Needs   Lack of Transportation (Medical): No   Lack of Transportation (Non-Medical): No  Physical Activity: Inactive   Days of Exercise per Week: 0 days   Minutes of Exercise per Session: 0 min  Stress: No Stress Concern Present   Feeling of Stress : Not at all  Social Connections: Moderately Isolated   Frequency of Communication with Friends and Family: More than three times a week   Frequency of Social Gatherings with Friends and Family: More than three times a week   Attends Religious Services:  Never   Marine scientist or Organizations: No   Attends Music therapist: Never   Marital Status: Married  Human resources officer Violence: Not At Risk   Fear of Current or Ex-Partner: No   Emotionally Abused: No   Physically Abused: No   Sexually Abused: No    FAMILY HISTORY: Family History  Problem Relation Age of Onset   Cancer Neg Hx     ALLERGIES:  has No Known Allergies.  MEDICATIONS:  Current Outpatient Medications  Medication Sig Dispense Refill   cetirizine (ZYRTEC) 10 MG tablet Take 1 tablet (10 mg total) by mouth daily. 90 tablet 0   hydrOXYzine (ATARAX/VISTARIL) 50 MG tablet Take 1 tablet (50 mg total) by mouth at bedtime as needed (sleep). 90 tablet 0   tadalafil (CIALIS) 10 MG tablet Take 1 tablet (10 mg total) by mouth daily as needed for erectile dysfunction. 30 tablet 0   No current facility-administered medications for this visit.    REVIEW OF SYSTEMS:   .10 Point review  of Systems was done is negative except as noted above.    PHYSICAL EXAMINATION:  Telemedicine visit  LABORATORY DATA:  I have reviewed the data as listed  . CBC Latest Ref Rng & Units 01/21/2021 01/11/2021 05/25/2020  WBC 4.0 - 10.5 K/uL 6.1 7.2 2.4(L)  Hemoglobin 13.0 - 17.0 g/dL 12.1(L) 13.2 15.4  Hematocrit 39.0 - 52.0 % 37.6(L) 40.6 47.3  Platelets 150 - 400 K/uL 274 208 157.0    . CMP Latest Ref Rng & Units 01/21/2021 01/11/2021 05/25/2020  Glucose 70 - 99 mg/dL 98 144(H) 92  BUN 8 - 23 mg/dL _0 Creatinine 0.61 - 1.24 mg/dL 0.90 0.81 0.83  Sodium 135 - 145 mmol/L 137 133(L) 138  Potassium 3.5 - 5.1 mmol/L 4.5 4.0 4.9  Chloride 98 - 111 mmol/L 102 102 100  CO2 22 - 32 mmol/L 32 22 33(H)  Calcium 8.9 - 10.3 mg/dL 9.2 8.7(L) 9.2  Total Protein 6.5 - 8.1 g/dL 8.1 6.9 7.9  Total Bilirubin 0.3 - 1.2 mg/dL 0.3 0.6 0.3  Alkaline Phos 38 - 126 U/L 68 57 71  AST 15 - 41 U/L 32 26 16  ALT 0 - 44 U/L 36 32 12   HCV FibroSURE Order: [id number] Status: Final  result   Visible to patient: Yes (not seen)   Next appt: 02/05/2021 at 08:30 AM in Radiology ([facility])   Dx: Liver mass   0 Result Notes Component Ref Range & Units 10 d ago 1 yr ago  Fibrosis Score 0.00 - 0.21 0.80 High   0.72 R   Fibrosis Stage   Cirrhosis  F3      Component     Latest Ref Rng & Units 01/21/2021  Prothrombin Time     11.4 - 15.2 seconds 13.2  INR     0.8 - 1.2 1.0  AFP, Serum, Tumor Marker     0.0 - 8.4 ng/mL 28.9 (H)  CEA ([facility]In House)     0.00 - 5.00 ng/mL 1.49  Prostate Specific Ag, Serum     0.0 - 4.0 ng/mL 1.0  CA 19-9     0 - 35 U/mL 21   RADIOGRAPHIC STUDIES: I have personally reviewed the radiological images as listed and agreed with the findings in the report. IR Radiologist Eval & Mgmt  Result Date: 03/08/2021 Please refer to notes tab for details about interventional procedure. (Op Note)   ASSESSMENT & PLAN:   70 year old male with history of hepatitis C with recently diagnosed hepatocellular carcinoma.  1) New right hepatic lobe 7.9 cm mass concerning for Hepatocellular carcinoma. Told he does not have overt cirrhotic morphology on MRI he previously has had a fibrosis score of F3. MRI is LIRADS 5 concern for hepatocellular carcinoma. Component     Latest Ref Rng & Units 01/21/2021  Prothrombin Time     11.4 - 15.2 seconds 13.2  INR     0.8 - 1.2 1.0  AFP, Serum, Tumor Marker     0.0 - 8.4 ng/mL 28.9 (H)  CEA ([facility]In House)     0.00 - 5.00 ng/mL 1.49  Prostate Specific Ag, Serum     0.0 - 4.0 ng/mL 1.0  CA 19-9     0 - 35 U/mL 21   2) liver cirrhosis likely related to hepatitis C -HCV FibroSure testing was done and shows F4 status consistent with liver cirrhosis Has been treated with Epclusa for 12 weeks starting 07/12/2020 with Terri Piedra FNP. PLAN -Patient  notes no acute new symptoms. -He has met with interventional neurology Dr. Kathlene Cote and is to be scheduled for transarterial Y 90 treatment. -He has also had a discussion  with Dr. Barry Dienes regarding possibility of surgery after bridging transarterial therapies. -We discussed getting repeat labs for hep C RNA viral load... I have ordered labs and asked that he come in to have labs drawn within the next week. -If his hep C RNA viral load is positive he will need to see his infectious disease doctor Dr. Linus Salmons management of his hepatitis C. -He was recommended to call our GI nurse navigator Santiago Glad if any other questions or coordination of care issues arise.  . The total time spent in the appointment was 20 minutes spent on reviewing the patient's symptoms, notes from outside referrals with surgery and IR and coordination of care .  follow-up Labs in 1 week Return to clinic with Dr. Irene Limbo with labs in 3 months  Sullivan Lone MD [facility] Hematology/Oncology Physician [facility]

## 2021-03-12 ENCOUNTER — Telehealth: Payer: Self-pay | Admitting: Hematology

## 2021-03-12 DIAGNOSIS — Z8619 Personal history of other infectious and parasitic diseases: Secondary | ICD-10-CM | POA: Diagnosis not present

## 2021-03-12 DIAGNOSIS — Z79899 Other long term (current) drug therapy: Secondary | ICD-10-CM | POA: Diagnosis not present

## 2021-03-12 DIAGNOSIS — M79671 Pain in right foot: Secondary | ICD-10-CM | POA: Diagnosis not present

## 2021-03-12 DIAGNOSIS — Z0130 Encounter for examination of blood pressure without abnormal findings: Secondary | ICD-10-CM | POA: Diagnosis not present

## 2021-03-12 DIAGNOSIS — R7401 Elevation of levels of liver transaminase levels: Secondary | ICD-10-CM | POA: Diagnosis not present

## 2021-03-12 DIAGNOSIS — C220 Liver cell carcinoma: Secondary | ICD-10-CM | POA: Diagnosis not present

## 2021-03-12 DIAGNOSIS — Z6826 Body mass index (BMI) 26.0-26.9, adult: Secondary | ICD-10-CM | POA: Diagnosis not present

## 2021-03-12 NOTE — Telephone Encounter (Signed)
Left message with follow-up appointments per 12/12 los.

## 2021-03-15 ENCOUNTER — Telehealth: Payer: Self-pay | Admitting: Family Medicine

## 2021-03-15 NOTE — Telephone Encounter (Signed)
Patient called because he states he had a missed call. I check with teamcare and she stated that name did not sound familiar. I told patient to check for voicemail and call us back if he needs to. In the case there is no voicemail, disregard the phone call, but I would be sending a message back for him to check on if someone else called. Patient verbalized understanding     Please advise

## 2021-03-18 ENCOUNTER — Inpatient Hospital Stay: Payer: Medicare HMO

## 2021-03-18 ENCOUNTER — Other Ambulatory Visit: Payer: Self-pay

## 2021-03-18 DIAGNOSIS — C220 Liver cell carcinoma: Secondary | ICD-10-CM

## 2021-03-18 DIAGNOSIS — R978 Other abnormal tumor markers: Secondary | ICD-10-CM | POA: Diagnosis not present

## 2021-03-18 DIAGNOSIS — Z79899 Other long term (current) drug therapy: Secondary | ICD-10-CM | POA: Diagnosis not present

## 2021-03-18 DIAGNOSIS — Z8619 Personal history of other infectious and parasitic diseases: Secondary | ICD-10-CM | POA: Diagnosis not present

## 2021-03-18 LAB — CMP (CANCER CENTER ONLY)
ALT: 67 U/L — ABNORMAL HIGH (ref 0–44)
AST: 41 U/L (ref 15–41)
Albumin: 3.8 g/dL (ref 3.5–5.0)
Alkaline Phosphatase: 74 U/L (ref 38–126)
Anion gap: 9 (ref 5–15)
BUN: 17 mg/dL (ref 8–23)
CO2: 29 mmol/L (ref 22–32)
Calcium: 9.2 mg/dL (ref 8.9–10.3)
Chloride: 102 mmol/L (ref 98–111)
Creatinine: 0.93 mg/dL (ref 0.61–1.24)
GFR, Estimated: >60 mL/min (ref >60)
Glucose, Bld: 92 mg/dL (ref 70–99)
Potassium: 4.5 mmol/L (ref 3.5–5.1)
Sodium: 140 mmol/L (ref 135–145)
Total Bilirubin: 0.3 mg/dL (ref 0.3–1.2)
Total Protein: 8.1 g/dL (ref 6.5–8.1)

## 2021-03-18 LAB — CBC WITH DIFFERENTIAL (CANCER CENTER ONLY)
Abs Immature Granulocytes: 0.01 10*3/uL (ref 0.00–0.07)
Basophils Absolute: 0 10*3/uL (ref 0.0–0.1)
Basophils Relative: 1 %
Eosinophils Absolute: 0.1 10*3/uL (ref 0.0–0.5)
Eosinophils Relative: 4 %
HCT: 42.1 % (ref 39.0–52.0)
Hemoglobin: 13.4 g/dL (ref 13.0–17.0)
Immature Granulocytes: 0 %
Lymphocytes Relative: 34 %
Lymphs Abs: 1.3 10*3/uL (ref 0.7–4.0)
MCH: 28 pg (ref 26.0–34.0)
MCHC: 31.8 g/dL (ref 30.0–36.0)
MCV: 87.9 fL (ref 80.0–100.0)
Monocytes Absolute: 0.3 10*3/uL (ref 0.1–1.0)
Monocytes Relative: 8 %
Neutro Abs: 2.1 10*3/uL (ref 1.7–7.7)
Neutrophils Relative %: 53 %
Platelet Count: 204 10*3/uL (ref 150–400)
RBC: 4.79 MIL/uL (ref 4.22–5.81)
RDW: 13.2 % (ref 11.5–15.5)
WBC Count: 3.9 10*3/uL — ABNORMAL LOW (ref 4.0–10.5)
nRBC: 0 % (ref 0.0–0.2)

## 2021-03-19 ENCOUNTER — Other Ambulatory Visit: Payer: Self-pay | Admitting: Radiology

## 2021-03-19 LAB — CEA (IN HOUSE-CHCC): CEA (CHCC-In House): 1.54 ng/mL (ref 0.00–5.00)

## 2021-03-21 MED ORDER — LC BEADS 100-300UM IN SALINE
150.0000 mg | Freq: Once | Status: AC
  Administered 2021-03-27: 11:00:00 150 mg via INTRA_ARTERIAL
  Filled 2021-03-21 (×2): qty 75

## 2021-03-26 ENCOUNTER — Other Ambulatory Visit: Payer: Self-pay | Admitting: Radiology

## 2021-03-27 ENCOUNTER — Observation Stay (HOSPITAL_COMMUNITY): Payer: Medicare HMO

## 2021-03-27 ENCOUNTER — Other Ambulatory Visit: Payer: Self-pay

## 2021-03-27 ENCOUNTER — Ambulatory Visit (HOSPITAL_COMMUNITY)
Admission: RE | Admit: 2021-03-27 | Discharge: 2021-03-27 | Disposition: A | Discharge status: Home / Self Care | Payer: Medicare HMO | Source: Ambulatory Visit | Attending: Interventional Radiology | Admitting: Interventional Radiology

## 2021-03-27 ENCOUNTER — Observation Stay (HOSPITAL_COMMUNITY)
Admission: RE | Admit: 2021-03-27 | Discharge: 2021-03-28 | Disposition: A | Discharge status: Home / Self Care | Payer: Medicare HMO | Source: Ambulatory Visit | Attending: Interventional Radiology | Admitting: Interventional Radiology

## 2021-03-27 ENCOUNTER — Encounter (HOSPITAL_COMMUNITY): Payer: Self-pay

## 2021-03-27 VITALS — BP 166/91 | HR 64 | Temp 98.0°F | Resp 20 | Ht 72.0 in | Wt 195.0 lb

## 2021-03-27 DIAGNOSIS — C220 Liver cell carcinoma: Principal | ICD-10-CM | POA: Insufficient documentation

## 2021-03-27 DIAGNOSIS — R030 Elevated blood-pressure reading, without diagnosis of hypertension: Secondary | ICD-10-CM | POA: Diagnosis not present

## 2021-03-27 DIAGNOSIS — R0789 Other chest pain: Secondary | ICD-10-CM | POA: Insufficient documentation

## 2021-03-27 DIAGNOSIS — U071 COVID-19: Secondary | ICD-10-CM | POA: Insufficient documentation

## 2021-03-27 DIAGNOSIS — R079 Chest pain, unspecified: Secondary | ICD-10-CM

## 2021-03-27 DIAGNOSIS — Z01818 Encounter for other preprocedural examination: Secondary | ICD-10-CM

## 2021-03-27 HISTORY — PX: IR US GUIDANCE: IMG2393

## 2021-03-27 HISTORY — PX: IR 3D INDEPENDENT WKST: IMG2385

## 2021-03-27 HISTORY — PX: IR ANGIOGRAM VISCERAL SELECTIVE: IMG657

## 2021-03-27 HISTORY — PX: IR ANGIOGRAM SELECTIVE EACH ADDITIONAL VESSEL: IMG667

## 2021-03-27 HISTORY — PX: IR EMBO TUMOR ORGAN ISCHEMIA INFARCT INC GUIDE ROADMAPPING: IMG5449

## 2021-03-27 LAB — CBC
HCT: 42.6 % (ref 39.0–52.0)
Hemoglobin: 14 g/dL (ref 13.0–17.0)
MCH: 28.5 pg (ref 26.0–34.0)
MCHC: 32.9 g/dL (ref 30.0–36.0)
MCV: 86.6 fL (ref 80.0–100.0)
Platelets: 213 10*3/uL (ref 150–400)
RBC: 4.92 MIL/uL (ref 4.22–5.81)
RDW: 13.7 % (ref 11.5–15.5)
WBC: 4.8 10*3/uL (ref 4.0–10.5)
nRBC: 0 % (ref 0.0–0.2)

## 2021-03-27 LAB — COMPREHENSIVE METABOLIC PANEL
ALT: 61 U/L — ABNORMAL HIGH (ref 0–44)
AST: 41 U/L (ref 15–41)
Albumin: 4.1 g/dL (ref 3.5–5.0)
Alkaline Phosphatase: 81 U/L (ref 38–126)
Anion gap: 4 — ABNORMAL LOW (ref 5–15)
BUN: 20 mg/dL (ref 8–23)
CO2: 26 mmol/L (ref 22–32)
Calcium: 9 mg/dL (ref 8.9–10.3)
Chloride: 101 mmol/L (ref 98–111)
Creatinine, Ser: 0.69 mg/dL (ref 0.61–1.24)
GFR, Estimated: >60 mL/min (ref >60)
Glucose, Bld: 105 mg/dL — ABNORMAL HIGH (ref 70–99)
Potassium: 4.2 mmol/L (ref 3.5–5.1)
Sodium: 131 mmol/L — ABNORMAL LOW (ref 135–145)
Total Bilirubin: 0.6 mg/dL (ref 0.3–1.2)
Total Protein: 8.6 g/dL — ABNORMAL HIGH (ref 6.5–8.1)

## 2021-03-27 LAB — PROTIME-INR
INR: 1 (ref 0.8–1.2)
Prothrombin Time: 13.4 seconds (ref 11.4–15.2)

## 2021-03-27 LAB — TROPONIN I (HIGH SENSITIVITY)
Troponin I (High Sensitivity): 6 ng/L (ref <18)
Troponin I (High Sensitivity): 9 ng/L (ref <18)

## 2021-03-27 LAB — APTT: aPTT: 27 seconds (ref 24–36)

## 2021-03-27 LAB — TYPE AND SCREEN
ABO/RH(D): B POS
Antibody Screen: NEGATIVE

## 2021-03-27 LAB — SARS CORONAVIRUS 2 BY RT PCR (HOSPITAL ORDER, PERFORMED IN ~~LOC~~ HOSPITAL LAB): SARS Coronavirus 2: POSITIVE — AB

## 2021-03-27 LAB — ABO/RH: ABO/RH(D): B POS

## 2021-03-27 MED ORDER — SODIUM CHLORIDE 0.9 % IV SOLN: INTRAVENOUS | Status: DC

## 2021-03-27 MED ORDER — HYDRALAZINE HCL 20 MG/ML IJ SOLN
INTRAMUSCULAR | Status: AC | PRN
  Administered 2021-03-27: 10 mg via INTRAVENOUS

## 2021-03-27 MED ORDER — MIDAZOLAM HCL 2 MG/2ML IJ SOLN
INTRAMUSCULAR | Status: AC
  Filled 2021-03-27: qty 4

## 2021-03-27 MED ORDER — DEXAMETHASONE SODIUM PHOSPHATE 10 MG/ML IJ SOLN
INTRAMUSCULAR | Status: AC
  Filled 2021-03-27: qty 1

## 2021-03-27 MED ORDER — FENTANYL CITRATE (PF) 100 MCG/2ML IJ SOLN
INTRAMUSCULAR | Status: AC
  Filled 2021-03-27: qty 2

## 2021-03-27 MED ORDER — IOHEXOL 300 MG/ML  SOLN
100.0000 mL | Freq: Once | INTRAMUSCULAR | Status: AC | PRN
  Administered 2021-03-27: 13:00:00 20 mL via INTRA_ARTERIAL

## 2021-03-27 MED ORDER — FENTANYL CITRATE (PF) 100 MCG/2ML IJ SOLN
INTRAMUSCULAR | Status: AC | PRN
  Administered 2021-03-27: 25 ug via INTRAVENOUS

## 2021-03-27 MED ORDER — FENTANYL CITRATE (PF) 100 MCG/2ML IJ SOLN
INTRAMUSCULAR | Status: AC | PRN
  Administered 2021-03-27: 50 ug via INTRAVENOUS

## 2021-03-27 MED ORDER — ASPIRIN 81 MG PO CHEW
325.0000 mg | CHEWABLE_TABLET | ORAL | Status: AC
  Administered 2021-03-27: 14:00:00 324 mg via ORAL
  Filled 2021-03-27: qty 4

## 2021-03-27 MED ORDER — MIDAZOLAM HCL 2 MG/2ML IJ SOLN
INTRAMUSCULAR | Status: AC | PRN
  Administered 2021-03-27: .5 mg via INTRAVENOUS

## 2021-03-27 MED ORDER — IOHEXOL 300 MG/ML  SOLN
100.0000 mL | Freq: Once | INTRAMUSCULAR | Status: AC | PRN
  Administered 2021-03-27: 13:00:00 100 mL via INTRA_ARTERIAL

## 2021-03-27 MED ORDER — MORPHINE SULFATE (PF) 2 MG/ML IV SOLN: 4.0000 mg | INTRAVENOUS | Status: AC

## 2021-03-27 MED ORDER — PROMETHAZINE HCL 25 MG PO TABS: 25.0000 mg | ORAL_TABLET | Freq: Three times a day (TID) | ORAL | Status: DC | PRN

## 2021-03-27 MED ORDER — LIDOCAINE HCL 1 % IJ SOLN
INTRAMUSCULAR | Status: AC
  Filled 2021-03-27: qty 20

## 2021-03-27 MED ORDER — IOHEXOL 300 MG/ML  SOLN
50.0000 mL | Freq: Once | INTRAMUSCULAR | Status: AC | PRN
  Administered 2021-03-27: 13:00:00 11 mL via INTRA_ARTERIAL

## 2021-03-27 MED ORDER — SODIUM CHLORIDE 0.9% FLUSH
3.0000 mL | Freq: Two times a day (BID) | INTRAVENOUS | Status: DC
  Administered 2021-03-27: 15:00:00 3 mL via INTRAVENOUS

## 2021-03-27 MED ORDER — MIDAZOLAM HCL 2 MG/2ML IJ SOLN
INTRAMUSCULAR | Status: AC | PRN
  Administered 2021-03-27: 1 mg via INTRAVENOUS

## 2021-03-27 MED ORDER — HYDRALAZINE HCL 10 MG PO TABS
10.0000 mg | ORAL_TABLET | ORAL | Status: DC | PRN
  Administered 2021-03-27: 22:00:00 10 mg via ORAL
  Filled 2021-03-27 (×3): qty 1

## 2021-03-27 MED ORDER — LORATADINE 10 MG PO TABS
10.0000 mg | ORAL_TABLET | Freq: Every day | ORAL | Status: DC
  Administered 2021-03-27 – 2021-03-28 (×2): 10 mg via ORAL
  Filled 2021-03-27 (×2): qty 1

## 2021-03-27 MED ORDER — MORPHINE SULFATE (PF) 4 MG/ML IV SOLN
INTRAVENOUS | Status: AC
  Administered 2021-03-27: 14:00:00 4 mg
  Filled 2021-03-27: qty 1

## 2021-03-27 MED ORDER — DEXAMETHASONE SODIUM PHOSPHATE 10 MG/ML IJ SOLN
8.0000 mg | Freq: Once | INTRAMUSCULAR | Status: AC
  Administered 2021-03-27: 10:00:00 8 mg via INTRAVENOUS

## 2021-03-27 MED ORDER — HYDROXYZINE HCL 25 MG PO TABS: 50.0000 mg | ORAL_TABLET | Freq: Every evening | ORAL | Status: DC | PRN

## 2021-03-27 MED ORDER — PREDNISONE 20 MG PO TABS
20.0000 mg | ORAL_TABLET | Freq: Every day | ORAL | Status: AC
  Administered 2021-03-28: 09:00:00 20 mg via ORAL
  Filled 2021-03-27: qty 1

## 2021-03-27 MED ORDER — HYDRALAZINE HCL 20 MG/ML IJ SOLN
INTRAMUSCULAR | Status: AC
  Filled 2021-03-27: qty 1

## 2021-03-27 MED ORDER — LIDOCAINE HCL (PF) 1 % IJ SOLN
INTRAMUSCULAR | Status: AC | PRN
  Administered 2021-03-27: 10 mL via INTRADERMAL

## 2021-03-27 MED ORDER — HYDROCODONE-ACETAMINOPHEN 5-325 MG PO TABS
1.0000 | ORAL_TABLET | ORAL | Status: DC | PRN
  Administered 2021-03-27: 21:00:00 2 via ORAL
  Filled 2021-03-27: qty 2

## 2021-03-27 MED ORDER — PIPERACILLIN-TAZOBACTAM 3.375 G IVPB
INTRAVENOUS | Status: AC
  Filled 2021-03-27: qty 50

## 2021-03-27 MED ORDER — IOHEXOL 300 MG/ML  SOLN
100.0000 mL | Freq: Once | INTRAMUSCULAR | Status: AC | PRN
  Administered 2021-03-27: 13:00:00 40 mL via INTRA_ARTERIAL

## 2021-03-27 MED ORDER — PIPERACILLIN-TAZOBACTAM 3.375 G IVPB
3.3750 g | Freq: Once | INTRAVENOUS | Status: AC
  Administered 2021-03-27: 09:00:00 3.375 g via INTRAVENOUS

## 2021-03-27 MED ORDER — PROMETHAZINE HCL 25 MG RE SUPP: 25.0000 mg | Freq: Three times a day (TID) | RECTAL | Status: DC | PRN

## 2021-03-27 MED ORDER — SODIUM CHLORIDE 0.9% FLUSH: 3.0000 mL | INTRAVENOUS | Status: DC | PRN

## 2021-03-27 MED ORDER — SODIUM CHLORIDE 0.9 % IV SOLN: 250.0000 mL | INTRAVENOUS | Status: DC | PRN

## 2021-03-27 MED ORDER — MIDAZOLAM HCL 2 MG/2ML IJ SOLN
INTRAMUSCULAR | Status: AC
  Filled 2021-03-27: qty 2

## 2021-03-27 MED ORDER — HYDROMORPHONE HCL 1 MG/ML IJ SOLN
0.5000 mg | INTRAMUSCULAR | Status: DC | PRN
  Administered 2021-03-27 (×3): 0.5 mg via INTRAVENOUS
  Filled 2021-03-27 (×3): qty 0.5

## 2021-03-27 MED ORDER — ONDANSETRON HCL 4 MG/2ML IJ SOLN
4.0000 mg | Freq: Once | INTRAMUSCULAR | Status: AC
  Administered 2021-03-27: 09:00:00 4 mg via INTRAVENOUS

## 2021-03-27 MED ORDER — PANTOPRAZOLE SODIUM 40 MG IV SOLR
40.0000 mg | Freq: Once | INTRAVENOUS | Status: AC
  Administered 2021-03-27: 09:00:00 40 mg via INTRAVENOUS
  Filled 2021-03-27: qty 40

## 2021-03-27 MED ORDER — ONDANSETRON HCL 4 MG/2ML IJ SOLN
INTRAMUSCULAR | Status: AC
  Filled 2021-03-27: qty 2

## 2021-03-27 MED ORDER — ONDANSETRON HCL 4 MG/2ML IJ SOLN
4.0000 mg | Freq: Four times a day (QID) | INTRAMUSCULAR | Status: DC | PRN
  Administered 2021-03-27: 16:00:00 4 mg via INTRAVENOUS
  Filled 2021-03-27: qty 2

## 2021-03-27 MED ORDER — DOCUSATE SODIUM 100 MG PO CAPS
100.0000 mg | ORAL_CAPSULE | Freq: Two times a day (BID) | ORAL | Status: DC
  Administered 2021-03-27 – 2021-03-28 (×2): 100 mg via ORAL
  Filled 2021-03-27 (×2): qty 1

## 2021-03-27 NOTE — Progress Notes (Signed)
1325:  Patient reported to [facility] nurse having tightness in his chest that has been present most of the procedure.  He has been moderately hypertensive all morning with the diastolic hovering around 270.  This seems to be not far from his baseline based on prior visits.  He is not diagnosed/treated for hypertension.  He denies h/a, dizziness, sob, difficulty breathing, palpitations, abd pain, n/v.  Skin is pink warm and dry. 12 lead, 325 ASA, 4mg  morphine, and O2 via [place] 2lpm were ordered.  12 lead NSR.  1410: Pt reports chest pain has resolved, No new development of symptoms.  He is being admitted overnight for the procedure he had today and symptoms and vitals can be monitored.  Electronically Signed: Pasty Spillers, PA 03/27/2021, 2:39 PM

## 2021-03-27 NOTE — H&P (Deleted)
  The note originally documented on this encounter has been moved the the encounter in which it belongs.  

## 2021-03-27 NOTE — Progress Notes (Signed)
Brief progress note:  Informed of return of CP at approximately 3:15pm.  Pt was placed on tele box, CXR and troponin levels ordered, and hydralazine was dosed once again.  Additionally, the Hospitalist service was consulted for management during this observation and has agreed to evaluate him.    Electronically Signed: Pasty Spillers, PA 03/27/2021, 4:21 PM

## 2021-03-27 NOTE — Consult Note (Addendum)
[facility] Medical Consultation  Franklin Rivera ZOX:[id number] DOB: 1951-01-27 DOA: 03/27/2021 PCP: [name], MD   Requesting physician: Dr Kathlene Cote  Date of consultation: 03/27/2021   Reason for consultation: Chest discomfort and elevated blood pressure.  Impression/Recommendations Principal Problem:   Hepatocellular carcinoma (HCC)   Liver mass, history of hepatitis C with hepatocellular cancer.  IR following for chemoembolization.  INR 1.0.  LFTs within normal limits.  Patient has seen Dr. Irene Limbo oncology as outpatient.  Patient had MRI of the abdomen and pelvis on 01/11/2021 which showed capsular waist 5.8 x 4.8 x 5.2 cm lesion in the liver highly suspicious for hepatocellular cancer.  Chest discomfort mostly in the right lower chest.  No significant cardiac history.  We will trend troponins.  EKG normal sinus rhythm.  Likely postprocedural.  We will get echocardiogram to assess for regional wall motion abnormality.  Incidental COVID 19 positive for patient is not hypoxic.  No respiratory symptoms.  We will check biomarkers.  Not on oxygen at home.  Was put on oxygen post chemoembolization.  Patient is afebrile.  No leukocytosis.  Would not start any antiretrovirals at this time.  Mild hyponatremia.  We will closely monitor.  Elevated blood pressure.  No previous history of hypertension.  Could be secondary to pain and anxiety.  We will put the patient on as needed hydralazine.  Spoke with the patient's family at bedside  [facility] team will followup again tomorrow. Please contact me if I can be of assistance in the meanwhile. Thank you for this consultation.  Chief Complaint: Chest discomfort  HPI:  Patient is a 70 years old male with past medical history of hyperlipidemia and hepatocellular cancer presented to the interventional radiology unit for chemoembolization.  Almost 30 minutes after procedure, patient stated that he was having chest discomfort mostly within the  right lower chest and his blood pressure was elevated.  Medical team was consulted for elevated blood pressure and chest discomfort.  Troponins and EKG were sent from the radiology unit.  Patient incidentally tested positive for COVID as well.  Patient denies any shortness of breath, cough, fever or chills.  He complains of generalized fatigue and weakness.  Patient denies any headache, dizziness, blurred vision, palpitation, abdominal pain or nausea or vomiting.  Patient denies any urinary urgency, frequency or dysuria.  Review of Systems:  All systems were reviewed and were negative except as in the history of presenting illness  Past Medical History:  Diagnosis Date   Allergy    Headache    Hyperlipidemia    Past Surgical History:  Procedure Laterality Date   BLADDER SURGERY     IR 3D INDEPENDENT WKST  03/27/2021   IR ANGIOGRAM SELECTIVE EACH ADDITIONAL VESSEL  03/27/2021   IR ANGIOGRAM SELECTIVE EACH ADDITIONAL VESSEL  03/27/2021   IR ANGIOGRAM SELECTIVE EACH ADDITIONAL VESSEL  03/27/2021   IR ANGIOGRAM SELECTIVE EACH ADDITIONAL VESSEL  03/27/2021   IR ANGIOGRAM SELECTIVE EACH ADDITIONAL VESSEL  03/27/2021   IR ANGIOGRAM SELECTIVE EACH ADDITIONAL VESSEL  03/27/2021   IR ANGIOGRAM VISCERAL SELECTIVE  03/27/2021   IR EMBO TUMOR ORGAN ISCHEMIA INFARCT INC GUIDE ROADMAPPING  03/27/2021   IR RADIOLOGIST EVAL & MGMT  03/07/2021   IR US GUIDANCE  03/27/2021   NASAL FRACTURE SURGERY     Skull Surgery     Social History:  reports that he has never smoked. He has never used smokeless tobacco. He reports that he does not currently use drugs. He reports  that he does not drink alcohol.  No Known Allergies Family History  Problem Relation Age of Onset   Cancer Neg Hx     Prior to Admission medications   Medication Sig Start Date End Date Taking? Authorizing Provider  cetirizine (ZYRTEC) 10 MG tablet Take 1 tablet (10 mg total) by mouth daily. 01/08/21  Yes [name], MD  hydrOXYzine  (ATARAX/VISTARIL) 50 MG tablet Take 1 tablet (50 mg total) by mouth at bedtime as needed (sleep). 01/08/21  Yes [name], MD  tadalafil (CIALIS) 10 MG tablet Take 1 tablet (10 mg total) by mouth daily as needed for erectile dysfunction. 05/25/20   [name], MD    Physical Exam: Blood pressure (!) 157/93, pulse 95, temperature 99 F (37.2 C), temperature source Oral, resp. rate 18, height 6' (1.829 m), weight 88.5 kg, SpO2 100 %. Vitals:   03/27/21 1355 03/27/21 1400  BP: (!) 153/91 (!) 157/93  Pulse: 93 95  Resp:    Temp:    SpO2: 100% 100%   Body mass index is 26.45 kg/m.  No intake or output data in the 24 hours ending 03/27/21 1618   General:  Average built, not in obvious distress HENT: Normocephalic, pupils equally reacting to light and accommodation.  No scleral pallor or icterus noted. Oral mucosa is moist.  Chest:  Clear breath sounds.  Diminished breath sounds bilaterally. No crackles or wheezes.  CVS: S1 &S2 heard. No murmur.  Regular rate and rhythm. Abdomen: Soft, nontender, nondistended.  Bowel sounds are heard.  Mild discomfort over the right upper quadrant. Extremities: No cyanosis, clubbing or edema.  Peripheral pulses are palpable. Psych: Alert, awake and oriented, normal mood CNS:  No cranial nerve deficits.  Power equal in all extremities.  No sensory deficits noted.  No cerebellar signs.   Skin: Warm and dry.  No rashes noted.  Laboratory Data:  CBC: Recent Labs  Lab 03/27/21 0720  WBC 4.8  HGB 14.0  HCT 42.6  MCV 86.6  PLT 710    Basic Metabolic Panel: Recent Labs  Lab 03/27/21 0720  NA 131*  K 4.2  CL 101  CO2 26  GLUCOSE 105*  BUN 20  CREATININE 0.69  CALCIUM 9.0    Liver Function Tests: Recent Labs  Lab 03/27/21 0720  AST 41  ALT 61*  ALKPHOS 81  BILITOT 0.6  PROT 8.6*  ALBUMIN 4.1   No results for input(s): LIPASE, AMYLASE in the last 168 hours. No results for input(s): AMMONIA in the last 168 hours.  Cardiac  Enzymes: No results for input(s): CKTOTAL, CKMB, CKMBINDEX, TROPONINI in the last 168 hours.  BNP: Invalid input(s): POCBNP  CBG: No results for input(s): GLUCAP in the last 168 hours.  Radiological Exams on Admission: IR Angiogram Visceral Selective  Result Date: 03/27/2021 INDICATION: Hepatocellular carcinoma of the right lobe of the liver. The patient is presenting for transcatheter chemoembolization with drug-eluting beads. EXAM: 1. ULTRASOUND GUIDANCE FOR VASCULAR ACCESS OF THE RIGHT COMMON FEMORAL ARTERY 2. SELECTIVE VISCERAL ARTERIOGRAPHY AT THE LEVEL OF THE COMMON HEPATIC ARTERY 3. THREE-DIMENSIONAL RECONSTRUCTION OF ROTATIONAL COMMON HEPATIC ARTERIOGRAPHY 4. ADDITIONAL SELECTIVE RIGHT HEPATIC ARTERIOGRAPHY 5. ADDITIONAL SELECTIVE ARTERIOGRAPHY OF SUPERIOR RIGHT HEPATIC ARTERY BRANCH 6. ADDITIONAL SELECTIVE ARTERIOGRAPHY OF ADDITIONAL SUPERIOR RIGHT HEPATIC ARTERY BRANCH 7. ADDITIONAL SELECTIVE ARTERIOGRAPHY OF MID RIGHT HEPATIC ARTERIAL BRANCH 8. ADDITIONAL SELECTIVE ARTERIOGRAPHY OF MID RIGHT HEPATIC ARTERIAL BRANCH 9. ADDITIONAL SELECTIVE ARTERIOGRAPHY OF INFERIOR RIGHT HEPATIC ARTERIAL BRANCH 10. TRANSCATHETER EMBOLIZATION OF RIGHT  LOBE HEPATOCELLULAR CARCINOMA MEDICATIONS: 3.375 g IV Zosyn, 8 mg IV Decadron, 4 mg IV Zofran and 20 mg IV hydralazine ANESTHESIA/SEDATION: Moderate (conscious) sedation was employed during this procedure. A total of Versed 6.0 mg and Fentanyl 300 mcg was administered intravenously by the radiology nurse. Total intra-service moderate Sedation Time: 175 minutes. The patient's level of consciousness and vital signs were monitored continuously by radiology nursing throughout the procedure under my direct supervision. CONTRAST:  171 mL Omnipaque 300 FLUOROSCOPY TIME:  Fluoroscopy Time: 58 minutes and 42 seconds. 2750 mGy. COMPLICATIONS: None immediate. PROCEDURE: Informed consent was obtained from the patient following explanation of the procedure, risks, benefits and  alternatives. The patient understands, agrees and consents for the procedure. All questions were addressed. A time out was performed prior to the initiation of the procedure. Maximal barrier sterile technique utilized including caps, mask, sterile gowns, sterile gloves, large sterile drape, hand hygiene, and chlorhexidine prep. Local anesthesia was provided with 1% lidocaine. The day prior to the procedure, drug-eluting beads were prepared by the Pharmacy with 2 separate vials 100-300 micron sized LC Beads, each loaded with 75 mg of doxorubicin. Ultrasound was used to confirm patency of the right common femoral artery. A permanent ultrasound image was recorded. The right common femoral artery was accessed utilizing a 21 gauge needle micropuncture set. After access, a 5 French sheath was placed. A 5 French Cobra catheter was advanced into the abdominal aorta and used to selectively catheterize the celiac axis. The catheter was further advanced over a guidewire into the common hepatic artery and selective arteriography performed at the level of the common hepatic artery. The catheter was advanced just beyond the gastroduodenal artery into the proper hepatic artery and rotational arteriography performed centered over the liver. Three-dimensional processing of rotation arteriography was performed on a workstation and used for 3 dimensional mapping and additional selective arteriography within the right lobe of the liver. The 5 French catheter was then used to selectively catheterize the right hepatic artery. Selective arteriography was performed of the right hepatic artery. A microcatheter was then advanced through the 5 French catheter and used to selectively catheterize a superior right hepatic artery branch. Selective arteriography was performed. The catheter was then used to further selectively catheterize an additional superior right hepatic arterial branch and selective arteriography performed. Additional  selective arteriography was then performed at the level of the initial catheterized superior right hepatic artery branch and selective arteriography performed. Chemoembolization was performed at the level of the superior right hepatic artery branch with installation of 1 vial of doxorubicin loaded beads. The beads were diluted in contrast material and slowly instilled under fluoroscopic guidance. Additional arteriography was performed to assess flow. The microcatheter was retracted then used to selectively catheterize an additional right hepatic artery branch within the midportion of the right lobe of the liver. Selective arteriography was performed. A second mid right hepatic arterial branch was also selectively catheterized and selective arteriography performed. Chemoembolization was then performed at the level of this branch with installation of part of a vial of loaded beads. Additional arteriography was performed. The microcatheter was removed and additional arteriography performed through the 5 French catheter at the level of the right hepatic artery in order to assess residual blood supply to the tumor. The microcatheter was then reintroduced via the 5 French catheter and used to selectively catheterize an additional inferior right hepatic artery branch. Selective arteriography was performed through the microcatheter. Additional chemoembolization was then performed at the level the inferior right  hepatic artery branch via the microcatheter with installation of drug-eluting beads. Additional arteriography was performed. Catheters were removed. Contrast injection was performed in an oblique projection at the level of the right groin in order to assess level of puncture of the right common femoral artery. Arteriotomy closure was then performed with the Cordis ExoSeal device. FINDINGS: Initial arteriography demonstrates numerous irregular arterial branches supplying hypervascular tumor within the right lobe of  the liver extending throughout much of the right lobe and having an oval shape. Reconstructed 3D imaging of arterial blood supply from rotational arteriography demonstrates clear enlargement of the mass since prior imaging with estimated dimensions of approximately 11 x 8.5 x 10 cm compared to approximately 8.2 x 6.2 x 8 cm by CT in November. There was ability to initially characterize and catheterize superior blood supply to the large tumor via a branch of the right hepatic artery. Selective arteriography demonstrates tumor blush along the superior aspect of the mass. During treatment of this branch with installation of drug-eluting beads there was also reflux of contrast into another branch which was also clearly supplying tumor resulting in further staining of the tumor with treatment. There appear to be approximately 1/3 of the tumor volume able to be treated with the initial installation of particles. This distribution did accommodate essentially 1 vial of the drug-eluting beads. Additional catheterization was then performed at the level of a mid right hepatic arterial branch with a descending course that is clearly supplying a portion of the central aspect of the tumor. This supply was able to be treated effectively with a portion of a vial of the drug-eluting particles. Additional catheterization was then performed of a medial descending right hepatic arterial branch that supplies the inferior aspect of the tumor. This branch was able to be effectively treated with installation of part of a vial of drug-eluting particles. After completion of treatment, there was approximately 1/4 or slightly less volume of a vial of particles left over which were not administered. IMPRESSION: Transcatheter chemoembolization of large right lobe hepatocellular carcinoma as described in detail above which included selective catheterization and transcatheter embolization within 3 vascular territories supplying the tumor.  Drug-eluting LC beads loaded with doxorubicin were utilized for the treatment with nearly all of 2 vials loaded with a total of 150 mg of doxorubicin utilized. No was made on the 3 dimensional reconstruction of rotational arteriography of significant enlargement of the tumor since prior CT in November with maximum height of 11 cm today compared to approximately 8 cm previously. The patient will be observed overnight to treat any post embolic symptoms. Electronically Signed   By: Aletta Edouard M.D.   On: 03/27/2021 16:00   IR Angiogram Selective Each Additional Vessel  Result Date: 03/27/2021 INDICATION: Hepatocellular carcinoma of the right lobe of the liver. The patient is presenting for transcatheter chemoembolization with drug-eluting beads. EXAM: 1. ULTRASOUND GUIDANCE FOR VASCULAR ACCESS OF THE RIGHT COMMON FEMORAL ARTERY 2. SELECTIVE VISCERAL ARTERIOGRAPHY AT THE LEVEL OF THE COMMON HEPATIC ARTERY 3. THREE-DIMENSIONAL RECONSTRUCTION OF ROTATIONAL COMMON HEPATIC ARTERIOGRAPHY 4. ADDITIONAL SELECTIVE RIGHT HEPATIC ARTERIOGRAPHY 5. ADDITIONAL SELECTIVE ARTERIOGRAPHY OF SUPERIOR RIGHT HEPATIC ARTERY BRANCH 6. ADDITIONAL SELECTIVE ARTERIOGRAPHY OF ADDITIONAL SUPERIOR RIGHT HEPATIC ARTERY BRANCH 7. ADDITIONAL SELECTIVE ARTERIOGRAPHY OF MID RIGHT HEPATIC ARTERIAL BRANCH 8. ADDITIONAL SELECTIVE ARTERIOGRAPHY OF MID RIGHT HEPATIC ARTERIAL BRANCH 9. ADDITIONAL SELECTIVE ARTERIOGRAPHY OF INFERIOR RIGHT HEPATIC ARTERIAL BRANCH 10. TRANSCATHETER EMBOLIZATION OF RIGHT LOBE HEPATOCELLULAR CARCINOMA MEDICATIONS: 3.375 g IV Zosyn, 8 mg IV Decadron, 4 mg  IV Zofran and 20 mg IV hydralazine ANESTHESIA/SEDATION: Moderate (conscious) sedation was employed during this procedure. A total of Versed 6.0 mg and Fentanyl 300 mcg was administered intravenously by the radiology nurse. Total intra-service moderate Sedation Time: 175 minutes. The patient's level of consciousness and vital signs were monitored continuously by radiology  nursing throughout the procedure under my direct supervision. CONTRAST:  171 mL Omnipaque 300 FLUOROSCOPY TIME:  Fluoroscopy Time: 58 minutes and 42 seconds. 2750 mGy. COMPLICATIONS: None immediate. PROCEDURE: Informed consent was obtained from the patient following explanation of the procedure, risks, benefits and alternatives. The patient understands, agrees and consents for the procedure. All questions were addressed. A time out was performed prior to the initiation of the procedure. Maximal barrier sterile technique utilized including caps, mask, sterile gowns, sterile gloves, large sterile drape, hand hygiene, and chlorhexidine prep. Local anesthesia was provided with 1% lidocaine. The day prior to the procedure, drug-eluting beads were prepared by the Pharmacy with 2 separate vials 100-300 micron sized LC Beads, each loaded with 75 mg of doxorubicin. Ultrasound was used to confirm patency of the right common femoral artery. A permanent ultrasound image was recorded. The right common femoral artery was accessed utilizing a 21 gauge needle micropuncture set. After access, a 5 French sheath was placed. A 5 French Cobra catheter was advanced into the abdominal aorta and used to selectively catheterize the celiac axis. The catheter was further advanced over a guidewire into the common hepatic artery and selective arteriography performed at the level of the common hepatic artery. The catheter was advanced just beyond the gastroduodenal artery into the proper hepatic artery and rotational arteriography performed centered over the liver. Three-dimensional processing of rotation arteriography was performed on a workstation and used for 3 dimensional mapping and additional selective arteriography within the right lobe of the liver. The 5 French catheter was then used to selectively catheterize the right hepatic artery. Selective arteriography was performed of the right hepatic artery. A microcatheter was then advanced  through the 5 French catheter and used to selectively catheterize a superior right hepatic artery branch. Selective arteriography was performed. The catheter was then used to further selectively catheterize an additional superior right hepatic arterial branch and selective arteriography performed. Additional selective arteriography was then performed at the level of the initial catheterized superior right hepatic artery branch and selective arteriography performed. Chemoembolization was performed at the level of the superior right hepatic artery branch with installation of 1 vial of doxorubicin loaded beads. The beads were diluted in contrast material and slowly instilled under fluoroscopic guidance. Additional arteriography was performed to assess flow. The microcatheter was retracted then used to selectively catheterize an additional right hepatic artery branch within the midportion of the right lobe of the liver. Selective arteriography was performed. A second mid right hepatic arterial branch was also selectively catheterized and selective arteriography performed. Chemoembolization was then performed at the level of this branch with installation of part of a vial of loaded beads. Additional arteriography was performed. The microcatheter was removed and additional arteriography performed through the 5 French catheter at the level of the right hepatic artery in order to assess residual blood supply to the tumor. The microcatheter was then reintroduced via the 5 French catheter and used to selectively catheterize an additional inferior right hepatic artery branch. Selective arteriography was performed through the microcatheter. Additional chemoembolization was then performed at the level the inferior right hepatic artery branch via the microcatheter with installation of drug-eluting beads. Additional arteriography was  performed. Catheters were removed. Contrast injection was performed in an oblique projection at  the level of the right groin in order to assess level of puncture of the right common femoral artery. Arteriotomy closure was then performed with the Cordis ExoSeal device. FINDINGS: Initial arteriography demonstrates numerous irregular arterial branches supplying hypervascular tumor within the right lobe of the liver extending throughout much of the right lobe and having an oval shape. Reconstructed 3D imaging of arterial blood supply from rotational arteriography demonstrates clear enlargement of the mass since prior imaging with estimated dimensions of approximately 11 x 8.5 x 10 cm compared to approximately 8.2 x 6.2 x 8 cm by CT in November. There was ability to initially characterize and catheterize superior blood supply to the large tumor via a branch of the right hepatic artery. Selective arteriography demonstrates tumor blush along the superior aspect of the mass. During treatment of this branch with installation of drug-eluting beads there was also reflux of contrast into another branch which was also clearly supplying tumor resulting in further staining of the tumor with treatment. There appear to be approximately 1/3 of the tumor volume able to be treated with the initial installation of particles. This distribution did accommodate essentially 1 vial of the drug-eluting beads. Additional catheterization was then performed at the level of a mid right hepatic arterial branch with a descending course that is clearly supplying a portion of the central aspect of the tumor. This supply was able to be treated effectively with a portion of a vial of the drug-eluting particles. Additional catheterization was then performed of a medial descending right hepatic arterial branch that supplies the inferior aspect of the tumor. This branch was able to be effectively treated with installation of part of a vial of drug-eluting particles. After completion of treatment, there was approximately 1/4 or slightly less volume  of a vial of particles left over which were not administered. IMPRESSION: Transcatheter chemoembolization of large right lobe hepatocellular carcinoma as described in detail above which included selective catheterization and transcatheter embolization within 3 vascular territories supplying the tumor. Drug-eluting LC beads loaded with doxorubicin were utilized for the treatment with nearly all of 2 vials loaded with a total of 150 mg of doxorubicin utilized. No was made on the 3 dimensional reconstruction of rotational arteriography of significant enlargement of the tumor since prior CT in November with maximum height of 11 cm today compared to approximately 8 cm previously. The patient will be observed overnight to treat any post embolic symptoms. Electronically Signed   By: Aletta Edouard M.D.   On: 03/27/2021 16:00   IR Angiogram Selective Each Additional Vessel  Result Date: 03/27/2021 INDICATION: Hepatocellular carcinoma of the right lobe of the liver. The patient is presenting for transcatheter chemoembolization with drug-eluting beads. EXAM: 1. ULTRASOUND GUIDANCE FOR VASCULAR ACCESS OF THE RIGHT COMMON FEMORAL ARTERY 2. SELECTIVE VISCERAL ARTERIOGRAPHY AT THE LEVEL OF THE COMMON HEPATIC ARTERY 3. THREE-DIMENSIONAL RECONSTRUCTION OF ROTATIONAL COMMON HEPATIC ARTERIOGRAPHY 4. ADDITIONAL SELECTIVE RIGHT HEPATIC ARTERIOGRAPHY 5. ADDITIONAL SELECTIVE ARTERIOGRAPHY OF SUPERIOR RIGHT HEPATIC ARTERY BRANCH 6. ADDITIONAL SELECTIVE ARTERIOGRAPHY OF ADDITIONAL SUPERIOR RIGHT HEPATIC ARTERY BRANCH 7. ADDITIONAL SELECTIVE ARTERIOGRAPHY OF MID RIGHT HEPATIC ARTERIAL BRANCH 8. ADDITIONAL SELECTIVE ARTERIOGRAPHY OF MID RIGHT HEPATIC ARTERIAL BRANCH 9. ADDITIONAL SELECTIVE ARTERIOGRAPHY OF INFERIOR RIGHT HEPATIC ARTERIAL BRANCH 10. TRANSCATHETER EMBOLIZATION OF RIGHT LOBE HEPATOCELLULAR CARCINOMA MEDICATIONS: 3.375 g IV Zosyn, 8 mg IV Decadron, 4 mg IV Zofran and 20 mg IV hydralazine ANESTHESIA/SEDATION: Moderate  (conscious) sedation was employed  during this procedure. A total of Versed 6.0 mg and Fentanyl 300 mcg was administered intravenously by the radiology nurse. Total intra-service moderate Sedation Time: 175 minutes. The patient's level of consciousness and vital signs were monitored continuously by radiology nursing throughout the procedure under my direct supervision. CONTRAST:  171 mL Omnipaque 300 FLUOROSCOPY TIME:  Fluoroscopy Time: 58 minutes and 42 seconds. 2750 mGy. COMPLICATIONS: None immediate. PROCEDURE: Informed consent was obtained from the patient following explanation of the procedure, risks, benefits and alternatives. The patient understands, agrees and consents for the procedure. All questions were addressed. A time out was performed prior to the initiation of the procedure. Maximal barrier sterile technique utilized including caps, mask, sterile gowns, sterile gloves, large sterile drape, hand hygiene, and chlorhexidine prep. Local anesthesia was provided with 1% lidocaine. The day prior to the procedure, drug-eluting beads were prepared by the Pharmacy with 2 separate vials 100-300 micron sized LC Beads, each loaded with 75 mg of doxorubicin. Ultrasound was used to confirm patency of the right common femoral artery. A permanent ultrasound image was recorded. The right common femoral artery was accessed utilizing a 21 gauge needle micropuncture set. After access, a 5 French sheath was placed. A 5 French Cobra catheter was advanced into the abdominal aorta and used to selectively catheterize the celiac axis. The catheter was further advanced over a guidewire into the common hepatic artery and selective arteriography performed at the level of the common hepatic artery. The catheter was advanced just beyond the gastroduodenal artery into the proper hepatic artery and rotational arteriography performed centered over the liver. Three-dimensional processing of rotation arteriography was performed on a  workstation and used for 3 dimensional mapping and additional selective arteriography within the right lobe of the liver. The 5 French catheter was then used to selectively catheterize the right hepatic artery. Selective arteriography was performed of the right hepatic artery. A microcatheter was then advanced through the 5 French catheter and used to selectively catheterize a superior right hepatic artery branch. Selective arteriography was performed. The catheter was then used to further selectively catheterize an additional superior right hepatic arterial branch and selective arteriography performed. Additional selective arteriography was then performed at the level of the initial catheterized superior right hepatic artery branch and selective arteriography performed. Chemoembolization was performed at the level of the superior right hepatic artery branch with installation of 1 vial of doxorubicin loaded beads. The beads were diluted in contrast material and slowly instilled under fluoroscopic guidance. Additional arteriography was performed to assess flow. The microcatheter was retracted then used to selectively catheterize an additional right hepatic artery branch within the midportion of the right lobe of the liver. Selective arteriography was performed. A second mid right hepatic arterial branch was also selectively catheterized and selective arteriography performed. Chemoembolization was then performed at the level of this branch with installation of part of a vial of loaded beads. Additional arteriography was performed. The microcatheter was removed and additional arteriography performed through the 5 French catheter at the level of the right hepatic artery in order to assess residual blood supply to the tumor. The microcatheter was then reintroduced via the 5 French catheter and used to selectively catheterize an additional inferior right hepatic artery branch. Selective arteriography was performed through  the microcatheter. Additional chemoembolization was then performed at the level the inferior right hepatic artery branch via the microcatheter with installation of drug-eluting beads. Additional arteriography was performed. Catheters were removed. Contrast injection was performed in an oblique projection at  the level of the right groin in order to assess level of puncture of the right common femoral artery. Arteriotomy closure was then performed with the Cordis ExoSeal device. FINDINGS: Initial arteriography demonstrates numerous irregular arterial branches supplying hypervascular tumor within the right lobe of the liver extending throughout much of the right lobe and having an oval shape. Reconstructed 3D imaging of arterial blood supply from rotational arteriography demonstrates clear enlargement of the mass since prior imaging with estimated dimensions of approximately 11 x 8.5 x 10 cm compared to approximately 8.2 x 6.2 x 8 cm by CT in November. There was ability to initially characterize and catheterize superior blood supply to the large tumor via a branch of the right hepatic artery. Selective arteriography demonstrates tumor blush along the superior aspect of the mass. During treatment of this branch with installation of drug-eluting beads there was also reflux of contrast into another branch which was also clearly supplying tumor resulting in further staining of the tumor with treatment. There appear to be approximately 1/3 of the tumor volume able to be treated with the initial installation of particles. This distribution did accommodate essentially 1 vial of the drug-eluting beads. Additional catheterization was then performed at the level of a mid right hepatic arterial branch with a descending course that is clearly supplying a portion of the central aspect of the tumor. This supply was able to be treated effectively with a portion of a vial of the drug-eluting particles. Additional catheterization was  then performed of a medial descending right hepatic arterial branch that supplies the inferior aspect of the tumor. This branch was able to be effectively treated with installation of part of a vial of drug-eluting particles. After completion of treatment, there was approximately 1/4 or slightly less volume of a vial of particles left over which were not administered. IMPRESSION: Transcatheter chemoembolization of large right lobe hepatocellular carcinoma as described in detail above which included selective catheterization and transcatheter embolization within 3 vascular territories supplying the tumor. Drug-eluting LC beads loaded with doxorubicin were utilized for the treatment with nearly all of 2 vials loaded with a total of 150 mg of doxorubicin utilized. No was made on the 3 dimensional reconstruction of rotational arteriography of significant enlargement of the tumor since prior CT in November with maximum height of 11 cm today compared to approximately 8 cm previously. The patient will be observed overnight to treat any post embolic symptoms. Electronically Signed   By: Aletta Edouard M.D.   On: 03/27/2021 16:00   IR Angiogram Selective Each Additional Vessel  Result Date: 03/27/2021 INDICATION: Hepatocellular carcinoma of the right lobe of the liver. The patient is presenting for transcatheter chemoembolization with drug-eluting beads. EXAM: 1. ULTRASOUND GUIDANCE FOR VASCULAR ACCESS OF THE RIGHT COMMON FEMORAL ARTERY 2. SELECTIVE VISCERAL ARTERIOGRAPHY AT THE LEVEL OF THE COMMON HEPATIC ARTERY 3. THREE-DIMENSIONAL RECONSTRUCTION OF ROTATIONAL COMMON HEPATIC ARTERIOGRAPHY 4. ADDITIONAL SELECTIVE RIGHT HEPATIC ARTERIOGRAPHY 5. ADDITIONAL SELECTIVE ARTERIOGRAPHY OF SUPERIOR RIGHT HEPATIC ARTERY BRANCH 6. ADDITIONAL SELECTIVE ARTERIOGRAPHY OF ADDITIONAL SUPERIOR RIGHT HEPATIC ARTERY BRANCH 7. ADDITIONAL SELECTIVE ARTERIOGRAPHY OF MID RIGHT HEPATIC ARTERIAL BRANCH 8. ADDITIONAL SELECTIVE ARTERIOGRAPHY OF  MID RIGHT HEPATIC ARTERIAL BRANCH 9. ADDITIONAL SELECTIVE ARTERIOGRAPHY OF INFERIOR RIGHT HEPATIC ARTERIAL BRANCH 10. TRANSCATHETER EMBOLIZATION OF RIGHT LOBE HEPATOCELLULAR CARCINOMA MEDICATIONS: 3.375 g IV Zosyn, 8 mg IV Decadron, 4 mg IV Zofran and 20 mg IV hydralazine ANESTHESIA/SEDATION: Moderate (conscious) sedation was employed during this procedure. A total of Versed 6.0 mg and Fentanyl 300 mcg was  administered intravenously by the radiology nurse. Total intra-service moderate Sedation Time: 175 minutes. The patient's level of consciousness and vital signs were monitored continuously by radiology nursing throughout the procedure under my direct supervision. CONTRAST:  171 mL Omnipaque 300 FLUOROSCOPY TIME:  Fluoroscopy Time: 58 minutes and 42 seconds. 2750 mGy. COMPLICATIONS: None immediate. PROCEDURE: Informed consent was obtained from the patient following explanation of the procedure, risks, benefits and alternatives. The patient understands, agrees and consents for the procedure. All questions were addressed. A time out was performed prior to the initiation of the procedure. Maximal barrier sterile technique utilized including caps, mask, sterile gowns, sterile gloves, large sterile drape, hand hygiene, and chlorhexidine prep. Local anesthesia was provided with 1% lidocaine. The day prior to the procedure, drug-eluting beads were prepared by the Pharmacy with 2 separate vials 100-300 micron sized LC Beads, each loaded with 75 mg of doxorubicin. Ultrasound was used to confirm patency of the right common femoral artery. A permanent ultrasound image was recorded. The right common femoral artery was accessed utilizing a 21 gauge needle micropuncture set. After access, a 5 French sheath was placed. A 5 French Cobra catheter was advanced into the abdominal aorta and used to selectively catheterize the celiac axis. The catheter was further advanced over a guidewire into the common hepatic artery and selective  arteriography performed at the level of the common hepatic artery. The catheter was advanced just beyond the gastroduodenal artery into the proper hepatic artery and rotational arteriography performed centered over the liver. Three-dimensional processing of rotation arteriography was performed on a workstation and used for 3 dimensional mapping and additional selective arteriography within the right lobe of the liver. The 5 French catheter was then used to selectively catheterize the right hepatic artery. Selective arteriography was performed of the right hepatic artery. A microcatheter was then advanced through the 5 French catheter and used to selectively catheterize a superior right hepatic artery branch. Selective arteriography was performed. The catheter was then used to further selectively catheterize an additional superior right hepatic arterial branch and selective arteriography performed. Additional selective arteriography was then performed at the level of the initial catheterized superior right hepatic artery branch and selective arteriography performed. Chemoembolization was performed at the level of the superior right hepatic artery branch with installation of 1 vial of doxorubicin loaded beads. The beads were diluted in contrast material and slowly instilled under fluoroscopic guidance. Additional arteriography was performed to assess flow. The microcatheter was retracted then used to selectively catheterize an additional right hepatic artery branch within the midportion of the right lobe of the liver. Selective arteriography was performed. A second mid right hepatic arterial branch was also selectively catheterized and selective arteriography performed. Chemoembolization was then performed at the level of this branch with installation of part of a vial of loaded beads. Additional arteriography was performed. The microcatheter was removed and additional arteriography performed through the 5 French  catheter at the level of the right hepatic artery in order to assess residual blood supply to the tumor. The microcatheter was then reintroduced via the 5 French catheter and used to selectively catheterize an additional inferior right hepatic artery branch. Selective arteriography was performed through the microcatheter. Additional chemoembolization was then performed at the level the inferior right hepatic artery branch via the microcatheter with installation of drug-eluting beads. Additional arteriography was performed. Catheters were removed. Contrast injection was performed in an oblique projection at the level of the right groin in order to assess level of puncture of  the right common femoral artery. Arteriotomy closure was then performed with the Cordis ExoSeal device. FINDINGS: Initial arteriography demonstrates numerous irregular arterial branches supplying hypervascular tumor within the right lobe of the liver extending throughout much of the right lobe and having an oval shape. Reconstructed 3D imaging of arterial blood supply from rotational arteriography demonstrates clear enlargement of the mass since prior imaging with estimated dimensions of approximately 11 x 8.5 x 10 cm compared to approximately 8.2 x 6.2 x 8 cm by CT in November. There was ability to initially characterize and catheterize superior blood supply to the large tumor via a branch of the right hepatic artery. Selective arteriography demonstrates tumor blush along the superior aspect of the mass. During treatment of this branch with installation of drug-eluting beads there was also reflux of contrast into another branch which was also clearly supplying tumor resulting in further staining of the tumor with treatment. There appear to be approximately 1/3 of the tumor volume able to be treated with the initial installation of particles. This distribution did accommodate essentially 1 vial of the drug-eluting beads. Additional  catheterization was then performed at the level of a mid right hepatic arterial branch with a descending course that is clearly supplying a portion of the central aspect of the tumor. This supply was able to be treated effectively with a portion of a vial of the drug-eluting particles. Additional catheterization was then performed of a medial descending right hepatic arterial branch that supplies the inferior aspect of the tumor. This branch was able to be effectively treated with installation of part of a vial of drug-eluting particles. After completion of treatment, there was approximately 1/4 or slightly less volume of a vial of particles left over which were not administered. IMPRESSION: Transcatheter chemoembolization of large right lobe hepatocellular carcinoma as described in detail above which included selective catheterization and transcatheter embolization within 3 vascular territories supplying the tumor. Drug-eluting LC beads loaded with doxorubicin were utilized for the treatment with nearly all of 2 vials loaded with a total of 150 mg of doxorubicin utilized. No was made on the 3 dimensional reconstruction of rotational arteriography of significant enlargement of the tumor since prior CT in November with maximum height of 11 cm today compared to approximately 8 cm previously. The patient will be observed overnight to treat any post embolic symptoms. Electronically Signed   By: Aletta Edouard M.D.   On: 03/27/2021 16:00   IR Angiogram Selective Each Additional Vessel  Result Date: 03/27/2021 INDICATION: Hepatocellular carcinoma of the right lobe of the liver. The patient is presenting for transcatheter chemoembolization with drug-eluting beads. EXAM: 1. ULTRASOUND GUIDANCE FOR VASCULAR ACCESS OF THE RIGHT COMMON FEMORAL ARTERY 2. SELECTIVE VISCERAL ARTERIOGRAPHY AT THE LEVEL OF THE COMMON HEPATIC ARTERY 3. THREE-DIMENSIONAL RECONSTRUCTION OF ROTATIONAL COMMON HEPATIC ARTERIOGRAPHY 4. ADDITIONAL  SELECTIVE RIGHT HEPATIC ARTERIOGRAPHY 5. ADDITIONAL SELECTIVE ARTERIOGRAPHY OF SUPERIOR RIGHT HEPATIC ARTERY BRANCH 6. ADDITIONAL SELECTIVE ARTERIOGRAPHY OF ADDITIONAL SUPERIOR RIGHT HEPATIC ARTERY BRANCH 7. ADDITIONAL SELECTIVE ARTERIOGRAPHY OF MID RIGHT HEPATIC ARTERIAL BRANCH 8. ADDITIONAL SELECTIVE ARTERIOGRAPHY OF MID RIGHT HEPATIC ARTERIAL BRANCH 9. ADDITIONAL SELECTIVE ARTERIOGRAPHY OF INFERIOR RIGHT HEPATIC ARTERIAL BRANCH 10. TRANSCATHETER EMBOLIZATION OF RIGHT LOBE HEPATOCELLULAR CARCINOMA MEDICATIONS: 3.375 g IV Zosyn, 8 mg IV Decadron, 4 mg IV Zofran and 20 mg IV hydralazine ANESTHESIA/SEDATION: Moderate (conscious) sedation was employed during this procedure. A total of Versed 6.0 mg and Fentanyl 300 mcg was administered intravenously by the radiology nurse. Total intra-service moderate Sedation Time: 175 minutes. The  patient's level of consciousness and vital signs were monitored continuously by radiology nursing throughout the procedure under my direct supervision. CONTRAST:  171 mL Omnipaque 300 FLUOROSCOPY TIME:  Fluoroscopy Time: 58 minutes and 42 seconds. 2750 mGy. COMPLICATIONS: None immediate. PROCEDURE: Informed consent was obtained from the patient following explanation of the procedure, risks, benefits and alternatives. The patient understands, agrees and consents for the procedure. All questions were addressed. A time out was performed prior to the initiation of the procedure. Maximal barrier sterile technique utilized including caps, mask, sterile gowns, sterile gloves, large sterile drape, hand hygiene, and chlorhexidine prep. Local anesthesia was provided with 1% lidocaine. The day prior to the procedure, drug-eluting beads were prepared by the Pharmacy with 2 separate vials 100-300 micron sized LC Beads, each loaded with 75 mg of doxorubicin. Ultrasound was used to confirm patency of the right common femoral artery. A permanent ultrasound image was recorded. The right common femoral  artery was accessed utilizing a 21 gauge needle micropuncture set. After access, a 5 French sheath was placed. A 5 French Cobra catheter was advanced into the abdominal aorta and used to selectively catheterize the celiac axis. The catheter was further advanced over a guidewire into the common hepatic artery and selective arteriography performed at the level of the common hepatic artery. The catheter was advanced just beyond the gastroduodenal artery into the proper hepatic artery and rotational arteriography performed centered over the liver. Three-dimensional processing of rotation arteriography was performed on a workstation and used for 3 dimensional mapping and additional selective arteriography within the right lobe of the liver. The 5 French catheter was then used to selectively catheterize the right hepatic artery. Selective arteriography was performed of the right hepatic artery. A microcatheter was then advanced through the 5 French catheter and used to selectively catheterize a superior right hepatic artery branch. Selective arteriography was performed. The catheter was then used to further selectively catheterize an additional superior right hepatic arterial branch and selective arteriography performed. Additional selective arteriography was then performed at the level of the initial catheterized superior right hepatic artery branch and selective arteriography performed. Chemoembolization was performed at the level of the superior right hepatic artery branch with installation of 1 vial of doxorubicin loaded beads. The beads were diluted in contrast material and slowly instilled under fluoroscopic guidance. Additional arteriography was performed to assess flow. The microcatheter was retracted then used to selectively catheterize an additional right hepatic artery branch within the midportion of the right lobe of the liver. Selective arteriography was performed. A second mid right hepatic arterial branch  was also selectively catheterized and selective arteriography performed. Chemoembolization was then performed at the level of this branch with installation of part of a vial of loaded beads. Additional arteriography was performed. The microcatheter was removed and additional arteriography performed through the 5 French catheter at the level of the right hepatic artery in order to assess residual blood supply to the tumor. The microcatheter was then reintroduced via the 5 French catheter and used to selectively catheterize an additional inferior right hepatic artery branch. Selective arteriography was performed through the microcatheter. Additional chemoembolization was then performed at the level the inferior right hepatic artery branch via the microcatheter with installation of drug-eluting beads. Additional arteriography was performed. Catheters were removed. Contrast injection was performed in an oblique projection at the level of the right groin in order to assess level of puncture of the right common femoral artery. Arteriotomy closure was then performed with the Cordis ExoSeal  device. FINDINGS: Initial arteriography demonstrates numerous irregular arterial branches supplying hypervascular tumor within the right lobe of the liver extending throughout much of the right lobe and having an oval shape. Reconstructed 3D imaging of arterial blood supply from rotational arteriography demonstrates clear enlargement of the mass since prior imaging with estimated dimensions of approximately 11 x 8.5 x 10 cm compared to approximately 8.2 x 6.2 x 8 cm by CT in November. There was ability to initially characterize and catheterize superior blood supply to the large tumor via a branch of the right hepatic artery. Selective arteriography demonstrates tumor blush along the superior aspect of the mass. During treatment of this branch with installation of drug-eluting beads there was also reflux of contrast into another branch  which was also clearly supplying tumor resulting in further staining of the tumor with treatment. There appear to be approximately 1/3 of the tumor volume able to be treated with the initial installation of particles. This distribution did accommodate essentially 1 vial of the drug-eluting beads. Additional catheterization was then performed at the level of a mid right hepatic arterial branch with a descending course that is clearly supplying a portion of the central aspect of the tumor. This supply was able to be treated effectively with a portion of a vial of the drug-eluting particles. Additional catheterization was then performed of a medial descending right hepatic arterial branch that supplies the inferior aspect of the tumor. This branch was able to be effectively treated with installation of part of a vial of drug-eluting particles. After completion of treatment, there was approximately 1/4 or slightly less volume of a vial of particles left over which were not administered. IMPRESSION: Transcatheter chemoembolization of large right lobe hepatocellular carcinoma as described in detail above which included selective catheterization and transcatheter embolization within 3 vascular territories supplying the tumor. Drug-eluting LC beads loaded with doxorubicin were utilized for the treatment with nearly all of 2 vials loaded with a total of 150 mg of doxorubicin utilized. No was made on the 3 dimensional reconstruction of rotational arteriography of significant enlargement of the tumor since prior CT in November with maximum height of 11 cm today compared to approximately 8 cm previously. The patient will be observed overnight to treat any post embolic symptoms. Electronically Signed   By: Aletta Edouard M.D.   On: 03/27/2021 16:00   IR Angiogram Selective Each Additional Vessel  Result Date: 03/27/2021 INDICATION: Hepatocellular carcinoma of the right lobe of the liver. The patient is presenting for  transcatheter chemoembolization with drug-eluting beads. EXAM: 1. ULTRASOUND GUIDANCE FOR VASCULAR ACCESS OF THE RIGHT COMMON FEMORAL ARTERY 2. SELECTIVE VISCERAL ARTERIOGRAPHY AT THE LEVEL OF THE COMMON HEPATIC ARTERY 3. THREE-DIMENSIONAL RECONSTRUCTION OF ROTATIONAL COMMON HEPATIC ARTERIOGRAPHY 4. ADDITIONAL SELECTIVE RIGHT HEPATIC ARTERIOGRAPHY 5. ADDITIONAL SELECTIVE ARTERIOGRAPHY OF SUPERIOR RIGHT HEPATIC ARTERY BRANCH 6. ADDITIONAL SELECTIVE ARTERIOGRAPHY OF ADDITIONAL SUPERIOR RIGHT HEPATIC ARTERY BRANCH 7. ADDITIONAL SELECTIVE ARTERIOGRAPHY OF MID RIGHT HEPATIC ARTERIAL BRANCH 8. ADDITIONAL SELECTIVE ARTERIOGRAPHY OF MID RIGHT HEPATIC ARTERIAL BRANCH 9. ADDITIONAL SELECTIVE ARTERIOGRAPHY OF INFERIOR RIGHT HEPATIC ARTERIAL BRANCH 10. TRANSCATHETER EMBOLIZATION OF RIGHT LOBE HEPATOCELLULAR CARCINOMA MEDICATIONS: 3.375 g IV Zosyn, 8 mg IV Decadron, 4 mg IV Zofran and 20 mg IV hydralazine ANESTHESIA/SEDATION: Moderate (conscious) sedation was employed during this procedure. A total of Versed 6.0 mg and Fentanyl 300 mcg was administered intravenously by the radiology nurse. Total intra-service moderate Sedation Time: 175 minutes. The patient's level of consciousness and vital signs were monitored continuously by radiology nursing throughout  the procedure under my direct supervision. CONTRAST:  171 mL Omnipaque 300 FLUOROSCOPY TIME:  Fluoroscopy Time: 58 minutes and 42 seconds. 2750 mGy. COMPLICATIONS: None immediate. PROCEDURE: Informed consent was obtained from the patient following explanation of the procedure, risks, benefits and alternatives. The patient understands, agrees and consents for the procedure. All questions were addressed. A time out was performed prior to the initiation of the procedure. Maximal barrier sterile technique utilized including caps, mask, sterile gowns, sterile gloves, large sterile drape, hand hygiene, and chlorhexidine prep. Local anesthesia was provided with 1% lidocaine. The day  prior to the procedure, drug-eluting beads were prepared by the Pharmacy with 2 separate vials 100-300 micron sized LC Beads, each loaded with 75 mg of doxorubicin. Ultrasound was used to confirm patency of the right common femoral artery. A permanent ultrasound image was recorded. The right common femoral artery was accessed utilizing a 21 gauge needle micropuncture set. After access, a 5 French sheath was placed. A 5 French Cobra catheter was advanced into the abdominal aorta and used to selectively catheterize the celiac axis. The catheter was further advanced over a guidewire into the common hepatic artery and selective arteriography performed at the level of the common hepatic artery. The catheter was advanced just beyond the gastroduodenal artery into the proper hepatic artery and rotational arteriography performed centered over the liver. Three-dimensional processing of rotation arteriography was performed on a workstation and used for 3 dimensional mapping and additional selective arteriography within the right lobe of the liver. The 5 French catheter was then used to selectively catheterize the right hepatic artery. Selective arteriography was performed of the right hepatic artery. A microcatheter was then advanced through the 5 French catheter and used to selectively catheterize a superior right hepatic artery branch. Selective arteriography was performed. The catheter was then used to further selectively catheterize an additional superior right hepatic arterial branch and selective arteriography performed. Additional selective arteriography was then performed at the level of the initial catheterized superior right hepatic artery branch and selective arteriography performed. Chemoembolization was performed at the level of the superior right hepatic artery branch with installation of 1 vial of doxorubicin loaded beads. The beads were diluted in contrast material and slowly instilled under fluoroscopic  guidance. Additional arteriography was performed to assess flow. The microcatheter was retracted then used to selectively catheterize an additional right hepatic artery branch within the midportion of the right lobe of the liver. Selective arteriography was performed. A second mid right hepatic arterial branch was also selectively catheterized and selective arteriography performed. Chemoembolization was then performed at the level of this branch with installation of part of a vial of loaded beads. Additional arteriography was performed. The microcatheter was removed and additional arteriography performed through the 5 French catheter at the level of the right hepatic artery in order to assess residual blood supply to the tumor. The microcatheter was then reintroduced via the 5 French catheter and used to selectively catheterize an additional inferior right hepatic artery branch. Selective arteriography was performed through the microcatheter. Additional chemoembolization was then performed at the level the inferior right hepatic artery branch via the microcatheter with installation of drug-eluting beads. Additional arteriography was performed. Catheters were removed. Contrast injection was performed in an oblique projection at the level of the right groin in order to assess level of puncture of the right common femoral artery. Arteriotomy closure was then performed with the Cordis ExoSeal device. FINDINGS: Initial arteriography demonstrates numerous irregular arterial branches supplying hypervascular tumor within the  right lobe of the liver extending throughout much of the right lobe and having an oval shape. Reconstructed 3D imaging of arterial blood supply from rotational arteriography demonstrates clear enlargement of the mass since prior imaging with estimated dimensions of approximately 11 x 8.5 x 10 cm compared to approximately 8.2 x 6.2 x 8 cm by CT in November. There was ability to initially characterize and  catheterize superior blood supply to the large tumor via a branch of the right hepatic artery. Selective arteriography demonstrates tumor blush along the superior aspect of the mass. During treatment of this branch with installation of drug-eluting beads there was also reflux of contrast into another branch which was also clearly supplying tumor resulting in further staining of the tumor with treatment. There appear to be approximately 1/3 of the tumor volume able to be treated with the initial installation of particles. This distribution did accommodate essentially 1 vial of the drug-eluting beads. Additional catheterization was then performed at the level of a mid right hepatic arterial branch with a descending course that is clearly supplying a portion of the central aspect of the tumor. This supply was able to be treated effectively with a portion of a vial of the drug-eluting particles. Additional catheterization was then performed of a medial descending right hepatic arterial branch that supplies the inferior aspect of the tumor. This branch was able to be effectively treated with installation of part of a vial of drug-eluting particles. After completion of treatment, there was approximately 1/4 or slightly less volume of a vial of particles left over which were not administered. IMPRESSION: Transcatheter chemoembolization of large right lobe hepatocellular carcinoma as described in detail above which included selective catheterization and transcatheter embolization within 3 vascular territories supplying the tumor. Drug-eluting LC beads loaded with doxorubicin were utilized for the treatment with nearly all of 2 vials loaded with a total of 150 mg of doxorubicin utilized. No was made on the 3 dimensional reconstruction of rotational arteriography of significant enlargement of the tumor since prior CT in November with maximum height of 11 cm today compared to approximately 8 cm previously. The patient will be  observed overnight to treat any post embolic symptoms. Electronically Signed   By: Aletta Edouard M.D.   On: 03/27/2021 16:00   IR Angiogram Selective Each Additional Vessel  Result Date: 03/27/2021 INDICATION: Hepatocellular carcinoma of the right lobe of the liver. The patient is presenting for transcatheter chemoembolization with drug-eluting beads. EXAM: 1. ULTRASOUND GUIDANCE FOR VASCULAR ACCESS OF THE RIGHT COMMON FEMORAL ARTERY 2. SELECTIVE VISCERAL ARTERIOGRAPHY AT THE LEVEL OF THE COMMON HEPATIC ARTERY 3. THREE-DIMENSIONAL RECONSTRUCTION OF ROTATIONAL COMMON HEPATIC ARTERIOGRAPHY 4. ADDITIONAL SELECTIVE RIGHT HEPATIC ARTERIOGRAPHY 5. ADDITIONAL SELECTIVE ARTERIOGRAPHY OF SUPERIOR RIGHT HEPATIC ARTERY BRANCH 6. ADDITIONAL SELECTIVE ARTERIOGRAPHY OF ADDITIONAL SUPERIOR RIGHT HEPATIC ARTERY BRANCH 7. ADDITIONAL SELECTIVE ARTERIOGRAPHY OF MID RIGHT HEPATIC ARTERIAL BRANCH 8. ADDITIONAL SELECTIVE ARTERIOGRAPHY OF MID RIGHT HEPATIC ARTERIAL BRANCH 9. ADDITIONAL SELECTIVE ARTERIOGRAPHY OF INFERIOR RIGHT HEPATIC ARTERIAL BRANCH 10. TRANSCATHETER EMBOLIZATION OF RIGHT LOBE HEPATOCELLULAR CARCINOMA MEDICATIONS: 3.375 g IV Zosyn, 8 mg IV Decadron, 4 mg IV Zofran and 20 mg IV hydralazine ANESTHESIA/SEDATION: Moderate (conscious) sedation was employed during this procedure. A total of Versed 6.0 mg and Fentanyl 300 mcg was administered intravenously by the radiology nurse. Total intra-service moderate Sedation Time: 175 minutes. The patient's level of consciousness and vital signs were monitored continuously by radiology nursing throughout the procedure under my direct supervision. CONTRAST:  171 mL Omnipaque 300 FLUOROSCOPY TIME:  Fluoroscopy Time: 58 minutes and 42 seconds. 2750 mGy. COMPLICATIONS: None immediate. PROCEDURE: Informed consent was obtained from the patient following explanation of the procedure, risks, benefits and alternatives. The patient understands, agrees and consents for the procedure. All  questions were addressed. A time out was performed prior to the initiation of the procedure. Maximal barrier sterile technique utilized including caps, mask, sterile gowns, sterile gloves, large sterile drape, hand hygiene, and chlorhexidine prep. Local anesthesia was provided with 1% lidocaine. The day prior to the procedure, drug-eluting beads were prepared by the Pharmacy with 2 separate vials 100-300 micron sized LC Beads, each loaded with 75 mg of doxorubicin. Ultrasound was used to confirm patency of the right common femoral artery. A permanent ultrasound image was recorded. The right common femoral artery was accessed utilizing a 21 gauge needle micropuncture set. After access, a 5 French sheath was placed. A 5 French Cobra catheter was advanced into the abdominal aorta and used to selectively catheterize the celiac axis. The catheter was further advanced over a guidewire into the common hepatic artery and selective arteriography performed at the level of the common hepatic artery. The catheter was advanced just beyond the gastroduodenal artery into the proper hepatic artery and rotational arteriography performed centered over the liver. Three-dimensional processing of rotation arteriography was performed on a workstation and used for 3 dimensional mapping and additional selective arteriography within the right lobe of the liver. The 5 French catheter was then used to selectively catheterize the right hepatic artery. Selective arteriography was performed of the right hepatic artery. A microcatheter was then advanced through the 5 French catheter and used to selectively catheterize a superior right hepatic artery branch. Selective arteriography was performed. The catheter was then used to further selectively catheterize an additional superior right hepatic arterial branch and selective arteriography performed. Additional selective arteriography was then performed at the level of the initial catheterized  superior right hepatic artery branch and selective arteriography performed. Chemoembolization was performed at the level of the superior right hepatic artery branch with installation of 1 vial of doxorubicin loaded beads. The beads were diluted in contrast material and slowly instilled under fluoroscopic guidance. Additional arteriography was performed to assess flow. The microcatheter was retracted then used to selectively catheterize an additional right hepatic artery branch within the midportion of the right lobe of the liver. Selective arteriography was performed. A second mid right hepatic arterial branch was also selectively catheterized and selective arteriography performed. Chemoembolization was then performed at the level of this branch with installation of part of a vial of loaded beads. Additional arteriography was performed. The microcatheter was removed and additional arteriography performed through the 5 French catheter at the level of the right hepatic artery in order to assess residual blood supply to the tumor. The microcatheter was then reintroduced via the 5 French catheter and used to selectively catheterize an additional inferior right hepatic artery branch. Selective arteriography was performed through the microcatheter. Additional chemoembolization was then performed at the level the inferior right hepatic artery branch via the microcatheter with installation of drug-eluting beads. Additional arteriography was performed. Catheters were removed. Contrast injection was performed in an oblique projection at the level of the right groin in order to assess level of puncture of the right common femoral artery. Arteriotomy closure was then performed with the Cordis ExoSeal device. FINDINGS: Initial arteriography demonstrates numerous irregular arterial branches supplying hypervascular tumor within the right lobe of the liver extending throughout much of the right lobe and having an  oval shape.  Reconstructed 3D imaging of arterial blood supply from rotational arteriography demonstrates clear enlargement of the mass since prior imaging with estimated dimensions of approximately 11 x 8.5 x 10 cm compared to approximately 8.2 x 6.2 x 8 cm by CT in November. There was ability to initially characterize and catheterize superior blood supply to the large tumor via a branch of the right hepatic artery. Selective arteriography demonstrates tumor blush along the superior aspect of the mass. During treatment of this branch with installation of drug-eluting beads there was also reflux of contrast into another branch which was also clearly supplying tumor resulting in further staining of the tumor with treatment. There appear to be approximately 1/3 of the tumor volume able to be treated with the initial installation of particles. This distribution did accommodate essentially 1 vial of the drug-eluting beads. Additional catheterization was then performed at the level of a mid right hepatic arterial branch with a descending course that is clearly supplying a portion of the central aspect of the tumor. This supply was able to be treated effectively with a portion of a vial of the drug-eluting particles. Additional catheterization was then performed of a medial descending right hepatic arterial branch that supplies the inferior aspect of the tumor. This branch was able to be effectively treated with installation of part of a vial of drug-eluting particles. After completion of treatment, there was approximately 1/4 or slightly less volume of a vial of particles left over which were not administered. IMPRESSION: Transcatheter chemoembolization of large right lobe hepatocellular carcinoma as described in detail above which included selective catheterization and transcatheter embolization within 3 vascular territories supplying the tumor. Drug-eluting LC beads loaded with doxorubicin were utilized for the treatment with  nearly all of 2 vials loaded with a total of 150 mg of doxorubicin utilized. No was made on the 3 dimensional reconstruction of rotational arteriography of significant enlargement of the tumor since prior CT in November with maximum height of 11 cm today compared to approximately 8 cm previously. The patient will be observed overnight to treat any post embolic symptoms. Electronically Signed   By: Aletta Edouard M.D.   On: 03/27/2021 16:00   IR 3D Independent Darreld Mclean  Result Date: 03/27/2021 INDICATION: Hepatocellular carcinoma of the right lobe of the liver. The patient is presenting for transcatheter chemoembolization with drug-eluting beads. EXAM: 1. ULTRASOUND GUIDANCE FOR VASCULAR ACCESS OF THE RIGHT COMMON FEMORAL ARTERY 2. SELECTIVE VISCERAL ARTERIOGRAPHY AT THE LEVEL OF THE COMMON HEPATIC ARTERY 3. THREE-DIMENSIONAL RECONSTRUCTION OF ROTATIONAL COMMON HEPATIC ARTERIOGRAPHY 4. ADDITIONAL SELECTIVE RIGHT HEPATIC ARTERIOGRAPHY 5. ADDITIONAL SELECTIVE ARTERIOGRAPHY OF SUPERIOR RIGHT HEPATIC ARTERY BRANCH 6. ADDITIONAL SELECTIVE ARTERIOGRAPHY OF ADDITIONAL SUPERIOR RIGHT HEPATIC ARTERY BRANCH 7. ADDITIONAL SELECTIVE ARTERIOGRAPHY OF MID RIGHT HEPATIC ARTERIAL BRANCH 8. ADDITIONAL SELECTIVE ARTERIOGRAPHY OF MID RIGHT HEPATIC ARTERIAL BRANCH 9. ADDITIONAL SELECTIVE ARTERIOGRAPHY OF INFERIOR RIGHT HEPATIC ARTERIAL BRANCH 10. TRANSCATHETER EMBOLIZATION OF RIGHT LOBE HEPATOCELLULAR CARCINOMA MEDICATIONS: 3.375 g IV Zosyn, 8 mg IV Decadron, 4 mg IV Zofran and 20 mg IV hydralazine ANESTHESIA/SEDATION: Moderate (conscious) sedation was employed during this procedure. A total of Versed 6.0 mg and Fentanyl 300 mcg was administered intravenously by the radiology nurse. Total intra-service moderate Sedation Time: 175 minutes. The patient's level of consciousness and vital signs were monitored continuously by radiology nursing throughout the procedure under my direct supervision. CONTRAST:  171 mL Omnipaque 300 FLUOROSCOPY  TIME:  Fluoroscopy Time: 58 minutes and 42 seconds. 2750 mGy. COMPLICATIONS: None immediate. PROCEDURE: Informed consent  was obtained from the patient following explanation of the procedure, risks, benefits and alternatives. The patient understands, agrees and consents for the procedure. All questions were addressed. A time out was performed prior to the initiation of the procedure. Maximal barrier sterile technique utilized including caps, mask, sterile gowns, sterile gloves, large sterile drape, hand hygiene, and chlorhexidine prep. Local anesthesia was provided with 1% lidocaine. The day prior to the procedure, drug-eluting beads were prepared by the Pharmacy with 2 separate vials 100-300 micron sized LC Beads, each loaded with 75 mg of doxorubicin. Ultrasound was used to confirm patency of the right common femoral artery. A permanent ultrasound image was recorded. The right common femoral artery was accessed utilizing a 21 gauge needle micropuncture set. After access, a 5 French sheath was placed. A 5 French Cobra catheter was advanced into the abdominal aorta and used to selectively catheterize the celiac axis. The catheter was further advanced over a guidewire into the common hepatic artery and selective arteriography performed at the level of the common hepatic artery. The catheter was advanced just beyond the gastroduodenal artery into the proper hepatic artery and rotational arteriography performed centered over the liver. Three-dimensional processing of rotation arteriography was performed on a workstation and used for 3 dimensional mapping and additional selective arteriography within the right lobe of the liver. The 5 French catheter was then used to selectively catheterize the right hepatic artery. Selective arteriography was performed of the right hepatic artery. A microcatheter was then advanced through the 5 French catheter and used to selectively catheterize a superior right hepatic artery branch.  Selective arteriography was performed. The catheter was then used to further selectively catheterize an additional superior right hepatic arterial branch and selective arteriography performed. Additional selective arteriography was then performed at the level of the initial catheterized superior right hepatic artery branch and selective arteriography performed. Chemoembolization was performed at the level of the superior right hepatic artery branch with installation of 1 vial of doxorubicin loaded beads. The beads were diluted in contrast material and slowly instilled under fluoroscopic guidance. Additional arteriography was performed to assess flow. The microcatheter was retracted then used to selectively catheterize an additional right hepatic artery branch within the midportion of the right lobe of the liver. Selective arteriography was performed. A second mid right hepatic arterial branch was also selectively catheterized and selective arteriography performed. Chemoembolization was then performed at the level of this branch with installation of part of a vial of loaded beads. Additional arteriography was performed. The microcatheter was removed and additional arteriography performed through the 5 French catheter at the level of the right hepatic artery in order to assess residual blood supply to the tumor. The microcatheter was then reintroduced via the 5 French catheter and used to selectively catheterize an additional inferior right hepatic artery branch. Selective arteriography was performed through the microcatheter. Additional chemoembolization was then performed at the level the inferior right hepatic artery branch via the microcatheter with installation of drug-eluting beads. Additional arteriography was performed. Catheters were removed. Contrast injection was performed in an oblique projection at the level of the right groin in order to assess level of puncture of the right common femoral artery.  Arteriotomy closure was then performed with the Cordis ExoSeal device. FINDINGS: Initial arteriography demonstrates numerous irregular arterial branches supplying hypervascular tumor within the right lobe of the liver extending throughout much of the right lobe and having an oval shape. Reconstructed 3D imaging of arterial blood supply from rotational arteriography demonstrates clear enlargement  of the mass since prior imaging with estimated dimensions of approximately 11 x 8.5 x 10 cm compared to approximately 8.2 x 6.2 x 8 cm by CT in November. There was ability to initially characterize and catheterize superior blood supply to the large tumor via a branch of the right hepatic artery. Selective arteriography demonstrates tumor blush along the superior aspect of the mass. During treatment of this branch with installation of drug-eluting beads there was also reflux of contrast into another branch which was also clearly supplying tumor resulting in further staining of the tumor with treatment. There appear to be approximately 1/3 of the tumor volume able to be treated with the initial installation of particles. This distribution did accommodate essentially 1 vial of the drug-eluting beads. Additional catheterization was then performed at the level of a mid right hepatic arterial branch with a descending course that is clearly supplying a portion of the central aspect of the tumor. This supply was able to be treated effectively with a portion of a vial of the drug-eluting particles. Additional catheterization was then performed of a medial descending right hepatic arterial branch that supplies the inferior aspect of the tumor. This branch was able to be effectively treated with installation of part of a vial of drug-eluting particles. After completion of treatment, there was approximately 1/4 or slightly less volume of a vial of particles left over which were not administered. IMPRESSION: Transcatheter  chemoembolization of large right lobe hepatocellular carcinoma as described in detail above which included selective catheterization and transcatheter embolization within 3 vascular territories supplying the tumor. Drug-eluting LC beads loaded with doxorubicin were utilized for the treatment with nearly all of 2 vials loaded with a total of 150 mg of doxorubicin utilized. No was made on the 3 dimensional reconstruction of rotational arteriography of significant enlargement of the tumor since prior CT in November with maximum height of 11 cm today compared to approximately 8 cm previously. The patient will be observed overnight to treat any post embolic symptoms. Electronically Signed   By: Aletta Edouard M.D.   On: 03/27/2021 16:00   IR US Guidance  Result Date: 03/27/2021 INDICATION: Hepatocellular carcinoma of the right lobe of the liver. The patient is presenting for transcatheter chemoembolization with drug-eluting beads. EXAM: 1. ULTRASOUND GUIDANCE FOR VASCULAR ACCESS OF THE RIGHT COMMON FEMORAL ARTERY 2. SELECTIVE VISCERAL ARTERIOGRAPHY AT THE LEVEL OF THE COMMON HEPATIC ARTERY 3. THREE-DIMENSIONAL RECONSTRUCTION OF ROTATIONAL COMMON HEPATIC ARTERIOGRAPHY 4. ADDITIONAL SELECTIVE RIGHT HEPATIC ARTERIOGRAPHY 5. ADDITIONAL SELECTIVE ARTERIOGRAPHY OF SUPERIOR RIGHT HEPATIC ARTERY BRANCH 6. ADDITIONAL SELECTIVE ARTERIOGRAPHY OF ADDITIONAL SUPERIOR RIGHT HEPATIC ARTERY BRANCH 7. ADDITIONAL SELECTIVE ARTERIOGRAPHY OF MID RIGHT HEPATIC ARTERIAL BRANCH 8. ADDITIONAL SELECTIVE ARTERIOGRAPHY OF MID RIGHT HEPATIC ARTERIAL BRANCH 9. ADDITIONAL SELECTIVE ARTERIOGRAPHY OF INFERIOR RIGHT HEPATIC ARTERIAL BRANCH 10. TRANSCATHETER EMBOLIZATION OF RIGHT LOBE HEPATOCELLULAR CARCINOMA MEDICATIONS: 3.375 g IV Zosyn, 8 mg IV Decadron, 4 mg IV Zofran and 20 mg IV hydralazine ANESTHESIA/SEDATION: Moderate (conscious) sedation was employed during this procedure. A total of Versed 6.0 mg and Fentanyl 300 mcg was administered  intravenously by the radiology nurse. Total intra-service moderate Sedation Time: 175 minutes. The patient's level of consciousness and vital signs were monitored continuously by radiology nursing throughout the procedure under my direct supervision. CONTRAST:  171 mL Omnipaque 300 FLUOROSCOPY TIME:  Fluoroscopy Time: 58 minutes and 42 seconds. 2750 mGy. COMPLICATIONS: None immediate. PROCEDURE: Informed consent was obtained from the patient following explanation of the procedure, risks, benefits and alternatives. The patient understands,  agrees and consents for the procedure. All questions were addressed. A time out was performed prior to the initiation of the procedure. Maximal barrier sterile technique utilized including caps, mask, sterile gowns, sterile gloves, large sterile drape, hand hygiene, and chlorhexidine prep. Local anesthesia was provided with 1% lidocaine. The day prior to the procedure, drug-eluting beads were prepared by the Pharmacy with 2 separate vials 100-300 micron sized LC Beads, each loaded with 75 mg of doxorubicin. Ultrasound was used to confirm patency of the right common femoral artery. A permanent ultrasound image was recorded. The right common femoral artery was accessed utilizing a 21 gauge needle micropuncture set. After access, a 5 French sheath was placed. A 5 French Cobra catheter was advanced into the abdominal aorta and used to selectively catheterize the celiac axis. The catheter was further advanced over a guidewire into the common hepatic artery and selective arteriography performed at the level of the common hepatic artery. The catheter was advanced just beyond the gastroduodenal artery into the proper hepatic artery and rotational arteriography performed centered over the liver. Three-dimensional processing of rotation arteriography was performed on a workstation and used for 3 dimensional mapping and additional selective arteriography within the right lobe of the liver.  The 5 French catheter was then used to selectively catheterize the right hepatic artery. Selective arteriography was performed of the right hepatic artery. A microcatheter was then advanced through the 5 French catheter and used to selectively catheterize a superior right hepatic artery branch. Selective arteriography was performed. The catheter was then used to further selectively catheterize an additional superior right hepatic arterial branch and selective arteriography performed. Additional selective arteriography was then performed at the level of the initial catheterized superior right hepatic artery branch and selective arteriography performed. Chemoembolization was performed at the level of the superior right hepatic artery branch with installation of 1 vial of doxorubicin loaded beads. The beads were diluted in contrast material and slowly instilled under fluoroscopic guidance. Additional arteriography was performed to assess flow. The microcatheter was retracted then used to selectively catheterize an additional right hepatic artery branch within the midportion of the right lobe of the liver. Selective arteriography was performed. A second mid right hepatic arterial branch was also selectively catheterized and selective arteriography performed. Chemoembolization was then performed at the level of this branch with installation of part of a vial of loaded beads. Additional arteriography was performed. The microcatheter was removed and additional arteriography performed through the 5 French catheter at the level of the right hepatic artery in order to assess residual blood supply to the tumor. The microcatheter was then reintroduced via the 5 French catheter and used to selectively catheterize an additional inferior right hepatic artery branch. Selective arteriography was performed through the microcatheter. Additional chemoembolization was then performed at the level the inferior right hepatic artery branch  via the microcatheter with installation of drug-eluting beads. Additional arteriography was performed. Catheters were removed. Contrast injection was performed in an oblique projection at the level of the right groin in order to assess level of puncture of the right common femoral artery. Arteriotomy closure was then performed with the Cordis ExoSeal device. FINDINGS: Initial arteriography demonstrates numerous irregular arterial branches supplying hypervascular tumor within the right lobe of the liver extending throughout much of the right lobe and having an oval shape. Reconstructed 3D imaging of arterial blood supply from rotational arteriography demonstrates clear enlargement of the mass since prior imaging with estimated dimensions of approximately 11 x 8.5 x 10 cm  compared to approximately 8.2 x 6.2 x 8 cm by CT in November. There was ability to initially characterize and catheterize superior blood supply to the large tumor via a branch of the right hepatic artery. Selective arteriography demonstrates tumor blush along the superior aspect of the mass. During treatment of this branch with installation of drug-eluting beads there was also reflux of contrast into another branch which was also clearly supplying tumor resulting in further staining of the tumor with treatment. There appear to be approximately 1/3 of the tumor volume able to be treated with the initial installation of particles. This distribution did accommodate essentially 1 vial of the drug-eluting beads. Additional catheterization was then performed at the level of a mid right hepatic arterial branch with a descending course that is clearly supplying a portion of the central aspect of the tumor. This supply was able to be treated effectively with a portion of a vial of the drug-eluting particles. Additional catheterization was then performed of a medial descending right hepatic arterial branch that supplies the inferior aspect of the tumor. This  branch was able to be effectively treated with installation of part of a vial of drug-eluting particles. After completion of treatment, there was approximately 1/4 or slightly less volume of a vial of particles left over which were not administered. IMPRESSION: Transcatheter chemoembolization of large right lobe hepatocellular carcinoma as described in detail above which included selective catheterization and transcatheter embolization within 3 vascular territories supplying the tumor. Drug-eluting LC beads loaded with doxorubicin were utilized for the treatment with nearly all of 2 vials loaded with a total of 150 mg of doxorubicin utilized. No was made on the 3 dimensional reconstruction of rotational arteriography of significant enlargement of the tumor since prior CT in November with maximum height of 11 cm today compared to approximately 8 cm previously. The patient will be observed overnight to treat any post embolic symptoms. Electronically Signed   By: Aletta Edouard M.D.   On: 03/27/2021 16:00   IR EMBO TUMOR ORGAN ISCHEMIA INFARCT INC GUIDE ROADMAPPING  Result Date: 03/27/2021 INDICATION: Hepatocellular carcinoma of the right lobe of the liver. The patient is presenting for transcatheter chemoembolization with drug-eluting beads. EXAM: 1. ULTRASOUND GUIDANCE FOR VASCULAR ACCESS OF THE RIGHT COMMON FEMORAL ARTERY 2. SELECTIVE VISCERAL ARTERIOGRAPHY AT THE LEVEL OF THE COMMON HEPATIC ARTERY 3. THREE-DIMENSIONAL RECONSTRUCTION OF ROTATIONAL COMMON HEPATIC ARTERIOGRAPHY 4. ADDITIONAL SELECTIVE RIGHT HEPATIC ARTERIOGRAPHY 5. ADDITIONAL SELECTIVE ARTERIOGRAPHY OF SUPERIOR RIGHT HEPATIC ARTERY BRANCH 6. ADDITIONAL SELECTIVE ARTERIOGRAPHY OF ADDITIONAL SUPERIOR RIGHT HEPATIC ARTERY BRANCH 7. ADDITIONAL SELECTIVE ARTERIOGRAPHY OF MID RIGHT HEPATIC ARTERIAL BRANCH 8. ADDITIONAL SELECTIVE ARTERIOGRAPHY OF MID RIGHT HEPATIC ARTERIAL BRANCH 9. ADDITIONAL SELECTIVE ARTERIOGRAPHY OF INFERIOR RIGHT HEPATIC ARTERIAL  BRANCH 10. TRANSCATHETER EMBOLIZATION OF RIGHT LOBE HEPATOCELLULAR CARCINOMA MEDICATIONS: 3.375 g IV Zosyn, 8 mg IV Decadron, 4 mg IV Zofran and 20 mg IV hydralazine ANESTHESIA/SEDATION: Moderate (conscious) sedation was employed during this procedure. A total of Versed 6.0 mg and Fentanyl 300 mcg was administered intravenously by the radiology nurse. Total intra-service moderate Sedation Time: 175 minutes. The patient's level of consciousness and vital signs were monitored continuously by radiology nursing throughout the procedure under my direct supervision. CONTRAST:  171 mL Omnipaque 300 FLUOROSCOPY TIME:  Fluoroscopy Time: 58 minutes and 42 seconds. 2750 mGy. COMPLICATIONS: None immediate. PROCEDURE: Informed consent was obtained from the patient following explanation of the procedure, risks, benefits and alternatives. The patient understands, agrees and consents for the procedure. All questions were addressed. A  time out was performed prior to the initiation of the procedure. Maximal barrier sterile technique utilized including caps, mask, sterile gowns, sterile gloves, large sterile drape, hand hygiene, and chlorhexidine prep. Local anesthesia was provided with 1% lidocaine. The day prior to the procedure, drug-eluting beads were prepared by the Pharmacy with 2 separate vials 100-300 micron sized LC Beads, each loaded with 75 mg of doxorubicin. Ultrasound was used to confirm patency of the right common femoral artery. A permanent ultrasound image was recorded. The right common femoral artery was accessed utilizing a 21 gauge needle micropuncture set. After access, a 5 French sheath was placed. A 5 French Cobra catheter was advanced into the abdominal aorta and used to selectively catheterize the celiac axis. The catheter was further advanced over a guidewire into the common hepatic artery and selective arteriography performed at the level of the common hepatic artery. The catheter was advanced just beyond the  gastroduodenal artery into the proper hepatic artery and rotational arteriography performed centered over the liver. Three-dimensional processing of rotation arteriography was performed on a workstation and used for 3 dimensional mapping and additional selective arteriography within the right lobe of the liver. The 5 French catheter was then used to selectively catheterize the right hepatic artery. Selective arteriography was performed of the right hepatic artery. A microcatheter was then advanced through the 5 French catheter and used to selectively catheterize a superior right hepatic artery branch. Selective arteriography was performed. The catheter was then used to further selectively catheterize an additional superior right hepatic arterial branch and selective arteriography performed. Additional selective arteriography was then performed at the level of the initial catheterized superior right hepatic artery branch and selective arteriography performed. Chemoembolization was performed at the level of the superior right hepatic artery branch with installation of 1 vial of doxorubicin loaded beads. The beads were diluted in contrast material and slowly instilled under fluoroscopic guidance. Additional arteriography was performed to assess flow. The microcatheter was retracted then used to selectively catheterize an additional right hepatic artery branch within the midportion of the right lobe of the liver. Selective arteriography was performed. A second mid right hepatic arterial branch was also selectively catheterized and selective arteriography performed. Chemoembolization was then performed at the level of this branch with installation of part of a vial of loaded beads. Additional arteriography was performed. The microcatheter was removed and additional arteriography performed through the 5 French catheter at the level of the right hepatic artery in order to assess residual blood supply to the tumor. The  microcatheter was then reintroduced via the 5 French catheter and used to selectively catheterize an additional inferior right hepatic artery branch. Selective arteriography was performed through the microcatheter. Additional chemoembolization was then performed at the level the inferior right hepatic artery branch via the microcatheter with installation of drug-eluting beads. Additional arteriography was performed. Catheters were removed. Contrast injection was performed in an oblique projection at the level of the right groin in order to assess level of puncture of the right common femoral artery. Arteriotomy closure was then performed with the Cordis ExoSeal device. FINDINGS: Initial arteriography demonstrates numerous irregular arterial branches supplying hypervascular tumor within the right lobe of the liver extending throughout much of the right lobe and having an oval shape. Reconstructed 3D imaging of arterial blood supply from rotational arteriography demonstrates clear enlargement of the mass since prior imaging with estimated dimensions of approximately 11 x 8.5 x 10 cm compared to approximately 8.2 x 6.2 x 8 cm by CT  in November. There was ability to initially characterize and catheterize superior blood supply to the large tumor via a branch of the right hepatic artery. Selective arteriography demonstrates tumor blush along the superior aspect of the mass. During treatment of this branch with installation of drug-eluting beads there was also reflux of contrast into another branch which was also clearly supplying tumor resulting in further staining of the tumor with treatment. There appear to be approximately 1/3 of the tumor volume able to be treated with the initial installation of particles. This distribution did accommodate essentially 1 vial of the drug-eluting beads. Additional catheterization was then performed at the level of a mid right hepatic arterial branch with a descending course that is  clearly supplying a portion of the central aspect of the tumor. This supply was able to be treated effectively with a portion of a vial of the drug-eluting particles. Additional catheterization was then performed of a medial descending right hepatic arterial branch that supplies the inferior aspect of the tumor. This branch was able to be effectively treated with installation of part of a vial of drug-eluting particles. After completion of treatment, there was approximately 1/4 or slightly less volume of a vial of particles left over which were not administered. IMPRESSION: Transcatheter chemoembolization of large right lobe hepatocellular carcinoma as described in detail above which included selective catheterization and transcatheter embolization within 3 vascular territories supplying the tumor. Drug-eluting LC beads loaded with doxorubicin were utilized for the treatment with nearly all of 2 vials loaded with a total of 150 mg of doxorubicin utilized. No was made on the 3 dimensional reconstruction of rotational arteriography of significant enlargement of the tumor since prior CT in November with maximum height of 11 cm today compared to approximately 8 cm previously. The patient will be observed overnight to treat any post embolic symptoms. Electronically Signed   By: Aletta Edouard M.D.   On: 03/27/2021 16:00    EKG: Independently reviewed.  Normal sinus rhythm  Time spent: 55 minutes  Charmagne Buhl [facility] 03/27/2021

## 2021-03-27 NOTE — Procedures (Signed)
Interventional Radiology Procedure Note  Procedure: Hepatic arteriography with embolization of hepatocellular carcinoma  Access: Right CFA  Complications: None  Estimated Blood Loss: < 10 mL  Findings: Common hepatic and right hepatic arteriography demonstrates large tumor in right lobe of liver supplied by several branches of right hepatic artery.  Arterial supply to tumor mapped with 3-D rotational arteriography, 3-D reconstruction and multiple selective arteriograms at level of right hepatic artery branches.  Chemoembolization performed at level of 3 separate arterial trunks supplying tumor. 150 mg of doxorubicin loaded 100-300 micron LC drug eluting beads were utilized during the procedure. Nearly full dose of loaded particles used for procedure with extra roughly 1/4 vial left after procedure. Good occlusion of tumor supply and staining of tumor achieved during procedure.  Plan: 4 hour bedrest, overnight observation, treatment of post-embolization symptoms/syndrome overnight  Brennyn Haisley T. Kathlene Cote, M.D Pager:  [phone]

## 2021-03-27 NOTE — Discharge Instructions (Addendum)
You will find you need to rest and stay hydrated as the chemoembolization takes effect.  Do call [phone] or [phone] with questions or concerns.

## 2021-03-27 NOTE — H&P (Signed)
Chief Complaint: Patient was seen in consultation today for No chief complaint on file.  at the request of Franklin Rivera  Referring Physician(s): Franklin Rivera  Supervising Physician: Franklin Rivera  Patient Status: [facility] - Out-pt  History of Present Illness: Franklin Rivera is a 70 y.o. male seen previously in clinic for a consult in treatment of hepatocellular carcinoma.  No metastatic disease identified on imaging.  There was fairly significant enlargement of the mass over a period of approximately 3.5 weeks and decision was made to pursue chemoembolization of the mass.    He presents today overall feeling well.  He endorses a little fatigue over the past week.  He denies headaches, dizziness, blurred vision, rhinorrhea, congestion, cough, SOB, difficulty breathing, chest pain, palpitations, abdominal pain, N/V/D.   Past Medical History:  Diagnosis Date   Allergy    Headache    Hyperlipidemia     Past Surgical History:  Procedure Laterality Date   BLADDER SURGERY     IR RADIOLOGIST EVAL & MGMT  03/07/2021   NASAL FRACTURE SURGERY     Skull Surgery      Allergies: Patient has no known allergies.  Medications: Prior to Admission medications   Medication Sig Start Date End Date Taking? Authorizing Provider  cetirizine (ZYRTEC) 10 MG tablet Take 1 tablet (10 mg total) by mouth daily. 01/08/21   Franklin Barrack, MD  hydrOXYzine (ATARAX/VISTARIL) 50 MG tablet Take 1 tablet (50 mg total) by mouth at bedtime as needed (sleep). 01/08/21   Franklin Barrack, MD  tadalafil (CIALIS) 10 MG tablet Take 1 tablet (10 mg total) by mouth daily as needed for erectile dysfunction. 05/25/20   Franklin Barrack, MD     Family History  Problem Relation Age of Onset   Cancer Neg Hx     Social History   Socioeconomic History   Marital status: Married    Spouse name: Not on file   Number of children: Not on file   Years of education: Not on file   Highest education level: Not on  file  Occupational History   Not on file  Tobacco Use   Smoking status: Never   Smokeless tobacco: Never  Vaping Use   Vaping Use: Never used  Substance and Sexual Activity   Alcohol use: Never   Drug use: Not Currently   Sexual activity: Not on file  Other Topics Concern   Not on file  Social History Narrative   Not on file   Social Determinants of Health   Financial Resource Strain: Low Risk    Difficulty of Paying Living Expenses: Not hard at all  Food Insecurity: No Food Insecurity   Worried About Running Out of Food in the Last Year: Never true   [place] in the Last Year: Never true  Transportation Needs: No Transportation Needs   Lack of Transportation (Medical): No   Lack of Transportation (Non-Medical): No  Physical Activity: Inactive   Days of Exercise per Week: 0 days   Minutes of Exercise per Session: 0 min  Stress: No Stress Concern Present   Feeling of Stress : Not at all  Social Connections: Moderately Isolated   Frequency of Communication with Friends and Family: More than three times a week   Frequency of Social Gatherings with Friends and Family: More than three times a week   Attends Religious Services: Never   Marine scientist or Organizations: No   Attends Archivist  Meetings: Never   Marital Status: Married    Review of Systems: A 12 point ROS discussed and pertinent positives are indicated in the HPI above.  All other systems are negative.   Vital Signs: There were no vitals taken for this visit.  Physical Exam Constitutional:      General: He is not in acute distress.    Appearance: He is not ill-appearing.  HENT:     Head: Normocephalic and atraumatic.     Mouth/Throat:     Mouth: Mucous membranes are moist.     Pharynx: Oropharynx is clear.  Eyes:     Extraocular Movements: Extraocular movements intact.  Cardiovascular:     Rate and Rhythm: Normal rate and regular rhythm.  Pulmonary:     Effort: Pulmonary  effort is normal.     Breath sounds: Normal breath sounds.  Abdominal:     General: Abdomen is flat.     Palpations: Abdomen is soft.  Skin:    General: Skin is warm and dry.     Capillary Refill: Capillary refill takes less than 2 seconds.  Neurological:     General: No focal deficit present.     Mental Status: He is alert and oriented to person, place, and time.  Psychiatric:        Mood and Affect: Mood normal.        Thought Content: Thought content normal.    Imaging: IR Radiologist Eval & Mgmt  Result Date: 03/08/2021 Please refer to notes tab for details about interventional procedure. (Op Note)   Labs:  CBC: Recent Labs    01/11/21 1511 01/21/21 1230 03/18/21 1533 03/27/21 0720  WBC 7.2 6.1 3.9* 4.8  HGB 13.2 12.1* 13.4 14.0  HCT 40.6 37.6* 42.1 42.6  PLT 208 274 204 213    COAGS: Recent Labs    01/11/21 2005 01/21/21 1230 03/27/21 0720  INR 1.0 1.0 1.0  APTT  --   --  27    BMP: Recent Labs    01/11/21 1511 01/21/21 1230 03/18/21 1533 03/27/21 0720  NA 133* 137 140 131*  K 4.0 4.5 4.5 4.2  CL 102 102 102 101  CO2 22 32 29 26  GLUCOSE 144* 98 92 105*  BUN 13 19 17 20   CALCIUM 8.7* 9.2 9.2 9.0  CREATININE 0.81 0.90 0.93 0.69  GFRNONAA >60 >60 >60 >60    LIVER FUNCTION TESTS: Recent Labs    01/11/21 1511 01/21/21 1230 03/18/21 1533 03/27/21 0720  BILITOT 0.6 0.3 0.3 0.6  AST 26 32 41 41  ALT 32 36 67* 61*  ALKPHOS 57 68 74 81  PROT 6.9 8.1 8.1 8.6*  ALBUMIN 3.5 4.1 3.8 4.1    TUMOR MARKERS: No results for input(s): AFPTM, CEA, CA199, CHROMGRNA in the last 8760 hours.  Assessment and Plan:  Hepatocellular Carcinoma Covid+, asymptomatic  [name] presents today for chemoembolization of his hepatic mass.  He will require overnight observation.  His Covid+ status is an incidental finding.  He is afebrile and asymptomatic, understands there may be increased side effects of treatment secondary to Covid, and agrees to wear a  mask for the duration of his stay.  Decision was made to proceed with treatment with planned discharge tomorrow.  Risks and benefits of hepatic mass chemoembolization were discussed with the patient including, but not limited to, bleeding, infection, vascular injury, contrast induced renal failure, post procedural pain, nausea, vomiting, fatigue, progression of liver failure, chemical cholecystitis, or need for  additional procedures.  This interventional procedure involves the use of X-rays and because of the nature of the planned procedure, it is possible that we will have prolonged use of X-ray fluoroscopy.  Potential radiation risks to you include (but are not limited to) the following: - A slightly elevated risk for cancer  several years later in life. This risk is typically less than 0.5% percent. This risk is low in comparison to the normal incidence of human cancer, which is 33% for women and 50% for men according to the [place]. - Radiation induced injury can include skin redness, resembling a rash, tissue breakdown / ulcers and hair loss (which can be temporary or permanent).   The likelihood of either of these occurring depends on the difficulty of the procedure and whether you are sensitive to radiation due to previous procedures, disease, or genetic conditions.   IF your procedure requires a prolonged use of radiation, you will be notified and given written instructions for further action.  It is your responsibility to monitor the irradiated area for the 2 weeks following the procedure and to notify your physician if you are concerned that you have suffered a radiation induced injury.    All of the patient's questions were answered, patient is agreeable to proceed.  Consent signed and in chart.  Thank you for this interesting consult.  I greatly enjoyed meeting Franklin Rivera and look forward to participating in their care.  A copy of this report was sent to the  requesting provider on this date.  Electronically Signed: Pasty Spillers, PA 03/27/2021, 8:59 AM   I spent a total of 40 Minutes in face to face in clinical consultation, greater than 50% of which was counseling/coordinating care for [facility]

## 2021-03-27 NOTE — Plan of Care (Signed)
  Problem: Education: Goal: Knowledge of General Education information will improve Description Including pain rating scale, medication(s)/side effects and non-pharmacologic comfort measures Outcome: Progressing   

## 2021-03-28 ENCOUNTER — Other Ambulatory Visit (HOSPITAL_COMMUNITY): Payer: Medicare HMO

## 2021-03-28 DIAGNOSIS — U071 COVID-19: Secondary | ICD-10-CM | POA: Diagnosis not present

## 2021-03-28 DIAGNOSIS — R030 Elevated blood-pressure reading, without diagnosis of hypertension: Secondary | ICD-10-CM | POA: Diagnosis not present

## 2021-03-28 DIAGNOSIS — Z9889 Other specified postprocedural states: Secondary | ICD-10-CM | POA: Diagnosis not present

## 2021-03-28 DIAGNOSIS — R0789 Other chest pain: Secondary | ICD-10-CM | POA: Diagnosis not present

## 2021-03-28 DIAGNOSIS — C220 Liver cell carcinoma: Secondary | ICD-10-CM | POA: Diagnosis not present

## 2021-03-28 LAB — COMPREHENSIVE METABOLIC PANEL
ALT: 450 U/L — ABNORMAL HIGH (ref 0–44)
AST: 383 U/L — ABNORMAL HIGH (ref 15–41)
Albumin: 3.7 g/dL (ref 3.5–5.0)
Alkaline Phosphatase: 92 U/L (ref 38–126)
Anion gap: 8 (ref 5–15)
BUN: 12 mg/dL (ref 8–23)
CO2: 23 mmol/L (ref 22–32)
Calcium: 8.8 mg/dL — ABNORMAL LOW (ref 8.9–10.3)
Chloride: 95 mmol/L — ABNORMAL LOW (ref 98–111)
Creatinine, Ser: 0.62 mg/dL (ref 0.61–1.24)
GFR, Estimated: >60 mL/min (ref >60)
Glucose, Bld: 116 mg/dL — ABNORMAL HIGH (ref 70–99)
Potassium: 4.7 mmol/L (ref 3.5–5.1)
Sodium: 126 mmol/L — ABNORMAL LOW (ref 135–145)
Total Bilirubin: 0.7 mg/dL (ref 0.3–1.2)
Total Protein: 8.2 g/dL — ABNORMAL HIGH (ref 6.5–8.1)

## 2021-03-28 LAB — BASIC METABOLIC PANEL
Anion gap: 10 (ref 5–15)
BUN: 12 mg/dL (ref 8–23)
CO2: 25 mmol/L (ref 22–32)
Calcium: 9 mg/dL (ref 8.9–10.3)
Chloride: 95 mmol/L — ABNORMAL LOW (ref 98–111)
Creatinine, Ser: 0.78 mg/dL (ref 0.61–1.24)
GFR, Estimated: >60 mL/min (ref >60)
Glucose, Bld: 108 mg/dL — ABNORMAL HIGH (ref 70–99)
Potassium: 5 mmol/L (ref 3.5–5.1)
Sodium: 130 mmol/L — ABNORMAL LOW (ref 135–145)

## 2021-03-28 LAB — CBC
HCT: 43.2 % (ref 39.0–52.0)
Hemoglobin: 14.3 g/dL (ref 13.0–17.0)
MCH: 28.5 pg (ref 26.0–34.0)
MCHC: 33.1 g/dL (ref 30.0–36.0)
MCV: 86.1 fL (ref 80.0–100.0)
Platelets: 175 10*3/uL (ref 150–400)
RBC: 5.02 MIL/uL (ref 4.22–5.81)
RDW: 13.4 % (ref 11.5–15.5)
WBC: 3.7 10*3/uL — ABNORMAL LOW (ref 4.0–10.5)
nRBC: 0 % (ref 0.0–0.2)

## 2021-03-28 LAB — MAGNESIUM: Magnesium: 2.1 mg/dL (ref 1.7–2.4)

## 2021-03-28 MED ORDER — SODIUM CHLORIDE 0.9 % IV BOLUS
500.0000 mL | Freq: Once | INTRAVENOUS | Status: AC
  Administered 2021-03-28: 09:00:00 500 mL via INTRAVENOUS

## 2021-03-28 NOTE — TOC Transition Note (Signed)
Transition of Care [facility]) - CM/SW Discharge Note   Patient Details  Name: Franklin Rivera MRN: [id number] Date of Birth: 12/21/1950  Transition of Care [facility]) CM/SW Contact:  Leeroy Cha, RN Phone Number: 03/28/2021, 2:12 PM   Clinical Narrative:    Dcd to home with no toc needs    Final next level of care: Home/Self Care Barriers to Discharge: Barriers Resolved   Patient Goals and CMS Choice Patient states their goals for this hospitalization and ongoing recovery are:: to go home CMS Medicare.gov Compare Post Acute Care list provided to:: Patient Choice offered to / list presented to : Patient  Discharge Placement                       Discharge Plan and Services   Discharge Planning Services: CM Consult                                 Social Determinants of Health (SDOH) Interventions     Readmission Risk Interventions No flowsheet data found.

## 2021-03-28 NOTE — TOC Initial Note (Addendum)
Transition of Care [facility]) - Initial/Assessment Note    Patient Details  Name: Franklin Rivera MRN: [id number] Date of Birth: [birth year]  Transition of Care [facility]) CM/SW Contact:    Leeroy Cha, RN Phone Number: 03/28/2021, 8:32 AM  Clinical Narrative:                  Transition of Care (TOC) Screening Note Patient dcd to home with no toc needs  Patient Details  Name: Franklin Rivera Date of Birth: [birth year]   Transition of Care [facility]) CM/SW Contact:    Leeroy Cha, RN Phone Number: 03/28/2021, 8:32 AM    Transition of Care Department [facility]) has reviewed patient and no TOC needs have been identified at this time. We will continue to monitor patient advancement through interdisciplinary progression rounds. If new patient transition needs arise, please place a TOC consult.    Expected Discharge Plan: Home/Self Care Barriers to Discharge: Continued Medical Work up   Patient Goals and CMS Choice Patient states their goals for this hospitalization and ongoing recovery are:: to go home CMS Medicare.gov Compare Post Acute Care list provided to:: Patient Choice offered to / list presented to : Patient  Expected Discharge Plan and Services Expected Discharge Plan: Home/Self Care   Discharge Planning Services: CM Consult   Living arrangements for the past 2 months: Apartment                                      Prior Living Arrangements/Services Living arrangements for the past 2 months: Apartment Lives with:: Spouse Patient language and need for interpreter reviewed:: Yes Do you feel safe going back to the place where you live?: Yes            Criminal Activity/Legal Involvement Pertinent to Current Situation/Hospitalization: No - Comment as needed  Activities of Daily Living Home Assistive Devices/Equipment: None ADL Screening (condition at time of admission) Patient's cognitive ability adequate to safely complete daily activities?: Yes Is  the patient deaf or have difficulty hearing?: No Does the patient have difficulty seeing, even when wearing glasses/contacts?: No Does the patient have difficulty concentrating, remembering, or making decisions?: No Patient able to express need for assistance with ADLs?: Yes Does the patient have difficulty dressing or bathing?: No Independently performs ADLs?: Yes (appropriate for developmental age) Does the patient have difficulty walking or climbing stairs?: No Weakness of Legs: None Weakness of Arms/Hands: None  Permission Sought/Granted                  Emotional Assessment Appearance:: Appears stated age Attitude/Demeanor/Rapport: Engaged Affect (typically observed): Calm Orientation: : Oriented to Self, Oriented to Place, Oriented to  Time, Oriented to Situation Alcohol / Substance Use: Not Applicable Psych Involvement: No (comment)  Admission diagnosis:  Hepatocellular carcinoma ([place]) [C22.0] Patient Active Problem List   Diagnosis Date Noted   Hepatocellular carcinoma ([place]) 02/12/2021   Chronic pain 02/12/2021   Hepatitis C virus infection without hepatic coma 06/06/2019   Hyperglycemia 05/24/2019   Erectile dysfunction 05/23/2019   Chronic sinusitis 04/21/2019   Dyslipidemia 01/20/2019   Frontal headache 01/18/2019   Insomnia 01/18/2019   PCP:  [name], MD Pharmacy:   [facility] [place], [place] - [address] AT [facility] OF [address] [place] [address] Phone: [phone] Fax: [phone]  [facility]  [id number] [place]  [address] [place] [address] Phone: [phone] Fax: [place] [address] Phone: [phone] Fax: [phone]  [facility] [place] [place] [address] Phone: [phone] Fax:  [phone]     Social Determinants of Health (SDOH) Interventions    Readmission Risk Interventions No flowsheet data found.

## 2021-03-28 NOTE — Discharge Summary (Signed)
° ° °  Patient ID: Franklin Rivera MRN: [id number] DOB/AGE: 69-Jul-1952 69 y.o.  Admit date: 03/27/2021 Discharge date: 03/28/2021  Supervising Physician: Aletta Edouard  Patient Status: [facility] - In-pt  Admission Diagnoses: Hepatocellular carcinoma s/p DEB-TACE  Discharge Diagnoses:  Principal Problem:   Hepatocellular carcinoma [facility])   Discharged Condition: stable  Hospital Course: Franklin Rivera presented to IR for planned chemoembolization of hepatic mass.  The procedure was uncomplicated.  He was hypertensive throughout procedure and after, reported chest tightness.  Tests did not reveal a cause of this discomfort.  The hospitalist service was consulted and did not have additional recommendations.  No overnight events.  He denies any pain today.  He has successfully voided and eaten.  Blood pressure remains elevated and sodium is at 130.  His femoral access site is not bleeding and there is no hematoma.  He will be scheduled to follow up in 3-4 weeks  Consults:  Hospitalist  Significant Diagnostic Studies: procedural imaging can be referenced  Treatments: chemoembolization of hepatic mass  Discharge Exam: Blood pressure (!) 166/91, pulse 64, temperature 98 F (36.7 C), temperature source Oral, resp. rate 20, height 6' (1.829 m), weight 195 lb (88.5 kg), SpO2 97 %.  Physical Exam:  In no apparent distress.Femoral access site is not bleeding, no hematoma, dressing is c/d/I.  Abdomen is soft and non-tender.     Disposition:  D/c home with wife    Follow-up Information     Franklin Barrack, MD. Call today.   Specialty: Family Medicine Why: Follow up appointment needed for blood pressure management and electrolyte levels checked. Contact information: [place] [address] [phone]                  Electronically Signed: Pasty Spillers, PA 03/28/2021, 1:21 PM   I have spent Greater Than 30 Minutes discharging Franklin Rivera.

## 2021-04-04 ENCOUNTER — Ambulatory Visit (INDEPENDENT_AMBULATORY_CARE_PROVIDER_SITE_OTHER): Payer: Medicare HMO | Admitting: Family Medicine

## 2021-04-04 ENCOUNTER — Other Ambulatory Visit: Payer: Self-pay

## 2021-04-04 ENCOUNTER — Encounter: Payer: Self-pay | Admitting: Family Medicine

## 2021-04-04 VITALS — BP 124/88 | HR 91 | Temp 98.1°F | Ht 72.0 in | Wt 188.0 lb

## 2021-04-04 DIAGNOSIS — R030 Elevated blood-pressure reading, without diagnosis of hypertension: Secondary | ICD-10-CM | POA: Diagnosis not present

## 2021-04-04 DIAGNOSIS — C220 Liver cell carcinoma: Secondary | ICD-10-CM | POA: Diagnosis not present

## 2021-04-04 NOTE — Assessment & Plan Note (Addendum)
At goal today.  Has previously been well controlled.  Could have been slightly elevated due to recent over-the-counter medication for cold.  We will continue with home monitoring for now goal 140/90 or lower.  Do not think he needs antihypertensive at this point.  We will take losartan off medication list.

## 2021-04-04 NOTE — Progress Notes (Signed)
° °  Franklin Rivera is a 71 y.o. male who presents today for an office visit.  Assessment/Plan:  Problems Addressed Today: Hepatocellular carcinoma ([place]) Also with oncology.  Had embolization about a week ago with IR.  He tolerated well.  He has had a bit of night sweats for the last week or so which could be related to his recent embolization.  Otherwise is doing well.  We will continue with watchful waiting.  He will follow-up with oncology and IR prescription Franklin Rivera.  Elevated blood pressure reading At goal today.  Has previously been well controlled.  Could have been slightly elevated due to recent over-the-counter medication for cold.  We will continue with home monitoring for now goal 140/90 or lower.  Do not think he needs antihypertensive at this point.  We will take losartan off medication list.     Subjective:  HPI:  He is here to follow up from surgery. He was found to have liver mass and subsequent work-up showed hepatocellular carcinoma about 4 months ago. Had severe abdominal pain at that time. He underwent chemoembolization for his hepatic mass on 03/27/2021. He was experiencing chest pain after the surgery. However, symptoms were resolved in the hospital. He was tested positive for Covid as well. His blood pressure was elevated in the hospital. Medical team was consulted. He was started on losartan 25mg  daily. Had work-up which was unremarkable. EKG was normal. He was admitted for overnight observation and then was discharged on 03/28/2021.   He has been doing well since surgery. His blood pressure at office was normal today. He notes his appetite has been better. He denies any pain today. No chest pain at this time. He is following up with his doctor next week. No body aches. Denies headaches.   He have some issue with night sweats. This started about a week ago. He notes this happens whenever he try to go to sleep. No fever, chills or nausea.        Objective:  Physical  Exam: BP 124/88    Pulse 91    Temp 98.1 F (36.7 C) (Temporal)    Ht 6' (1.829 m)    Wt 188 lb (85.3 kg)    SpO2 98%    BMI 25.50 kg/m   Gen: No acute distress, resting comfortably CV: Regular rate and rhythm with no murmurs appreciated Pulm: Normal work of breathing, clear to auscultation bilaterally with no crackles, wheezes, or rhonchi Neuro: Grossly normal, moves all extremities Psych: Normal affect and thought content       I,Franklin Rivera,acting as a scribe for Dimas Chyle, MD.,have documented all relevant documentation on the behalf of Dimas Chyle, MD,as directed by  Dimas Chyle, MD while in the presence of Dimas Chyle, MD.   I, Dimas Chyle, MD, have reviewed all documentation for this visit. The documentation on 04/04/21 for the exam, diagnosis, procedures, and orders are all accurate and complete.  Franklin Rivera. Jerline Pain, MD 04/04/2021 2:28 PM

## 2021-04-04 NOTE — Patient Instructions (Signed)
It was very nice to see you today!  Your blood pressure looks great today.  I do not think we need to make any changes today.  Please let us know if you do not continue to have improvement or if your blood pressure becomes persistently elevated.  Take care, Dr Jerline Pain  PLEASE NOTE:  If you had any lab tests please let us know if you have not heard back within a few days. You may see your results on mychart before we have a chance to review them but we will give you a call once they are reviewed by [place]. If we ordered any referrals today, please let us know if you have not heard from their office within the next week.   Please try these tips to maintain a healthy lifestyle:  Eat at least 3 REAL meals and 1-2 snacks per day.  Aim for no more than 5 hours between eating.  If you eat breakfast, please do so within one hour of getting up.   Each meal should contain half fruits/vegetables, one quarter protein, and one quarter carbs (no bigger than a computer mouse)  Cut down on sweet beverages. This includes juice, soda, and sweet tea.   Drink at least 1 glass of water with each meal and aim for at least 8 glasses per day  Exercise at least 150 minutes every week.

## 2021-04-04 NOTE — Assessment & Plan Note (Signed)
Also with oncology.  Had embolization about a week ago with IR.  He tolerated well.  He has had a bit of night sweats for the last week or so which could be related to his recent embolization.  Otherwise is doing well.  We will continue with watchful waiting.  He will follow-up with oncology and IR prescription [place].

## 2021-04-05 ENCOUNTER — Telehealth: Payer: Self-pay | Admitting: Family Medicine

## 2021-04-05 NOTE — Telephone Encounter (Signed)
Patient called stating that he missed a call possibly from our office. I advised him that I would send a message to the medical staff to inquire if they called him or not.     [facility].

## 2021-04-09 DIAGNOSIS — C220 Liver cell carcinoma: Secondary | ICD-10-CM | POA: Diagnosis not present

## 2021-04-09 DIAGNOSIS — M79671 Pain in right foot: Secondary | ICD-10-CM | POA: Diagnosis not present

## 2021-04-09 DIAGNOSIS — Z6826 Body mass index (BMI) 26.0-26.9, adult: Secondary | ICD-10-CM | POA: Diagnosis not present

## 2021-04-09 DIAGNOSIS — I10 Essential (primary) hypertension: Secondary | ICD-10-CM | POA: Diagnosis not present

## 2021-04-09 DIAGNOSIS — R7401 Elevation of levels of liver transaminase levels: Secondary | ICD-10-CM | POA: Diagnosis not present

## 2021-04-09 DIAGNOSIS — Z79899 Other long term (current) drug therapy: Secondary | ICD-10-CM | POA: Diagnosis not present

## 2021-04-16 ENCOUNTER — Other Ambulatory Visit: Payer: Self-pay

## 2021-04-16 ENCOUNTER — Ambulatory Visit
Admission: RE | Admit: 2021-04-16 | Discharge: 2021-04-16 | Disposition: A | Discharge status: Home / Self Care | Payer: Medicare HMO | Source: Ambulatory Visit | Attending: Physician Assistant | Admitting: Physician Assistant

## 2021-04-16 ENCOUNTER — Encounter: Payer: Self-pay | Admitting: *Deleted

## 2021-04-16 DIAGNOSIS — Z9889 Other specified postprocedural states: Secondary | ICD-10-CM | POA: Diagnosis not present

## 2021-04-16 DIAGNOSIS — C220 Liver cell carcinoma: Secondary | ICD-10-CM | POA: Diagnosis not present

## 2021-04-16 HISTORY — PX: IR RADIOLOGIST EVAL & MGMT: IMG5224

## 2021-04-16 NOTE — Progress Notes (Signed)
Chief Complaint: Patient was consulted remotely today (TeleHealth) for follow-up after chemoembolization of the liver.  History of Present Illness: Franklin Rivera is a 71 y.o. male with a large hepatocellular carcinoma of the right lobe of the liver who is status post initial transcatheter arterial chemoembolization via supplying right hepatic arterial branches on 03/27/2021 consisting of administration of most of 2 vials of 100-300 m sized LC beads each loaded with 75 mg of doxorubicin.  After the procedure, he had no significant pain.  He did have some nausea for approximately one and a half weeks with decrease in appetite.  His appetite is now back to normal.  He did lose sense of taste which he attributes to his recent COVID diagnosis and his sense of taste has now returned to normal.  He has no current symptoms or complaints.  Past Medical History:  Diagnosis Date   Allergy    Headache    Hyperlipidemia     Past Surgical History:  Procedure Laterality Date   BLADDER SURGERY     IR 3D INDEPENDENT WKST  03/27/2021   IR ANGIOGRAM SELECTIVE EACH ADDITIONAL VESSEL  03/27/2021   IR ANGIOGRAM SELECTIVE EACH ADDITIONAL VESSEL  03/27/2021   IR ANGIOGRAM SELECTIVE EACH ADDITIONAL VESSEL  03/27/2021   IR ANGIOGRAM SELECTIVE EACH ADDITIONAL VESSEL  03/27/2021   IR ANGIOGRAM SELECTIVE EACH ADDITIONAL VESSEL  03/27/2021   IR ANGIOGRAM SELECTIVE EACH ADDITIONAL VESSEL  03/27/2021   IR ANGIOGRAM VISCERAL SELECTIVE  03/27/2021   IR EMBO TUMOR ORGAN ISCHEMIA INFARCT INC GUIDE ROADMAPPING  03/27/2021   IR RADIOLOGIST EVAL & MGMT  03/07/2021   IR RADIOLOGIST EVAL & MGMT  04/16/2021   IR US GUIDANCE  03/27/2021   NASAL FRACTURE SURGERY     Skull Surgery      Allergies: Patient has no known allergies.  Medications: Prior to Admission medications   Medication Sig Start Date End Date Taking? Authorizing Provider  cetirizine (ZYRTEC) 10 MG tablet Take 1 tablet (10 mg total) by mouth daily.  01/08/21   [name], MD  HYDROcodone-acetaminophen (NORCO) 7.5-325 MG tablet Take 1 tablet by mouth as directed. 0.5-1 tablet(s) by mouth 1-3 times daily 03/12/21   [provider]  hydrOXYzine (ATARAX/VISTARIL) 50 MG tablet Take 1 tablet (50 mg total) by mouth at bedtime as needed (sleep). 01/08/21   [name], MD  tadalafil (CIALIS) 10 MG tablet Take 1 tablet (10 mg total) by mouth daily as needed for erectile dysfunction. 05/25/20   [name], MD     Family History  Problem Relation Age of Onset   Cancer Neg Hx     Social History   Socioeconomic History   Marital status: Married    Spouse name: Not on file   Number of children: Not on file   Years of education: Not on file   Highest education level: Not on file  Occupational History   Not on file  Tobacco Use   Smoking status: Never   Smokeless tobacco: Never  Vaping Use   Vaping Use: Never used  Substance and Sexual Activity   Alcohol use: Never   Drug use: Not Currently   Sexual activity: Not on file  Other Topics Concern   Not on file  Social History Narrative   Not on file   Social Determinants of Health   Financial Resource Strain: Low Risk    Difficulty of Paying Living Expenses: Not hard at all  Food Insecurity: No  Food Insecurity   Worried About Charity fundraiser in the Last Year: Never true   Ran Out of Food in the Last Year: Never true  Transportation Needs: No Transportation Needs   Lack of Transportation (Medical): No   Lack of Transportation (Non-Medical): No  Physical Activity: Inactive   Days of Exercise per Week: 0 days   Minutes of Exercise per Session: 0 min  Stress: No Stress Concern Present   Feeling of Stress : Not at all  Social Connections: Moderately Isolated   Frequency of Communication with Friends and Family: More than three times a week   Frequency of Social Gatherings with Friends and Family: More than three times a week   Attends Religious Services:  Never   Marine scientist or Organizations: No   Attends Archivist Meetings: Never   Marital Status: Married    ECOG Status: 0 - Asymptomatic  Review of Systems  Constitutional: Negative.   Respiratory: Negative.    Cardiovascular: Negative.   Gastrointestinal: Negative.   Genitourinary: Negative.   Musculoskeletal: Negative.   Neurological: Negative.    Review of Systems: A 12 point ROS discussed and pertinent positives are indicated in the HPI above.  All other systems are negative.  Physical Exam No direct physical exam was performed (except for noted visual exam findings with Video Visits).   Vital Signs: There were no vitals taken for this visit.  Imaging: IR Angiogram Visceral Selective  Result Date: 03/27/2021 INDICATION: Hepatocellular carcinoma of the right lobe of the liver. The patient is presenting for transcatheter chemoembolization with drug-eluting beads. EXAM: 1. ULTRASOUND GUIDANCE FOR VASCULAR ACCESS OF THE RIGHT COMMON FEMORAL ARTERY 2. SELECTIVE VISCERAL ARTERIOGRAPHY AT THE LEVEL OF THE COMMON HEPATIC ARTERY 3. THREE-DIMENSIONAL RECONSTRUCTION OF ROTATIONAL COMMON HEPATIC ARTERIOGRAPHY 4. ADDITIONAL SELECTIVE RIGHT HEPATIC ARTERIOGRAPHY 5. ADDITIONAL SELECTIVE ARTERIOGRAPHY OF SUPERIOR RIGHT HEPATIC ARTERY BRANCH 6. ADDITIONAL SELECTIVE ARTERIOGRAPHY OF ADDITIONAL SUPERIOR RIGHT HEPATIC ARTERY BRANCH 7. ADDITIONAL SELECTIVE ARTERIOGRAPHY OF MID RIGHT HEPATIC ARTERIAL BRANCH 8. ADDITIONAL SELECTIVE ARTERIOGRAPHY OF MID RIGHT HEPATIC ARTERIAL BRANCH 9. ADDITIONAL SELECTIVE ARTERIOGRAPHY OF INFERIOR RIGHT HEPATIC ARTERIAL BRANCH 10. TRANSCATHETER EMBOLIZATION OF RIGHT LOBE HEPATOCELLULAR CARCINOMA MEDICATIONS: 3.375 g IV Zosyn, 8 mg IV Decadron, 4 mg IV Zofran and 20 mg IV hydralazine ANESTHESIA/SEDATION: Moderate (conscious) sedation was employed during this procedure. A total of Versed 6.0 mg and Fentanyl 300 mcg was administered intravenously by the  radiology nurse. Total intra-service moderate Sedation Time: 175 minutes. The patient's level of consciousness and vital signs were monitored continuously by radiology nursing throughout the procedure under my direct supervision. CONTRAST:  171 mL Omnipaque 300 FLUOROSCOPY TIME:  Fluoroscopy Time: 58 minutes and 42 seconds. 2750 mGy. COMPLICATIONS: None immediate. PROCEDURE: Informed consent was obtained from the patient following explanation of the procedure, risks, benefits and alternatives. The patient understands, agrees and consents for the procedure. All questions were addressed. A time out was performed prior to the initiation of the procedure. Maximal barrier sterile technique utilized including caps, mask, sterile gowns, sterile gloves, large sterile drape, hand hygiene, and chlorhexidine prep. Local anesthesia was provided with 1% lidocaine. The day prior to the procedure, drug-eluting beads were prepared by the Pharmacy with 2 separate vials 100-300 micron sized LC Beads, each loaded with 75 mg of doxorubicin. Ultrasound was used to confirm patency of the right common femoral artery. A permanent ultrasound image was recorded. The right common femoral artery was accessed utilizing a 21 gauge needle micropuncture set. After access,  a 5 French sheath was placed. A 5 French Cobra catheter was advanced into the abdominal aorta and used to selectively catheterize the celiac axis. The catheter was further advanced over a guidewire into the common hepatic artery and selective arteriography performed at the level of the common hepatic artery. The catheter was advanced just beyond the gastroduodenal artery into the proper hepatic artery and rotational arteriography performed centered over the liver. Three-dimensional processing of rotation arteriography was performed on a workstation and used for 3 dimensional mapping and additional selective arteriography within the right lobe of the liver. The 5 French catheter  was then used to selectively catheterize the right hepatic artery. Selective arteriography was performed of the right hepatic artery. A microcatheter was then advanced through the 5 French catheter and used to selectively catheterize a superior right hepatic artery branch. Selective arteriography was performed. The catheter was then used to further selectively catheterize an additional superior right hepatic arterial branch and selective arteriography performed. Additional selective arteriography was then performed at the level of the initial catheterized superior right hepatic artery branch and selective arteriography performed. Chemoembolization was performed at the level of the superior right hepatic artery branch with installation of 1 vial of doxorubicin loaded beads. The beads were diluted in contrast material and slowly instilled under fluoroscopic guidance. Additional arteriography was performed to assess flow. The microcatheter was retracted then used to selectively catheterize an additional right hepatic artery branch within the midportion of the right lobe of the liver. Selective arteriography was performed. A second mid right hepatic arterial branch was also selectively catheterized and selective arteriography performed. Chemoembolization was then performed at the level of this branch with installation of part of a vial of loaded beads. Additional arteriography was performed. The microcatheter was removed and additional arteriography performed through the 5 French catheter at the level of the right hepatic artery in order to assess residual blood supply to the tumor. The microcatheter was then reintroduced via the 5 French catheter and used to selectively catheterize an additional inferior right hepatic artery branch. Selective arteriography was performed through the microcatheter. Additional chemoembolization was then performed at the level the inferior right hepatic artery branch via the microcatheter  with installation of drug-eluting beads. Additional arteriography was performed. Catheters were removed. Contrast injection was performed in an oblique projection at the level of the right groin in order to assess level of puncture of the right common femoral artery. Arteriotomy closure was then performed with the Cordis ExoSeal device. FINDINGS: Initial arteriography demonstrates numerous irregular arterial branches supplying hypervascular tumor within the right lobe of the liver extending throughout much of the right lobe and having an oval shape. Reconstructed 3D imaging of arterial blood supply from rotational arteriography demonstrates clear enlargement of the mass since prior imaging with estimated dimensions of approximately 11 x 8.5 x 10 cm compared to approximately 8.2 x 6.2 x 8 cm by CT in November. There was ability to initially characterize and catheterize superior blood supply to the large tumor via a branch of the right hepatic artery. Selective arteriography demonstrates tumor blush along the superior aspect of the mass. During treatment of this branch with installation of drug-eluting beads there was also reflux of contrast into another branch which was also clearly supplying tumor resulting in further staining of the tumor with treatment. There appear to be approximately 1/3 of the tumor volume able to be treated with the initial installation of particles. This distribution did accommodate essentially 1 vial of the drug-eluting  beads. Additional catheterization was then performed at the level of a mid right hepatic arterial branch with a descending course that is clearly supplying a portion of the central aspect of the tumor. This supply was able to be treated effectively with a portion of a vial of the drug-eluting particles. Additional catheterization was then performed of a medial descending right hepatic arterial branch that supplies the inferior aspect of the tumor. This branch was able to be  effectively treated with installation of part of a vial of drug-eluting particles. After completion of treatment, there was approximately 1/4 or slightly less volume of a vial of particles left over which were not administered. IMPRESSION: Transcatheter chemoembolization of large right lobe hepatocellular carcinoma as described in detail above which included selective catheterization and transcatheter embolization within 3 vascular territories supplying the tumor. Drug-eluting LC beads loaded with doxorubicin were utilized for the treatment with nearly all of 2 vials loaded with a total of 150 mg of doxorubicin utilized. No was made on the 3 dimensional reconstruction of rotational arteriography of significant enlargement of the tumor since prior CT in November with maximum height of 11 cm today compared to approximately 8 cm previously. The patient will be observed overnight to treat any post embolic symptoms. Electronically Signed   By: Aletta Edouard M.D.   On: 03/27/2021 16:00   IR Angiogram Selective Each Additional Vessel  Result Date: 03/27/2021 INDICATION: Hepatocellular carcinoma of the right lobe of the liver. The patient is presenting for transcatheter chemoembolization with drug-eluting beads. EXAM: 1. ULTRASOUND GUIDANCE FOR VASCULAR ACCESS OF THE RIGHT COMMON FEMORAL ARTERY 2. SELECTIVE VISCERAL ARTERIOGRAPHY AT THE LEVEL OF THE COMMON HEPATIC ARTERY 3. THREE-DIMENSIONAL RECONSTRUCTION OF ROTATIONAL COMMON HEPATIC ARTERIOGRAPHY 4. ADDITIONAL SELECTIVE RIGHT HEPATIC ARTERIOGRAPHY 5. ADDITIONAL SELECTIVE ARTERIOGRAPHY OF SUPERIOR RIGHT HEPATIC ARTERY BRANCH 6. ADDITIONAL SELECTIVE ARTERIOGRAPHY OF ADDITIONAL SUPERIOR RIGHT HEPATIC ARTERY BRANCH 7. ADDITIONAL SELECTIVE ARTERIOGRAPHY OF MID RIGHT HEPATIC ARTERIAL BRANCH 8. ADDITIONAL SELECTIVE ARTERIOGRAPHY OF MID RIGHT HEPATIC ARTERIAL BRANCH 9. ADDITIONAL SELECTIVE ARTERIOGRAPHY OF INFERIOR RIGHT HEPATIC ARTERIAL BRANCH 10. TRANSCATHETER  EMBOLIZATION OF RIGHT LOBE HEPATOCELLULAR CARCINOMA MEDICATIONS: 3.375 g IV Zosyn, 8 mg IV Decadron, 4 mg IV Zofran and 20 mg IV hydralazine ANESTHESIA/SEDATION: Moderate (conscious) sedation was employed during this procedure. A total of Versed 6.0 mg and Fentanyl 300 mcg was administered intravenously by the radiology nurse. Total intra-service moderate Sedation Time: 175 minutes. The patient's level of consciousness and vital signs were monitored continuously by radiology nursing throughout the procedure under my direct supervision. CONTRAST:  171 mL Omnipaque 300 FLUOROSCOPY TIME:  Fluoroscopy Time: 58 minutes and 42 seconds. 2750 mGy. COMPLICATIONS: None immediate. PROCEDURE: Informed consent was obtained from the patient following explanation of the procedure, risks, benefits and alternatives. The patient understands, agrees and consents for the procedure. All questions were addressed. A time out was performed prior to the initiation of the procedure. Maximal barrier sterile technique utilized including caps, mask, sterile gowns, sterile gloves, large sterile drape, hand hygiene, and chlorhexidine prep. Local anesthesia was provided with 1% lidocaine. The day prior to the procedure, drug-eluting beads were prepared by the Pharmacy with 2 separate vials 100-300 micron sized LC Beads, each loaded with 75 mg of doxorubicin. Ultrasound was used to confirm patency of the right common femoral artery. A permanent ultrasound image was recorded. The right common femoral artery was accessed utilizing a 21 gauge needle micropuncture set. After access, a 5 French sheath was placed. A 5 French Cobra catheter was advanced into  the abdominal aorta and used to selectively catheterize the celiac axis. The catheter was further advanced over a guidewire into the common hepatic artery and selective arteriography performed at the level of the common hepatic artery. The catheter was advanced just beyond the gastroduodenal artery  into the proper hepatic artery and rotational arteriography performed centered over the liver. Three-dimensional processing of rotation arteriography was performed on a workstation and used for 3 dimensional mapping and additional selective arteriography within the right lobe of the liver. The 5 French catheter was then used to selectively catheterize the right hepatic artery. Selective arteriography was performed of the right hepatic artery. A microcatheter was then advanced through the 5 French catheter and used to selectively catheterize a superior right hepatic artery branch. Selective arteriography was performed. The catheter was then used to further selectively catheterize an additional superior right hepatic arterial branch and selective arteriography performed. Additional selective arteriography was then performed at the level of the initial catheterized superior right hepatic artery branch and selective arteriography performed. Chemoembolization was performed at the level of the superior right hepatic artery branch with installation of 1 vial of doxorubicin loaded beads. The beads were diluted in contrast material and slowly instilled under fluoroscopic guidance. Additional arteriography was performed to assess flow. The microcatheter was retracted then used to selectively catheterize an additional right hepatic artery branch within the midportion of the right lobe of the liver. Selective arteriography was performed. A second mid right hepatic arterial branch was also selectively catheterized and selective arteriography performed. Chemoembolization was then performed at the level of this branch with installation of part of a vial of loaded beads. Additional arteriography was performed. The microcatheter was removed and additional arteriography performed through the 5 French catheter at the level of the right hepatic artery in order to assess residual blood supply to the tumor. The microcatheter was then  reintroduced via the 5 French catheter and used to selectively catheterize an additional inferior right hepatic artery branch. Selective arteriography was performed through the microcatheter. Additional chemoembolization was then performed at the level the inferior right hepatic artery branch via the microcatheter with installation of drug-eluting beads. Additional arteriography was performed. Catheters were removed. Contrast injection was performed in an oblique projection at the level of the right groin in order to assess level of puncture of the right common femoral artery. Arteriotomy closure was then performed with the Cordis ExoSeal device. FINDINGS: Initial arteriography demonstrates numerous irregular arterial branches supplying hypervascular tumor within the right lobe of the liver extending throughout much of the right lobe and having an oval shape. Reconstructed 3D imaging of arterial blood supply from rotational arteriography demonstrates clear enlargement of the mass since prior imaging with estimated dimensions of approximately 11 x 8.5 x 10 cm compared to approximately 8.2 x 6.2 x 8 cm by CT in November. There was ability to initially characterize and catheterize superior blood supply to the large tumor via a branch of the right hepatic artery. Selective arteriography demonstrates tumor blush along the superior aspect of the mass. During treatment of this branch with installation of drug-eluting beads there was also reflux of contrast into another branch which was also clearly supplying tumor resulting in further staining of the tumor with treatment. There appear to be approximately 1/3 of the tumor volume able to be treated with the initial installation of particles. This distribution did accommodate essentially 1 vial of the drug-eluting beads. Additional catheterization was then performed at the level of a mid right hepatic  arterial branch with a descending course that is clearly supplying a  portion of the central aspect of the tumor. This supply was able to be treated effectively with a portion of a vial of the drug-eluting particles. Additional catheterization was then performed of a medial descending right hepatic arterial branch that supplies the inferior aspect of the tumor. This branch was able to be effectively treated with installation of part of a vial of drug-eluting particles. After completion of treatment, there was approximately 1/4 or slightly less volume of a vial of particles left over which were not administered. IMPRESSION: Transcatheter chemoembolization of large right lobe hepatocellular carcinoma as described in detail above which included selective catheterization and transcatheter embolization within 3 vascular territories supplying the tumor. Drug-eluting LC beads loaded with doxorubicin were utilized for the treatment with nearly all of 2 vials loaded with a total of 150 mg of doxorubicin utilized. No was made on the 3 dimensional reconstruction of rotational arteriography of significant enlargement of the tumor since prior CT in November with maximum height of 11 cm today compared to approximately 8 cm previously. The patient will be observed overnight to treat any post embolic symptoms. Electronically Signed   By: Aletta Edouard M.D.   On: 03/27/2021 16:00   IR Angiogram Selective Each Additional Vessel  Result Date: 03/27/2021 INDICATION: Hepatocellular carcinoma of the right lobe of the liver. The patient is presenting for transcatheter chemoembolization with drug-eluting beads. EXAM: 1. ULTRASOUND GUIDANCE FOR VASCULAR ACCESS OF THE RIGHT COMMON FEMORAL ARTERY 2. SELECTIVE VISCERAL ARTERIOGRAPHY AT THE LEVEL OF THE COMMON HEPATIC ARTERY 3. THREE-DIMENSIONAL RECONSTRUCTION OF ROTATIONAL COMMON HEPATIC ARTERIOGRAPHY 4. ADDITIONAL SELECTIVE RIGHT HEPATIC ARTERIOGRAPHY 5. ADDITIONAL SELECTIVE ARTERIOGRAPHY OF SUPERIOR RIGHT HEPATIC ARTERY BRANCH 6. ADDITIONAL SELECTIVE  ARTERIOGRAPHY OF ADDITIONAL SUPERIOR RIGHT HEPATIC ARTERY BRANCH 7. ADDITIONAL SELECTIVE ARTERIOGRAPHY OF MID RIGHT HEPATIC ARTERIAL BRANCH 8. ADDITIONAL SELECTIVE ARTERIOGRAPHY OF MID RIGHT HEPATIC ARTERIAL BRANCH 9. ADDITIONAL SELECTIVE ARTERIOGRAPHY OF INFERIOR RIGHT HEPATIC ARTERIAL BRANCH 10. TRANSCATHETER EMBOLIZATION OF RIGHT LOBE HEPATOCELLULAR CARCINOMA MEDICATIONS: 3.375 g IV Zosyn, 8 mg IV Decadron, 4 mg IV Zofran and 20 mg IV hydralazine ANESTHESIA/SEDATION: Moderate (conscious) sedation was employed during this procedure. A total of Versed 6.0 mg and Fentanyl 300 mcg was administered intravenously by the radiology nurse. Total intra-service moderate Sedation Time: 175 minutes. The patient's level of consciousness and vital signs were monitored continuously by radiology nursing throughout the procedure under my direct supervision. CONTRAST:  171 mL Omnipaque 300 FLUOROSCOPY TIME:  Fluoroscopy Time: 58 minutes and 42 seconds. 2750 mGy. COMPLICATIONS: None immediate. PROCEDURE: Informed consent was obtained from the patient following explanation of the procedure, risks, benefits and alternatives. The patient understands, agrees and consents for the procedure. All questions were addressed. A time out was performed prior to the initiation of the procedure. Maximal barrier sterile technique utilized including caps, mask, sterile gowns, sterile gloves, large sterile drape, hand hygiene, and chlorhexidine prep. Local anesthesia was provided with 1% lidocaine. The day prior to the procedure, drug-eluting beads were prepared by the Pharmacy with 2 separate vials 100-300 micron sized LC Beads, each loaded with 75 mg of doxorubicin. Ultrasound was used to confirm patency of the right common femoral artery. A permanent ultrasound image was recorded. The right common femoral artery was accessed utilizing a 21 gauge needle micropuncture set. After access, a 5 French sheath was placed. A 5 French Cobra catheter was  advanced into the abdominal aorta and used to selectively catheterize the celiac axis. The catheter  was further advanced over a guidewire into the common hepatic artery and selective arteriography performed at the level of the common hepatic artery. The catheter was advanced just beyond the gastroduodenal artery into the proper hepatic artery and rotational arteriography performed centered over the liver. Three-dimensional processing of rotation arteriography was performed on a workstation and used for 3 dimensional mapping and additional selective arteriography within the right lobe of the liver. The 5 French catheter was then used to selectively catheterize the right hepatic artery. Selective arteriography was performed of the right hepatic artery. A microcatheter was then advanced through the 5 French catheter and used to selectively catheterize a superior right hepatic artery branch. Selective arteriography was performed. The catheter was then used to further selectively catheterize an additional superior right hepatic arterial branch and selective arteriography performed. Additional selective arteriography was then performed at the level of the initial catheterized superior right hepatic artery branch and selective arteriography performed. Chemoembolization was performed at the level of the superior right hepatic artery branch with installation of 1 vial of doxorubicin loaded beads. The beads were diluted in contrast material and slowly instilled under fluoroscopic guidance. Additional arteriography was performed to assess flow. The microcatheter was retracted then used to selectively catheterize an additional right hepatic artery branch within the midportion of the right lobe of the liver. Selective arteriography was performed. A second mid right hepatic arterial branch was also selectively catheterized and selective arteriography performed. Chemoembolization was then performed at the level of this branch with  installation of part of a vial of loaded beads. Additional arteriography was performed. The microcatheter was removed and additional arteriography performed through the 5 French catheter at the level of the right hepatic artery in order to assess residual blood supply to the tumor. The microcatheter was then reintroduced via the 5 French catheter and used to selectively catheterize an additional inferior right hepatic artery branch. Selective arteriography was performed through the microcatheter. Additional chemoembolization was then performed at the level the inferior right hepatic artery branch via the microcatheter with installation of drug-eluting beads. Additional arteriography was performed. Catheters were removed. Contrast injection was performed in an oblique projection at the level of the right groin in order to assess level of puncture of the right common femoral artery. Arteriotomy closure was then performed with the Cordis ExoSeal device. FINDINGS: Initial arteriography demonstrates numerous irregular arterial branches supplying hypervascular tumor within the right lobe of the liver extending throughout much of the right lobe and having an oval shape. Reconstructed 3D imaging of arterial blood supply from rotational arteriography demonstrates clear enlargement of the mass since prior imaging with estimated dimensions of approximately 11 x 8.5 x 10 cm compared to approximately 8.2 x 6.2 x 8 cm by CT in November. There was ability to initially characterize and catheterize superior blood supply to the large tumor via a branch of the right hepatic artery. Selective arteriography demonstrates tumor blush along the superior aspect of the mass. During treatment of this branch with installation of drug-eluting beads there was also reflux of contrast into another branch which was also clearly supplying tumor resulting in further staining of the tumor with treatment. There appear to be approximately 1/3 of the  tumor volume able to be treated with the initial installation of particles. This distribution did accommodate essentially 1 vial of the drug-eluting beads. Additional catheterization was then performed at the level of a mid right hepatic arterial branch with a descending course that is clearly supplying a portion of  the central aspect of the tumor. This supply was able to be treated effectively with a portion of a vial of the drug-eluting particles. Additional catheterization was then performed of a medial descending right hepatic arterial branch that supplies the inferior aspect of the tumor. This branch was able to be effectively treated with installation of part of a vial of drug-eluting particles. After completion of treatment, there was approximately 1/4 or slightly less volume of a vial of particles left over which were not administered. IMPRESSION: Transcatheter chemoembolization of large right lobe hepatocellular carcinoma as described in detail above which included selective catheterization and transcatheter embolization within 3 vascular territories supplying the tumor. Drug-eluting LC beads loaded with doxorubicin were utilized for the treatment with nearly all of 2 vials loaded with a total of 150 mg of doxorubicin utilized. No was made on the 3 dimensional reconstruction of rotational arteriography of significant enlargement of the tumor since prior CT in November with maximum height of 11 cm today compared to approximately 8 cm previously. The patient will be observed overnight to treat any post embolic symptoms. Electronically Signed   By: Aletta Edouard M.D.   On: 03/27/2021 16:00   IR Angiogram Selective Each Additional Vessel  Result Date: 03/27/2021 INDICATION: Hepatocellular carcinoma of the right lobe of the liver. The patient is presenting for transcatheter chemoembolization with drug-eluting beads. EXAM: 1. ULTRASOUND GUIDANCE FOR VASCULAR ACCESS OF THE RIGHT COMMON FEMORAL ARTERY 2.  SELECTIVE VISCERAL ARTERIOGRAPHY AT THE LEVEL OF THE COMMON HEPATIC ARTERY 3. THREE-DIMENSIONAL RECONSTRUCTION OF ROTATIONAL COMMON HEPATIC ARTERIOGRAPHY 4. ADDITIONAL SELECTIVE RIGHT HEPATIC ARTERIOGRAPHY 5. ADDITIONAL SELECTIVE ARTERIOGRAPHY OF SUPERIOR RIGHT HEPATIC ARTERY BRANCH 6. ADDITIONAL SELECTIVE ARTERIOGRAPHY OF ADDITIONAL SUPERIOR RIGHT HEPATIC ARTERY BRANCH 7. ADDITIONAL SELECTIVE ARTERIOGRAPHY OF MID RIGHT HEPATIC ARTERIAL BRANCH 8. ADDITIONAL SELECTIVE ARTERIOGRAPHY OF MID RIGHT HEPATIC ARTERIAL BRANCH 9. ADDITIONAL SELECTIVE ARTERIOGRAPHY OF INFERIOR RIGHT HEPATIC ARTERIAL BRANCH 10. TRANSCATHETER EMBOLIZATION OF RIGHT LOBE HEPATOCELLULAR CARCINOMA MEDICATIONS: 3.375 g IV Zosyn, 8 mg IV Decadron, 4 mg IV Zofran and 20 mg IV hydralazine ANESTHESIA/SEDATION: Moderate (conscious) sedation was employed during this procedure. A total of Versed 6.0 mg and Fentanyl 300 mcg was administered intravenously by the radiology nurse. Total intra-service moderate Sedation Time: 175 minutes. The patient's level of consciousness and vital signs were monitored continuously by radiology nursing throughout the procedure under my direct supervision. CONTRAST:  171 mL Omnipaque 300 FLUOROSCOPY TIME:  Fluoroscopy Time: 58 minutes and 42 seconds. 2750 mGy. COMPLICATIONS: None immediate. PROCEDURE: Informed consent was obtained from the patient following explanation of the procedure, risks, benefits and alternatives. The patient understands, agrees and consents for the procedure. All questions were addressed. A time out was performed prior to the initiation of the procedure. Maximal barrier sterile technique utilized including caps, mask, sterile gowns, sterile gloves, large sterile drape, hand hygiene, and chlorhexidine prep. Local anesthesia was provided with 1% lidocaine. The day prior to the procedure, drug-eluting beads were prepared by the Pharmacy with 2 separate vials 100-300 micron sized LC Beads, each loaded with  75 mg of doxorubicin. Ultrasound was used to confirm patency of the right common femoral artery. A permanent ultrasound image was recorded. The right common femoral artery was accessed utilizing a 21 gauge needle micropuncture set. After access, a 5 French sheath was placed. A 5 French Cobra catheter was advanced into the abdominal aorta and used to selectively catheterize the celiac axis. The catheter was further advanced over a guidewire into the common hepatic artery and selective arteriography  performed at the level of the common hepatic artery. The catheter was advanced just beyond the gastroduodenal artery into the proper hepatic artery and rotational arteriography performed centered over the liver. Three-dimensional processing of rotation arteriography was performed on a workstation and used for 3 dimensional mapping and additional selective arteriography within the right lobe of the liver. The 5 French catheter was then used to selectively catheterize the right hepatic artery. Selective arteriography was performed of the right hepatic artery. A microcatheter was then advanced through the 5 French catheter and used to selectively catheterize a superior right hepatic artery branch. Selective arteriography was performed. The catheter was then used to further selectively catheterize an additional superior right hepatic arterial branch and selective arteriography performed. Additional selective arteriography was then performed at the level of the initial catheterized superior right hepatic artery branch and selective arteriography performed. Chemoembolization was performed at the level of the superior right hepatic artery branch with installation of 1 vial of doxorubicin loaded beads. The beads were diluted in contrast material and slowly instilled under fluoroscopic guidance. Additional arteriography was performed to assess flow. The microcatheter was retracted then used to selectively catheterize an additional  right hepatic artery branch within the midportion of the right lobe of the liver. Selective arteriography was performed. A second mid right hepatic arterial branch was also selectively catheterized and selective arteriography performed. Chemoembolization was then performed at the level of this branch with installation of part of a vial of loaded beads. Additional arteriography was performed. The microcatheter was removed and additional arteriography performed through the 5 French catheter at the level of the right hepatic artery in order to assess residual blood supply to the tumor. The microcatheter was then reintroduced via the 5 French catheter and used to selectively catheterize an additional inferior right hepatic artery branch. Selective arteriography was performed through the microcatheter. Additional chemoembolization was then performed at the level the inferior right hepatic artery branch via the microcatheter with installation of drug-eluting beads. Additional arteriography was performed. Catheters were removed. Contrast injection was performed in an oblique projection at the level of the right groin in order to assess level of puncture of the right common femoral artery. Arteriotomy closure was then performed with the Cordis ExoSeal device. FINDINGS: Initial arteriography demonstrates numerous irregular arterial branches supplying hypervascular tumor within the right lobe of the liver extending throughout much of the right lobe and having an oval shape. Reconstructed 3D imaging of arterial blood supply from rotational arteriography demonstrates clear enlargement of the mass since prior imaging with estimated dimensions of approximately 11 x 8.5 x 10 cm compared to approximately 8.2 x 6.2 x 8 cm by CT in November. There was ability to initially characterize and catheterize superior blood supply to the large tumor via a branch of the right hepatic artery. Selective arteriography demonstrates tumor blush  along the superior aspect of the mass. During treatment of this branch with installation of drug-eluting beads there was also reflux of contrast into another branch which was also clearly supplying tumor resulting in further staining of the tumor with treatment. There appear to be approximately 1/3 of the tumor volume able to be treated with the initial installation of particles. This distribution did accommodate essentially 1 vial of the drug-eluting beads. Additional catheterization was then performed at the level of a mid right hepatic arterial branch with a descending course that is clearly supplying a portion of the central aspect of the tumor. This supply was able to be treated effectively  with a portion of a vial of the drug-eluting particles. Additional catheterization was then performed of a medial descending right hepatic arterial branch that supplies the inferior aspect of the tumor. This branch was able to be effectively treated with installation of part of a vial of drug-eluting particles. After completion of treatment, there was approximately 1/4 or slightly less volume of a vial of particles left over which were not administered. IMPRESSION: Transcatheter chemoembolization of large right lobe hepatocellular carcinoma as described in detail above which included selective catheterization and transcatheter embolization within 3 vascular territories supplying the tumor. Drug-eluting LC beads loaded with doxorubicin were utilized for the treatment with nearly all of 2 vials loaded with a total of 150 mg of doxorubicin utilized. No was made on the 3 dimensional reconstruction of rotational arteriography of significant enlargement of the tumor since prior CT in November with maximum height of 11 cm today compared to approximately 8 cm previously. The patient will be observed overnight to treat any post embolic symptoms. Electronically Signed   By: Aletta Edouard M.D.   On: 03/27/2021 16:00   IR Angiogram  Selective Each Additional Vessel  Result Date: 03/27/2021 INDICATION: Hepatocellular carcinoma of the right lobe of the liver. The patient is presenting for transcatheter chemoembolization with drug-eluting beads. EXAM: 1. ULTRASOUND GUIDANCE FOR VASCULAR ACCESS OF THE RIGHT COMMON FEMORAL ARTERY 2. SELECTIVE VISCERAL ARTERIOGRAPHY AT THE LEVEL OF THE COMMON HEPATIC ARTERY 3. THREE-DIMENSIONAL RECONSTRUCTION OF ROTATIONAL COMMON HEPATIC ARTERIOGRAPHY 4. ADDITIONAL SELECTIVE RIGHT HEPATIC ARTERIOGRAPHY 5. ADDITIONAL SELECTIVE ARTERIOGRAPHY OF SUPERIOR RIGHT HEPATIC ARTERY BRANCH 6. ADDITIONAL SELECTIVE ARTERIOGRAPHY OF ADDITIONAL SUPERIOR RIGHT HEPATIC ARTERY BRANCH 7. ADDITIONAL SELECTIVE ARTERIOGRAPHY OF MID RIGHT HEPATIC ARTERIAL BRANCH 8. ADDITIONAL SELECTIVE ARTERIOGRAPHY OF MID RIGHT HEPATIC ARTERIAL BRANCH 9. ADDITIONAL SELECTIVE ARTERIOGRAPHY OF INFERIOR RIGHT HEPATIC ARTERIAL BRANCH 10. TRANSCATHETER EMBOLIZATION OF RIGHT LOBE HEPATOCELLULAR CARCINOMA MEDICATIONS: 3.375 g IV Zosyn, 8 mg IV Decadron, 4 mg IV Zofran and 20 mg IV hydralazine ANESTHESIA/SEDATION: Moderate (conscious) sedation was employed during this procedure. A total of Versed 6.0 mg and Fentanyl 300 mcg was administered intravenously by the radiology nurse. Total intra-service moderate Sedation Time: 175 minutes. The patient's level of consciousness and vital signs were monitored continuously by radiology nursing throughout the procedure under my direct supervision. CONTRAST:  171 mL Omnipaque 300 FLUOROSCOPY TIME:  Fluoroscopy Time: 58 minutes and 42 seconds. 2750 mGy. COMPLICATIONS: None immediate. PROCEDURE: Informed consent was obtained from the patient following explanation of the procedure, risks, benefits and alternatives. The patient understands, agrees and consents for the procedure. All questions were addressed. A time out was performed prior to the initiation of the procedure. Maximal barrier sterile technique utilized including  caps, mask, sterile gowns, sterile gloves, large sterile drape, hand hygiene, and chlorhexidine prep. Local anesthesia was provided with 1% lidocaine. The day prior to the procedure, drug-eluting beads were prepared by the Pharmacy with 2 separate vials 100-300 micron sized LC Beads, each loaded with 75 mg of doxorubicin. Ultrasound was used to confirm patency of the right common femoral artery. A permanent ultrasound image was recorded. The right common femoral artery was accessed utilizing a 21 gauge needle micropuncture set. After access, a 5 French sheath was placed. A 5 French Cobra catheter was advanced into the abdominal aorta and used to selectively catheterize the celiac axis. The catheter was further advanced over a guidewire into the common hepatic artery and selective arteriography performed at the level of the common hepatic artery. The catheter was advanced just  beyond the gastroduodenal artery into the proper hepatic artery and rotational arteriography performed centered over the liver. Three-dimensional processing of rotation arteriography was performed on a workstation and used for 3 dimensional mapping and additional selective arteriography within the right lobe of the liver. The 5 French catheter was then used to selectively catheterize the right hepatic artery. Selective arteriography was performed of the right hepatic artery. A microcatheter was then advanced through the 5 French catheter and used to selectively catheterize a superior right hepatic artery branch. Selective arteriography was performed. The catheter was then used to further selectively catheterize an additional superior right hepatic arterial branch and selective arteriography performed. Additional selective arteriography was then performed at the level of the initial catheterized superior right hepatic artery branch and selective arteriography performed. Chemoembolization was performed at the level of the superior right hepatic  artery branch with installation of 1 vial of doxorubicin loaded beads. The beads were diluted in contrast material and slowly instilled under fluoroscopic guidance. Additional arteriography was performed to assess flow. The microcatheter was retracted then used to selectively catheterize an additional right hepatic artery branch within the midportion of the right lobe of the liver. Selective arteriography was performed. A second mid right hepatic arterial branch was also selectively catheterized and selective arteriography performed. Chemoembolization was then performed at the level of this branch with installation of part of a vial of loaded beads. Additional arteriography was performed. The microcatheter was removed and additional arteriography performed through the 5 French catheter at the level of the right hepatic artery in order to assess residual blood supply to the tumor. The microcatheter was then reintroduced via the 5 French catheter and used to selectively catheterize an additional inferior right hepatic artery branch. Selective arteriography was performed through the microcatheter. Additional chemoembolization was then performed at the level the inferior right hepatic artery branch via the microcatheter with installation of drug-eluting beads. Additional arteriography was performed. Catheters were removed. Contrast injection was performed in an oblique projection at the level of the right groin in order to assess level of puncture of the right common femoral artery. Arteriotomy closure was then performed with the Cordis ExoSeal device. FINDINGS: Initial arteriography demonstrates numerous irregular arterial branches supplying hypervascular tumor within the right lobe of the liver extending throughout much of the right lobe and having an oval shape. Reconstructed 3D imaging of arterial blood supply from rotational arteriography demonstrates clear enlargement of the mass since prior imaging with estimated  dimensions of approximately 11 x 8.5 x 10 cm compared to approximately 8.2 x 6.2 x 8 cm by CT in November. There was ability to initially characterize and catheterize superior blood supply to the large tumor via a branch of the right hepatic artery. Selective arteriography demonstrates tumor blush along the superior aspect of the mass. During treatment of this branch with installation of drug-eluting beads there was also reflux of contrast into another branch which was also clearly supplying tumor resulting in further staining of the tumor with treatment. There appear to be approximately 1/3 of the tumor volume able to be treated with the initial installation of particles. This distribution did accommodate essentially 1 vial of the drug-eluting beads. Additional catheterization was then performed at the level of a mid right hepatic arterial branch with a descending course that is clearly supplying a portion of the central aspect of the tumor. This supply was able to be treated effectively with a portion of a vial of the drug-eluting particles. Additional catheterization was then  performed of a medial descending right hepatic arterial branch that supplies the inferior aspect of the tumor. This branch was able to be effectively treated with installation of part of a vial of drug-eluting particles. After completion of treatment, there was approximately 1/4 or slightly less volume of a vial of particles left over which were not administered. IMPRESSION: Transcatheter chemoembolization of large right lobe hepatocellular carcinoma as described in detail above which included selective catheterization and transcatheter embolization within 3 vascular territories supplying the tumor. Drug-eluting LC beads loaded with doxorubicin were utilized for the treatment with nearly all of 2 vials loaded with a total of 150 mg of doxorubicin utilized. No was made on the 3 dimensional reconstruction of rotational arteriography of  significant enlargement of the tumor since prior CT in November with maximum height of 11 cm today compared to approximately 8 cm previously. The patient will be observed overnight to treat any post embolic symptoms. Electronically Signed   By: Aletta Edouard M.D.   On: 03/27/2021 16:00   IR Angiogram Selective Each Additional Vessel  Result Date: 03/27/2021 INDICATION: Hepatocellular carcinoma of the right lobe of the liver. The patient is presenting for transcatheter chemoembolization with drug-eluting beads. EXAM: 1. ULTRASOUND GUIDANCE FOR VASCULAR ACCESS OF THE RIGHT COMMON FEMORAL ARTERY 2. SELECTIVE VISCERAL ARTERIOGRAPHY AT THE LEVEL OF THE COMMON HEPATIC ARTERY 3. THREE-DIMENSIONAL RECONSTRUCTION OF ROTATIONAL COMMON HEPATIC ARTERIOGRAPHY 4. ADDITIONAL SELECTIVE RIGHT HEPATIC ARTERIOGRAPHY 5. ADDITIONAL SELECTIVE ARTERIOGRAPHY OF SUPERIOR RIGHT HEPATIC ARTERY BRANCH 6. ADDITIONAL SELECTIVE ARTERIOGRAPHY OF ADDITIONAL SUPERIOR RIGHT HEPATIC ARTERY BRANCH 7. ADDITIONAL SELECTIVE ARTERIOGRAPHY OF MID RIGHT HEPATIC ARTERIAL BRANCH 8. ADDITIONAL SELECTIVE ARTERIOGRAPHY OF MID RIGHT HEPATIC ARTERIAL BRANCH 9. ADDITIONAL SELECTIVE ARTERIOGRAPHY OF INFERIOR RIGHT HEPATIC ARTERIAL BRANCH 10. TRANSCATHETER EMBOLIZATION OF RIGHT LOBE HEPATOCELLULAR CARCINOMA MEDICATIONS: 3.375 g IV Zosyn, 8 mg IV Decadron, 4 mg IV Zofran and 20 mg IV hydralazine ANESTHESIA/SEDATION: Moderate (conscious) sedation was employed during this procedure. A total of Versed 6.0 mg and Fentanyl 300 mcg was administered intravenously by the radiology nurse. Total intra-service moderate Sedation Time: 175 minutes. The patient's level of consciousness and vital signs were monitored continuously by radiology nursing throughout the procedure under my direct supervision. CONTRAST:  171 mL Omnipaque 300 FLUOROSCOPY TIME:  Fluoroscopy Time: 58 minutes and 42 seconds. 2750 mGy. COMPLICATIONS: None immediate. PROCEDURE: Informed consent was  obtained from the patient following explanation of the procedure, risks, benefits and alternatives. The patient understands, agrees and consents for the procedure. All questions were addressed. A time out was performed prior to the initiation of the procedure. Maximal barrier sterile technique utilized including caps, mask, sterile gowns, sterile gloves, large sterile drape, hand hygiene, and chlorhexidine prep. Local anesthesia was provided with 1% lidocaine. The day prior to the procedure, drug-eluting beads were prepared by the Pharmacy with 2 separate vials 100-300 micron sized LC Beads, each loaded with 75 mg of doxorubicin. Ultrasound was used to confirm patency of the right common femoral artery. A permanent ultrasound image was recorded. The right common femoral artery was accessed utilizing a 21 gauge needle micropuncture set. After access, a 5 French sheath was placed. A 5 French Cobra catheter was advanced into the abdominal aorta and used to selectively catheterize the celiac axis. The catheter was further advanced over a guidewire into the common hepatic artery and selective arteriography performed at the level of the common hepatic artery. The catheter was advanced just beyond the gastroduodenal artery into the proper hepatic artery and rotational arteriography performed centered  over the liver. Three-dimensional processing of rotation arteriography was performed on a workstation and used for 3 dimensional mapping and additional selective arteriography within the right lobe of the liver. The 5 French catheter was then used to selectively catheterize the right hepatic artery. Selective arteriography was performed of the right hepatic artery. A microcatheter was then advanced through the 5 French catheter and used to selectively catheterize a superior right hepatic artery branch. Selective arteriography was performed. The catheter was then used to further selectively catheterize an additional superior  right hepatic arterial branch and selective arteriography performed. Additional selective arteriography was then performed at the level of the initial catheterized superior right hepatic artery branch and selective arteriography performed. Chemoembolization was performed at the level of the superior right hepatic artery branch with installation of 1 vial of doxorubicin loaded beads. The beads were diluted in contrast material and slowly instilled under fluoroscopic guidance. Additional arteriography was performed to assess flow. The microcatheter was retracted then used to selectively catheterize an additional right hepatic artery branch within the midportion of the right lobe of the liver. Selective arteriography was performed. A second mid right hepatic arterial branch was also selectively catheterized and selective arteriography performed. Chemoembolization was then performed at the level of this branch with installation of part of a vial of loaded beads. Additional arteriography was performed. The microcatheter was removed and additional arteriography performed through the 5 French catheter at the level of the right hepatic artery in order to assess residual blood supply to the tumor. The microcatheter was then reintroduced via the 5 French catheter and used to selectively catheterize an additional inferior right hepatic artery branch. Selective arteriography was performed through the microcatheter. Additional chemoembolization was then performed at the level the inferior right hepatic artery branch via the microcatheter with installation of drug-eluting beads. Additional arteriography was performed. Catheters were removed. Contrast injection was performed in an oblique projection at the level of the right groin in order to assess level of puncture of the right common femoral artery. Arteriotomy closure was then performed with the Cordis ExoSeal device. FINDINGS: Initial arteriography demonstrates numerous  irregular arterial branches supplying hypervascular tumor within the right lobe of the liver extending throughout much of the right lobe and having an oval shape. Reconstructed 3D imaging of arterial blood supply from rotational arteriography demonstrates clear enlargement of the mass since prior imaging with estimated dimensions of approximately 11 x 8.5 x 10 cm compared to approximately 8.2 x 6.2 x 8 cm by CT in November. There was ability to initially characterize and catheterize superior blood supply to the large tumor via a branch of the right hepatic artery. Selective arteriography demonstrates tumor blush along the superior aspect of the mass. During treatment of this branch with installation of drug-eluting beads there was also reflux of contrast into another branch which was also clearly supplying tumor resulting in further staining of the tumor with treatment. There appear to be approximately 1/3 of the tumor volume able to be treated with the initial installation of particles. This distribution did accommodate essentially 1 vial of the drug-eluting beads. Additional catheterization was then performed at the level of a mid right hepatic arterial branch with a descending course that is clearly supplying a portion of the central aspect of the tumor. This supply was able to be treated effectively with a portion of a vial of the drug-eluting particles. Additional catheterization was then performed of a medial descending right hepatic arterial branch that supplies the inferior aspect  of the tumor. This branch was able to be effectively treated with installation of part of a vial of drug-eluting particles. After completion of treatment, there was approximately 1/4 or slightly less volume of a vial of particles left over which were not administered. IMPRESSION: Transcatheter chemoembolization of large right lobe hepatocellular carcinoma as described in detail above which included selective catheterization and  transcatheter embolization within 3 vascular territories supplying the tumor. Drug-eluting LC beads loaded with doxorubicin were utilized for the treatment with nearly all of 2 vials loaded with a total of 150 mg of doxorubicin utilized. No was made on the 3 dimensional reconstruction of rotational arteriography of significant enlargement of the tumor since prior CT in November with maximum height of 11 cm today compared to approximately 8 cm previously. The patient will be observed overnight to treat any post embolic symptoms. Electronically Signed   By: Aletta Edouard M.D.   On: 03/27/2021 16:00   IR Angiogram Selective Each Additional Vessel  Result Date: 03/27/2021 INDICATION: Hepatocellular carcinoma of the right lobe of the liver. The patient is presenting for transcatheter chemoembolization with drug-eluting beads. EXAM: 1. ULTRASOUND GUIDANCE FOR VASCULAR ACCESS OF THE RIGHT COMMON FEMORAL ARTERY 2. SELECTIVE VISCERAL ARTERIOGRAPHY AT THE LEVEL OF THE COMMON HEPATIC ARTERY 3. THREE-DIMENSIONAL RECONSTRUCTION OF ROTATIONAL COMMON HEPATIC ARTERIOGRAPHY 4. ADDITIONAL SELECTIVE RIGHT HEPATIC ARTERIOGRAPHY 5. ADDITIONAL SELECTIVE ARTERIOGRAPHY OF SUPERIOR RIGHT HEPATIC ARTERY BRANCH 6. ADDITIONAL SELECTIVE ARTERIOGRAPHY OF ADDITIONAL SUPERIOR RIGHT HEPATIC ARTERY BRANCH 7. ADDITIONAL SELECTIVE ARTERIOGRAPHY OF MID RIGHT HEPATIC ARTERIAL BRANCH 8. ADDITIONAL SELECTIVE ARTERIOGRAPHY OF MID RIGHT HEPATIC ARTERIAL BRANCH 9. ADDITIONAL SELECTIVE ARTERIOGRAPHY OF INFERIOR RIGHT HEPATIC ARTERIAL BRANCH 10. TRANSCATHETER EMBOLIZATION OF RIGHT LOBE HEPATOCELLULAR CARCINOMA MEDICATIONS: 3.375 g IV Zosyn, 8 mg IV Decadron, 4 mg IV Zofran and 20 mg IV hydralazine ANESTHESIA/SEDATION: Moderate (conscious) sedation was employed during this procedure. A total of Versed 6.0 mg and Fentanyl 300 mcg was administered intravenously by the radiology nurse. Total intra-service moderate Sedation Time: 175 minutes. The patient's  level of consciousness and vital signs were monitored continuously by radiology nursing throughout the procedure under my direct supervision. CONTRAST:  171 mL Omnipaque 300 FLUOROSCOPY TIME:  Fluoroscopy Time: 58 minutes and 42 seconds. 2750 mGy. COMPLICATIONS: None immediate. PROCEDURE: Informed consent was obtained from the patient following explanation of the procedure, risks, benefits and alternatives. The patient understands, agrees and consents for the procedure. All questions were addressed. A time out was performed prior to the initiation of the procedure. Maximal barrier sterile technique utilized including caps, mask, sterile gowns, sterile gloves, large sterile drape, hand hygiene, and chlorhexidine prep. Local anesthesia was provided with 1% lidocaine. The day prior to the procedure, drug-eluting beads were prepared by the Pharmacy with 2 separate vials 100-300 micron sized LC Beads, each loaded with 75 mg of doxorubicin. Ultrasound was used to confirm patency of the right common femoral artery. A permanent ultrasound image was recorded. The right common femoral artery was accessed utilizing a 21 gauge needle micropuncture set. After access, a 5 French sheath was placed. A 5 French Cobra catheter was advanced into the abdominal aorta and used to selectively catheterize the celiac axis. The catheter was further advanced over a guidewire into the common hepatic artery and selective arteriography performed at the level of the common hepatic artery. The catheter was advanced just beyond the gastroduodenal artery into the proper hepatic artery and rotational arteriography performed centered over the liver. Three-dimensional processing of rotation arteriography was performed on a workstation and  used for 3 dimensional mapping and additional selective arteriography within the right lobe of the liver. The 5 French catheter was then used to selectively catheterize the right hepatic artery. Selective  arteriography was performed of the right hepatic artery. A microcatheter was then advanced through the 5 French catheter and used to selectively catheterize a superior right hepatic artery branch. Selective arteriography was performed. The catheter was then used to further selectively catheterize an additional superior right hepatic arterial branch and selective arteriography performed. Additional selective arteriography was then performed at the level of the initial catheterized superior right hepatic artery branch and selective arteriography performed. Chemoembolization was performed at the level of the superior right hepatic artery branch with installation of 1 vial of doxorubicin loaded beads. The beads were diluted in contrast material and slowly instilled under fluoroscopic guidance. Additional arteriography was performed to assess flow. The microcatheter was retracted then used to selectively catheterize an additional right hepatic artery branch within the midportion of the right lobe of the liver. Selective arteriography was performed. A second mid right hepatic arterial branch was also selectively catheterized and selective arteriography performed. Chemoembolization was then performed at the level of this branch with installation of part of a vial of loaded beads. Additional arteriography was performed. The microcatheter was removed and additional arteriography performed through the 5 French catheter at the level of the right hepatic artery in order to assess residual blood supply to the tumor. The microcatheter was then reintroduced via the 5 French catheter and used to selectively catheterize an additional inferior right hepatic artery branch. Selective arteriography was performed through the microcatheter. Additional chemoembolization was then performed at the level the inferior right hepatic artery branch via the microcatheter with installation of drug-eluting beads. Additional arteriography was  performed. Catheters were removed. Contrast injection was performed in an oblique projection at the level of the right groin in order to assess level of puncture of the right common femoral artery. Arteriotomy closure was then performed with the Cordis ExoSeal device. FINDINGS: Initial arteriography demonstrates numerous irregular arterial branches supplying hypervascular tumor within the right lobe of the liver extending throughout much of the right lobe and having an oval shape. Reconstructed 3D imaging of arterial blood supply from rotational arteriography demonstrates clear enlargement of the mass since prior imaging with estimated dimensions of approximately 11 x 8.5 x 10 cm compared to approximately 8.2 x 6.2 x 8 cm by CT in November. There was ability to initially characterize and catheterize superior blood supply to the large tumor via a branch of the right hepatic artery. Selective arteriography demonstrates tumor blush along the superior aspect of the mass. During treatment of this branch with installation of drug-eluting beads there was also reflux of contrast into another branch which was also clearly supplying tumor resulting in further staining of the tumor with treatment. There appear to be approximately 1/3 of the tumor volume able to be treated with the initial installation of particles. This distribution did accommodate essentially 1 vial of the drug-eluting beads. Additional catheterization was then performed at the level of a mid right hepatic arterial branch with a descending course that is clearly supplying a portion of the central aspect of the tumor. This supply was able to be treated effectively with a portion of a vial of the drug-eluting particles. Additional catheterization was then performed of a medial descending right hepatic arterial branch that supplies the inferior aspect of the tumor. This branch was able to be effectively treated with installation of  part of a vial of drug-eluting  particles. After completion of treatment, there was approximately 1/4 or slightly less volume of a vial of particles left over which were not administered. IMPRESSION: Transcatheter chemoembolization of large right lobe hepatocellular carcinoma as described in detail above which included selective catheterization and transcatheter embolization within 3 vascular territories supplying the tumor. Drug-eluting LC beads loaded with doxorubicin were utilized for the treatment with nearly all of 2 vials loaded with a total of 150 mg of doxorubicin utilized. No was made on the 3 dimensional reconstruction of rotational arteriography of significant enlargement of the tumor since prior CT in November with maximum height of 11 cm today compared to approximately 8 cm previously. The patient will be observed overnight to treat any post embolic symptoms. Electronically Signed   By: Aletta Edouard M.D.   On: 03/27/2021 16:00   IR 3D Independent Darreld Mclean  Result Date: 03/27/2021 INDICATION: Hepatocellular carcinoma of the right lobe of the liver. The patient is presenting for transcatheter chemoembolization with drug-eluting beads. EXAM: 1. ULTRASOUND GUIDANCE FOR VASCULAR ACCESS OF THE RIGHT COMMON FEMORAL ARTERY 2. SELECTIVE VISCERAL ARTERIOGRAPHY AT THE LEVEL OF THE COMMON HEPATIC ARTERY 3. THREE-DIMENSIONAL RECONSTRUCTION OF ROTATIONAL COMMON HEPATIC ARTERIOGRAPHY 4. ADDITIONAL SELECTIVE RIGHT HEPATIC ARTERIOGRAPHY 5. ADDITIONAL SELECTIVE ARTERIOGRAPHY OF SUPERIOR RIGHT HEPATIC ARTERY BRANCH 6. ADDITIONAL SELECTIVE ARTERIOGRAPHY OF ADDITIONAL SUPERIOR RIGHT HEPATIC ARTERY BRANCH 7. ADDITIONAL SELECTIVE ARTERIOGRAPHY OF MID RIGHT HEPATIC ARTERIAL BRANCH 8. ADDITIONAL SELECTIVE ARTERIOGRAPHY OF MID RIGHT HEPATIC ARTERIAL BRANCH 9. ADDITIONAL SELECTIVE ARTERIOGRAPHY OF INFERIOR RIGHT HEPATIC ARTERIAL BRANCH 10. TRANSCATHETER EMBOLIZATION OF RIGHT LOBE HEPATOCELLULAR CARCINOMA MEDICATIONS: 3.375 g IV Zosyn, 8 mg IV Decadron, 4 mg  IV Zofran and 20 mg IV hydralazine ANESTHESIA/SEDATION: Moderate (conscious) sedation was employed during this procedure. A total of Versed 6.0 mg and Fentanyl 300 mcg was administered intravenously by the radiology nurse. Total intra-service moderate Sedation Time: 175 minutes. The patient's level of consciousness and vital signs were monitored continuously by radiology nursing throughout the procedure under my direct supervision. CONTRAST:  171 mL Omnipaque 300 FLUOROSCOPY TIME:  Fluoroscopy Time: 58 minutes and 42 seconds. 2750 mGy. COMPLICATIONS: None immediate. PROCEDURE: Informed consent was obtained from the patient following explanation of the procedure, risks, benefits and alternatives. The patient understands, agrees and consents for the procedure. All questions were addressed. A time out was performed prior to the initiation of the procedure. Maximal barrier sterile technique utilized including caps, mask, sterile gowns, sterile gloves, large sterile drape, hand hygiene, and chlorhexidine prep. Local anesthesia was provided with 1% lidocaine. The day prior to the procedure, drug-eluting beads were prepared by the Pharmacy with 2 separate vials 100-300 micron sized LC Beads, each loaded with 75 mg of doxorubicin. Ultrasound was used to confirm patency of the right common femoral artery. A permanent ultrasound image was recorded. The right common femoral artery was accessed utilizing a 21 gauge needle micropuncture set. After access, a 5 French sheath was placed. A 5 French Cobra catheter was advanced into the abdominal aorta and used to selectively catheterize the celiac axis. The catheter was further advanced over a guidewire into the common hepatic artery and selective arteriography performed at the level of the common hepatic artery. The catheter was advanced just beyond the gastroduodenal artery into the proper hepatic artery and rotational arteriography performed centered over the liver.  Three-dimensional processing of rotation arteriography was performed on a workstation and used for 3 dimensional mapping and additional selective arteriography within the right lobe of the  liver. The 5 French catheter was then used to selectively catheterize the right hepatic artery. Selective arteriography was performed of the right hepatic artery. A microcatheter was then advanced through the 5 French catheter and used to selectively catheterize a superior right hepatic artery branch. Selective arteriography was performed. The catheter was then used to further selectively catheterize an additional superior right hepatic arterial branch and selective arteriography performed. Additional selective arteriography was then performed at the level of the initial catheterized superior right hepatic artery branch and selective arteriography performed. Chemoembolization was performed at the level of the superior right hepatic artery branch with installation of 1 vial of doxorubicin loaded beads. The beads were diluted in contrast material and slowly instilled under fluoroscopic guidance. Additional arteriography was performed to assess flow. The microcatheter was retracted then used to selectively catheterize an additional right hepatic artery branch within the midportion of the right lobe of the liver. Selective arteriography was performed. A second mid right hepatic arterial branch was also selectively catheterized and selective arteriography performed. Chemoembolization was then performed at the level of this branch with installation of part of a vial of loaded beads. Additional arteriography was performed. The microcatheter was removed and additional arteriography performed through the 5 French catheter at the level of the right hepatic artery in order to assess residual blood supply to the tumor. The microcatheter was then reintroduced via the 5 French catheter and used to selectively catheterize an additional inferior  right hepatic artery branch. Selective arteriography was performed through the microcatheter. Additional chemoembolization was then performed at the level the inferior right hepatic artery branch via the microcatheter with installation of drug-eluting beads. Additional arteriography was performed. Catheters were removed. Contrast injection was performed in an oblique projection at the level of the right groin in order to assess level of puncture of the right common femoral artery. Arteriotomy closure was then performed with the Cordis ExoSeal device. FINDINGS: Initial arteriography demonstrates numerous irregular arterial branches supplying hypervascular tumor within the right lobe of the liver extending throughout much of the right lobe and having an oval shape. Reconstructed 3D imaging of arterial blood supply from rotational arteriography demonstrates clear enlargement of the mass since prior imaging with estimated dimensions of approximately 11 x 8.5 x 10 cm compared to approximately 8.2 x 6.2 x 8 cm by CT in November. There was ability to initially characterize and catheterize superior blood supply to the large tumor via a branch of the right hepatic artery. Selective arteriography demonstrates tumor blush along the superior aspect of the mass. During treatment of this branch with installation of drug-eluting beads there was also reflux of contrast into another branch which was also clearly supplying tumor resulting in further staining of the tumor with treatment. There appear to be approximately 1/3 of the tumor volume able to be treated with the initial installation of particles. This distribution did accommodate essentially 1 vial of the drug-eluting beads. Additional catheterization was then performed at the level of a mid right hepatic arterial branch with a descending course that is clearly supplying a portion of the central aspect of the tumor. This supply was able to be treated effectively with a  portion of a vial of the drug-eluting particles. Additional catheterization was then performed of a medial descending right hepatic arterial branch that supplies the inferior aspect of the tumor. This branch was able to be effectively treated with installation of part of a vial of drug-eluting particles. After completion of treatment, there was approximately 1/4  or slightly less volume of a vial of particles left over which were not administered. IMPRESSION: Transcatheter chemoembolization of large right lobe hepatocellular carcinoma as described in detail above which included selective catheterization and transcatheter embolization within 3 vascular territories supplying the tumor. Drug-eluting LC beads loaded with doxorubicin were utilized for the treatment with nearly all of 2 vials loaded with a total of 150 mg of doxorubicin utilized. No was made on the 3 dimensional reconstruction of rotational arteriography of significant enlargement of the tumor since prior CT in November with maximum height of 11 cm today compared to approximately 8 cm previously. The patient will be observed overnight to treat any post embolic symptoms. Electronically Signed   By: Aletta Edouard M.D.   On: 03/27/2021 16:00   IR US Guidance  Result Date: 03/27/2021 INDICATION: Hepatocellular carcinoma of the right lobe of the liver. The patient is presenting for transcatheter chemoembolization with drug-eluting beads. EXAM: 1. ULTRASOUND GUIDANCE FOR VASCULAR ACCESS OF THE RIGHT COMMON FEMORAL ARTERY 2. SELECTIVE VISCERAL ARTERIOGRAPHY AT THE LEVEL OF THE COMMON HEPATIC ARTERY 3. THREE-DIMENSIONAL RECONSTRUCTION OF ROTATIONAL COMMON HEPATIC ARTERIOGRAPHY 4. ADDITIONAL SELECTIVE RIGHT HEPATIC ARTERIOGRAPHY 5. ADDITIONAL SELECTIVE ARTERIOGRAPHY OF SUPERIOR RIGHT HEPATIC ARTERY BRANCH 6. ADDITIONAL SELECTIVE ARTERIOGRAPHY OF ADDITIONAL SUPERIOR RIGHT HEPATIC ARTERY BRANCH 7. ADDITIONAL SELECTIVE ARTERIOGRAPHY OF MID RIGHT HEPATIC  ARTERIAL BRANCH 8. ADDITIONAL SELECTIVE ARTERIOGRAPHY OF MID RIGHT HEPATIC ARTERIAL BRANCH 9. ADDITIONAL SELECTIVE ARTERIOGRAPHY OF INFERIOR RIGHT HEPATIC ARTERIAL BRANCH 10. TRANSCATHETER EMBOLIZATION OF RIGHT LOBE HEPATOCELLULAR CARCINOMA MEDICATIONS: 3.375 g IV Zosyn, 8 mg IV Decadron, 4 mg IV Zofran and 20 mg IV hydralazine ANESTHESIA/SEDATION: Moderate (conscious) sedation was employed during this procedure. A total of Versed 6.0 mg and Fentanyl 300 mcg was administered intravenously by the radiology nurse. Total intra-service moderate Sedation Time: 175 minutes. The patient's level of consciousness and vital signs were monitored continuously by radiology nursing throughout the procedure under my direct supervision. CONTRAST:  171 mL Omnipaque 300 FLUOROSCOPY TIME:  Fluoroscopy Time: 58 minutes and 42 seconds. 2750 mGy. COMPLICATIONS: None immediate. PROCEDURE: Informed consent was obtained from the patient following explanation of the procedure, risks, benefits and alternatives. The patient understands, agrees and consents for the procedure. All questions were addressed. A time out was performed prior to the initiation of the procedure. Maximal barrier sterile technique utilized including caps, mask, sterile gowns, sterile gloves, large sterile drape, hand hygiene, and chlorhexidine prep. Local anesthesia was provided with 1% lidocaine. The day prior to the procedure, drug-eluting beads were prepared by the Pharmacy with 2 separate vials 100-300 micron sized LC Beads, each loaded with 75 mg of doxorubicin. Ultrasound was used to confirm patency of the right common femoral artery. A permanent ultrasound image was recorded. The right common femoral artery was accessed utilizing a 21 gauge needle micropuncture set. After access, a 5 French sheath was placed. A 5 French Cobra catheter was advanced into the abdominal aorta and used to selectively catheterize the celiac axis. The catheter was further advanced over a  guidewire into the common hepatic artery and selective arteriography performed at the level of the common hepatic artery. The catheter was advanced just beyond the gastroduodenal artery into the proper hepatic artery and rotational arteriography performed centered over the liver. Three-dimensional processing of rotation arteriography was performed on a workstation and used for 3 dimensional mapping and additional selective arteriography within the right lobe of the liver. The 5 French catheter was then used to selectively catheterize the right hepatic artery. Selective arteriography  was performed of the right hepatic artery. A microcatheter was then advanced through the 5 French catheter and used to selectively catheterize a superior right hepatic artery branch. Selective arteriography was performed. The catheter was then used to further selectively catheterize an additional superior right hepatic arterial branch and selective arteriography performed. Additional selective arteriography was then performed at the level of the initial catheterized superior right hepatic artery branch and selective arteriography performed. Chemoembolization was performed at the level of the superior right hepatic artery branch with installation of 1 vial of doxorubicin loaded beads. The beads were diluted in contrast material and slowly instilled under fluoroscopic guidance. Additional arteriography was performed to assess flow. The microcatheter was retracted then used to selectively catheterize an additional right hepatic artery branch within the midportion of the right lobe of the liver. Selective arteriography was performed. A second mid right hepatic arterial branch was also selectively catheterized and selective arteriography performed. Chemoembolization was then performed at the level of this branch with installation of part of a vial of loaded beads. Additional arteriography was performed. The microcatheter was removed and  additional arteriography performed through the 5 French catheter at the level of the right hepatic artery in order to assess residual blood supply to the tumor. The microcatheter was then reintroduced via the 5 French catheter and used to selectively catheterize an additional inferior right hepatic artery branch. Selective arteriography was performed through the microcatheter. Additional chemoembolization was then performed at the level the inferior right hepatic artery branch via the microcatheter with installation of drug-eluting beads. Additional arteriography was performed. Catheters were removed. Contrast injection was performed in an oblique projection at the level of the right groin in order to assess level of puncture of the right common femoral artery. Arteriotomy closure was then performed with the Cordis ExoSeal device. FINDINGS: Initial arteriography demonstrates numerous irregular arterial branches supplying hypervascular tumor within the right lobe of the liver extending throughout much of the right lobe and having an oval shape. Reconstructed 3D imaging of arterial blood supply from rotational arteriography demonstrates clear enlargement of the mass since prior imaging with estimated dimensions of approximately 11 x 8.5 x 10 cm compared to approximately 8.2 x 6.2 x 8 cm by CT in November. There was ability to initially characterize and catheterize superior blood supply to the large tumor via a branch of the right hepatic artery. Selective arteriography demonstrates tumor blush along the superior aspect of the mass. During treatment of this branch with installation of drug-eluting beads there was also reflux of contrast into another branch which was also clearly supplying tumor resulting in further staining of the tumor with treatment. There appear to be approximately 1/3 of the tumor volume able to be treated with the initial installation of particles. This distribution did accommodate essentially 1  vial of the drug-eluting beads. Additional catheterization was then performed at the level of a mid right hepatic arterial branch with a descending course that is clearly supplying a portion of the central aspect of the tumor. This supply was able to be treated effectively with a portion of a vial of the drug-eluting particles. Additional catheterization was then performed of a medial descending right hepatic arterial branch that supplies the inferior aspect of the tumor. This branch was able to be effectively treated with installation of part of a vial of drug-eluting particles. After completion of treatment, there was approximately 1/4 or slightly less volume of a vial of particles left over which were not administered. IMPRESSION: Transcatheter  chemoembolization of large right lobe hepatocellular carcinoma as described in detail above which included selective catheterization and transcatheter embolization within 3 vascular territories supplying the tumor. Drug-eluting LC beads loaded with doxorubicin were utilized for the treatment with nearly all of 2 vials loaded with a total of 150 mg of doxorubicin utilized. No was made on the 3 dimensional reconstruction of rotational arteriography of significant enlargement of the tumor since prior CT in November with maximum height of 11 cm today compared to approximately 8 cm previously. The patient will be observed overnight to treat any post embolic symptoms. Electronically Signed   By: Aletta Edouard M.D.   On: 03/27/2021 16:00   DG CHEST PORT 1 VIEW  Result Date: 03/27/2021 CLINICAL DATA:  Chest pain. EXAM: PORTABLE CHEST 1 VIEW COMPARISON:  Chest x-ray dated January 11, 2021. FINDINGS: The heart is at the upper limits of normal in size. Normal pulmonary vascularity. Low lung volumes. Mild left basilar opacity. No pleural effusion or pneumothorax. No acute osseous abnormality. IMPRESSION: 1. Mild left basilar atelectasis versus infiltrate. Electronically Signed    By: Titus Dubin M.D.   On: 03/27/2021 16:45   IR EMBO TUMOR ORGAN ISCHEMIA INFARCT INC GUIDE ROADMAPPING  Result Date: 03/27/2021 INDICATION: Hepatocellular carcinoma of the right lobe of the liver. The patient is presenting for transcatheter chemoembolization with drug-eluting beads. EXAM: 1. ULTRASOUND GUIDANCE FOR VASCULAR ACCESS OF THE RIGHT COMMON FEMORAL ARTERY 2. SELECTIVE VISCERAL ARTERIOGRAPHY AT THE LEVEL OF THE COMMON HEPATIC ARTERY 3. THREE-DIMENSIONAL RECONSTRUCTION OF ROTATIONAL COMMON HEPATIC ARTERIOGRAPHY 4. ADDITIONAL SELECTIVE RIGHT HEPATIC ARTERIOGRAPHY 5. ADDITIONAL SELECTIVE ARTERIOGRAPHY OF SUPERIOR RIGHT HEPATIC ARTERY BRANCH 6. ADDITIONAL SELECTIVE ARTERIOGRAPHY OF ADDITIONAL SUPERIOR RIGHT HEPATIC ARTERY BRANCH 7. ADDITIONAL SELECTIVE ARTERIOGRAPHY OF MID RIGHT HEPATIC ARTERIAL BRANCH 8. ADDITIONAL SELECTIVE ARTERIOGRAPHY OF MID RIGHT HEPATIC ARTERIAL BRANCH 9. ADDITIONAL SELECTIVE ARTERIOGRAPHY OF INFERIOR RIGHT HEPATIC ARTERIAL BRANCH 10. TRANSCATHETER EMBOLIZATION OF RIGHT LOBE HEPATOCELLULAR CARCINOMA MEDICATIONS: 3.375 g IV Zosyn, 8 mg IV Decadron, 4 mg IV Zofran and 20 mg IV hydralazine ANESTHESIA/SEDATION: Moderate (conscious) sedation was employed during this procedure. A total of Versed 6.0 mg and Fentanyl 300 mcg was administered intravenously by the radiology nurse. Total intra-service moderate Sedation Time: 175 minutes. The patient's level of consciousness and vital signs were monitored continuously by radiology nursing throughout the procedure under my direct supervision. CONTRAST:  171 mL Omnipaque 300 FLUOROSCOPY TIME:  Fluoroscopy Time: 58 minutes and 42 seconds. 2750 mGy. COMPLICATIONS: None immediate. PROCEDURE: Informed consent was obtained from the patient following explanation of the procedure, risks, benefits and alternatives. The patient understands, agrees and consents for the procedure. All questions were addressed. A time out was performed prior to the  initiation of the procedure. Maximal barrier sterile technique utilized including caps, mask, sterile gowns, sterile gloves, large sterile drape, hand hygiene, and chlorhexidine prep. Local anesthesia was provided with 1% lidocaine. The day prior to the procedure, drug-eluting beads were prepared by the Pharmacy with 2 separate vials 100-300 micron sized LC Beads, each loaded with 75 mg of doxorubicin. Ultrasound was used to confirm patency of the right common femoral artery. A permanent ultrasound image was recorded. The right common femoral artery was accessed utilizing a 21 gauge needle micropuncture set. After access, a 5 French sheath was placed. A 5 French Cobra catheter was advanced into the abdominal aorta and used to selectively catheterize the celiac axis. The catheter was further advanced over a guidewire into the common hepatic artery and selective arteriography performed at the  level of the common hepatic artery. The catheter was advanced just beyond the gastroduodenal artery into the proper hepatic artery and rotational arteriography performed centered over the liver. Three-dimensional processing of rotation arteriography was performed on a workstation and used for 3 dimensional mapping and additional selective arteriography within the right lobe of the liver. The 5 French catheter was then used to selectively catheterize the right hepatic artery. Selective arteriography was performed of the right hepatic artery. A microcatheter was then advanced through the 5 French catheter and used to selectively catheterize a superior right hepatic artery branch. Selective arteriography was performed. The catheter was then used to further selectively catheterize an additional superior right hepatic arterial branch and selective arteriography performed. Additional selective arteriography was then performed at the level of the initial catheterized superior right hepatic artery branch and selective arteriography  performed. Chemoembolization was performed at the level of the superior right hepatic artery branch with installation of 1 vial of doxorubicin loaded beads. The beads were diluted in contrast material and slowly instilled under fluoroscopic guidance. Additional arteriography was performed to assess flow. The microcatheter was retracted then used to selectively catheterize an additional right hepatic artery branch within the midportion of the right lobe of the liver. Selective arteriography was performed. A second mid right hepatic arterial branch was also selectively catheterized and selective arteriography performed. Chemoembolization was then performed at the level of this branch with installation of part of a vial of loaded beads. Additional arteriography was performed. The microcatheter was removed and additional arteriography performed through the 5 French catheter at the level of the right hepatic artery in order to assess residual blood supply to the tumor. The microcatheter was then reintroduced via the 5 French catheter and used to selectively catheterize an additional inferior right hepatic artery branch. Selective arteriography was performed through the microcatheter. Additional chemoembolization was then performed at the level the inferior right hepatic artery branch via the microcatheter with installation of drug-eluting beads. Additional arteriography was performed. Catheters were removed. Contrast injection was performed in an oblique projection at the level of the right groin in order to assess level of puncture of the right common femoral artery. Arteriotomy closure was then performed with the Cordis ExoSeal device. FINDINGS: Initial arteriography demonstrates numerous irregular arterial branches supplying hypervascular tumor within the right lobe of the liver extending throughout much of the right lobe and having an oval shape. Reconstructed 3D imaging of arterial blood supply from rotational  arteriography demonstrates clear enlargement of the mass since prior imaging with estimated dimensions of approximately 11 x 8.5 x 10 cm compared to approximately 8.2 x 6.2 x 8 cm by CT in November. There was ability to initially characterize and catheterize superior blood supply to the large tumor via a branch of the right hepatic artery. Selective arteriography demonstrates tumor blush along the superior aspect of the mass. During treatment of this branch with installation of drug-eluting beads there was also reflux of contrast into another branch which was also clearly supplying tumor resulting in further staining of the tumor with treatment. There appear to be approximately 1/3 of the tumor volume able to be treated with the initial installation of particles. This distribution did accommodate essentially 1 vial of the drug-eluting beads. Additional catheterization was then performed at the level of a mid right hepatic arterial branch with a descending course that is clearly supplying a portion of the central aspect of the tumor. This supply was able to be treated effectively with a portion  of a vial of the drug-eluting particles. Additional catheterization was then performed of a medial descending right hepatic arterial branch that supplies the inferior aspect of the tumor. This branch was able to be effectively treated with installation of part of a vial of drug-eluting particles. After completion of treatment, there was approximately 1/4 or slightly less volume of a vial of particles left over which were not administered. IMPRESSION: Transcatheter chemoembolization of large right lobe hepatocellular carcinoma as described in detail above which included selective catheterization and transcatheter embolization within 3 vascular territories supplying the tumor. Drug-eluting LC beads loaded with doxorubicin were utilized for the treatment with nearly all of 2 vials loaded with a total of 150 mg of doxorubicin  utilized. No was made on the 3 dimensional reconstruction of rotational arteriography of significant enlargement of the tumor since prior CT in November with maximum height of 11 cm today compared to approximately 8 cm previously. The patient will be observed overnight to treat any post embolic symptoms. Electronically Signed   By: Aletta Edouard M.D.   On: 03/27/2021 16:00   IR Radiologist Eval & Mgmt  Result Date: 04/16/2021 Please refer to notes tab for details about interventional procedure. (Op Note)   Labs:  CBC: Recent Labs    01/21/21 1230 03/18/21 1533 03/27/21 0720 03/28/21 0521  WBC 6.1 3.9* 4.8 3.7*  HGB 12.1* 13.4 14.0 14.3  HCT 37.6* 42.1 42.6 43.2  PLT 274 204 213 175    COAGS: Recent Labs    01/11/21 2005 01/21/21 1230 03/27/21 0720  INR 1.0 1.0 1.0  APTT  --   --  27    BMP: Recent Labs    03/18/21 1533 03/27/21 0720 03/28/21 0521 03/28/21 1031  NA 140 131* 126* 130*  K 4.5 4.2 4.7 5.0  CL 102 101 95* 95*  CO2 29 26 23 25   GLUCOSE 92 105* 116* 108*  BUN 17 20 12 12   CALCIUM 9.2 9.0 8.8* 9.0  CREATININE 0.93 0.69 0.62 0.78  GFRNONAA >60 >60 >60 >60    LIVER FUNCTION TESTS: Recent Labs    01/21/21 1230 03/18/21 1533 03/27/21 0720 03/28/21 0521  BILITOT 0.3 0.3 0.6 0.7  AST 32 41 41 383*  ALT 36 67* 61* 450*  ALKPHOS 68 74 81 92  PROT 8.1 8.1 8.6* 8.2*  ALBUMIN 4.1 3.8 4.1 3.7     Assessment and Plan:  Franklin Rivera is doing well after chemoembolization of of arterial supply to a large hepatocellular carcinoma of the right lobe of the liver with drug-eluting beads.  I recommended a follow-up multiphase CT of the abdomen at the end of February, 2 months post treatment to evaluate the liver and tumor volume after treatment.  Electronically Signed: Azzie Roup 04/16/2021, 4:27 PM    I spent a total of  10 Minutes in remote  clinical consultation, greater than 50% of which was counseling/coordinating care post chemoembolization  of hepatocellular carcinoma.    Visit type: Audio only (telephone). Audio (no video) only due to patient's lack of internet/smartphone capability. Alternative for in-person consultation at [facility], [facility] [address], [place], [place]. This visit type was conducted due to national recommendations for restrictions regarding the COVID-19 Pandemic (e.g. social distancing).  This format is felt to be most appropriate for this patient at this time.  All issues noted in this document were discussed and addressed.

## 2021-04-18 ENCOUNTER — Encounter: Payer: Self-pay | Admitting: Hematology

## 2021-04-18 NOTE — Progress Notes (Signed)
Patient called to inquire about applying for grant.  Advised patient I would reach out to him once oncologist has established treatment plan. He verbalized understanding.  He has my contact name and number for any additional financial questions or concerns.

## 2021-05-08 ENCOUNTER — Other Ambulatory Visit: Payer: Self-pay | Admitting: Interventional Radiology

## 2021-05-08 DIAGNOSIS — C220 Liver cell carcinoma: Secondary | ICD-10-CM

## 2021-05-13 DIAGNOSIS — C220 Liver cell carcinoma: Secondary | ICD-10-CM | POA: Diagnosis not present

## 2021-05-13 DIAGNOSIS — M79671 Pain in right foot: Secondary | ICD-10-CM | POA: Diagnosis not present

## 2021-05-13 DIAGNOSIS — Z6826 Body mass index (BMI) 26.0-26.9, adult: Secondary | ICD-10-CM | POA: Diagnosis not present

## 2021-05-13 DIAGNOSIS — Z79899 Other long term (current) drug therapy: Secondary | ICD-10-CM | POA: Diagnosis not present

## 2021-05-13 DIAGNOSIS — I10 Essential (primary) hypertension: Secondary | ICD-10-CM | POA: Diagnosis not present

## 2021-05-14 ENCOUNTER — Telehealth: Payer: Self-pay | Admitting: Family Medicine

## 2021-05-14 NOTE — Telephone Encounter (Signed)
I am fine with them changing the dose if needed.  Algis Greenhouse. Jerline Pain, MD 05/14/2021 11:49 AM

## 2021-05-14 NOTE — Telephone Encounter (Signed)
Please see note and advise  

## 2021-05-14 NOTE — Telephone Encounter (Signed)
Pt verified DOB, advised pt that Dr Jerline Pain is ok and willing to prescribe a different dosage, contacted [place] to speak w/ Dr Carney Bern office and advised.

## 2021-05-14 NOTE — Telephone Encounter (Signed)
Pt states he has been taking current medication and it is causing stomach issues for him. His provider at [facility], Dr. Vira Browns, is willing to prescribe a different dosage after he hears from Dr Jerline Pain. Please advise  MEDICATION: HYDROcodone-acetaminophen (NORCO) 7.5-325 MG tablet

## 2021-05-22 ENCOUNTER — Other Ambulatory Visit: Payer: Self-pay

## 2021-05-22 DIAGNOSIS — C220 Liver cell carcinoma: Secondary | ICD-10-CM

## 2021-05-24 ENCOUNTER — Ambulatory Visit (HOSPITAL_COMMUNITY)
Admission: RE | Admit: 2021-05-24 | Discharge: 2021-05-24 | Disposition: A | Discharge status: Home / Self Care | Payer: Medicare HMO | Source: Ambulatory Visit | Attending: Interventional Radiology | Admitting: Interventional Radiology

## 2021-05-24 ENCOUNTER — Other Ambulatory Visit: Payer: Self-pay

## 2021-05-24 ENCOUNTER — Encounter (HOSPITAL_COMMUNITY): Payer: Self-pay

## 2021-05-24 DIAGNOSIS — C787 Secondary malignant neoplasm of liver and intrahepatic bile duct: Secondary | ICD-10-CM | POA: Diagnosis not present

## 2021-05-24 DIAGNOSIS — C220 Liver cell carcinoma: Secondary | ICD-10-CM | POA: Diagnosis not present

## 2021-05-24 DIAGNOSIS — C801 Malignant (primary) neoplasm, unspecified: Secondary | ICD-10-CM | POA: Diagnosis not present

## 2021-05-24 LAB — POCT I-STAT CREATININE: Creatinine, Ser: 0.9 mg/dL (ref 0.61–1.24)

## 2021-05-24 MED ORDER — IOHEXOL 300 MG/ML  SOLN
100.0000 mL | Freq: Once | INTRAMUSCULAR | Status: AC | PRN
  Administered 2021-05-24: 100 mL via INTRAVENOUS

## 2021-05-24 MED ORDER — SODIUM CHLORIDE (PF) 0.9 % IJ SOLN
INTRAMUSCULAR | Status: AC
Start: 2021-05-24 — End: 2021-05-24
  Filled 2021-05-24: qty 50

## 2021-05-27 ENCOUNTER — Encounter: Payer: Self-pay | Admitting: *Deleted

## 2021-05-27 ENCOUNTER — Ambulatory Visit
Admission: RE | Admit: 2021-05-27 | Discharge: 2021-05-27 | Disposition: A | Discharge status: Home / Self Care | Payer: Medicare HMO | Source: Ambulatory Visit | Attending: Interventional Radiology | Admitting: Interventional Radiology

## 2021-05-27 DIAGNOSIS — Z9889 Other specified postprocedural states: Secondary | ICD-10-CM | POA: Diagnosis not present

## 2021-05-27 DIAGNOSIS — C220 Liver cell carcinoma: Secondary | ICD-10-CM | POA: Diagnosis not present

## 2021-05-27 HISTORY — PX: IR RADIOLOGIST EVAL & MGMT: IMG5224

## 2021-05-27 NOTE — Progress Notes (Signed)
Chief Complaint: Patient was seen in consultation today for follow-up after chemoembolization of the liver.  History of Present Illness: Franklin Rivera is a 71 y.o. male with a large hepatocellular carcinoma of the right lobe of the liver who is status post initial transcatheter arterial chemoembolization via supplying right hepatic arterial branches on 03/27/2021 consisting of administration of most of 2 vials of 100-300 m sized LC beads each loaded with 75 mg of doxorubicin.  After the procedure, he had no significant pain.  He did have some nausea for approximately one and a half weeks with decrease in appetite.  His appetite is now back to normal. Today he is completely asymptomatic.  Past Medical History:  Diagnosis Date   Allergy    Headache    Hyperlipidemia     Past Surgical History:  Procedure Laterality Date   BLADDER SURGERY     IR 3D INDEPENDENT [facility]KST  03/27/2021   IR ANGIOGRAM SELECTIVE EACH ADDITIONAL VESSEL  03/27/2021   IR ANGIOGRAM SELECTIVE EACH ADDITIONAL VESSEL  03/27/2021   IR ANGIOGRAM SELECTIVE EACH ADDITIONAL VESSEL  03/27/2021   IR ANGIOGRAM SELECTIVE EACH ADDITIONAL VESSEL  03/27/2021   IR ANGIOGRAM SELECTIVE EACH ADDITIONAL VESSEL  03/27/2021   IR ANGIOGRAM SELECTIVE EACH ADDITIONAL VESSEL  03/27/2021   IR ANGIOGRAM VISCERAL SELECTIVE  03/27/2021   IR EMBO TUMOR ORGAN ISCHEMIA INFARCT INC GUIDE ROADMAPPING  03/27/2021   IR RADIOLOGIST EVAL & MGMT  03/07/2021   IR RADIOLOGIST EVAL & MGMT  04/16/2021   IR US GUIDANCE  03/27/2021   NASAL FRACTURE SURGERY     Skull Surgery      Allergies: Patient has no known allergies.  Medications: Prior to Admission medications   Medication Sig Start Date End Date Taking? Authorizing Provider  cetirizine (ZYRTEC) 10 MG tablet Take 1 tablet (10 mg total) by mouth daily. 01/08/21   [name], MD  HYDROcodone-acetaminophen (NORCO) 7.5-325 MG tablet Take 1 tablet by mouth as directed. 0.5-1 tablet(s) by mouth  1-3 times daily 03/12/21   [provider]  hydrOXYzine (ATARAX/VISTARIL) 50 MG tablet Take 1 tablet (50 mg total) by mouth at bedtime as needed (sleep). 01/08/21   [name], MD  tadalafil (CIALIS) 10 MG tablet Take 1 tablet (10 mg total) by mouth daily as needed for erectile dysfunction. 05/25/20   [name], MD     Family History  Problem Relation Age of Onset   Cancer Neg Hx     Social History   Socioeconomic History   Marital status: Married    Spouse name: Not on file   Number of children: Not on file   Years of education: Not on file   Highest education level: Not on file  Occupational History   Not on file  Tobacco Use   Smoking status: Never   Smokeless tobacco: Never  Vaping Use   Vaping Use: Never used  Substance and Sexual Activity   Alcohol use: Never   Drug use: Not Currently   Sexual activity: Not on file  Other Topics Concern   Not on file  Social History Narrative   Not on file   Social Determinants of Health   Financial Resource Strain: Low Risk    Difficulty of Paying Living Expenses: Not hard at all  Food Insecurity: No Food Insecurity   Worried About Running Out of Food in the Last Year: Never true   Amanda in the Last Year: Never true  Transportation Needs: No Data processing manager (Medical): No   Lack of Transportation (Non-Medical): No  Physical Activity: Inactive   Days of Exercise per Week: 0 days   Minutes of Exercise per Session: 0 min  Stress: No Stress Concern Present   Feeling of Stress : Not at all  Social Connections: Moderately Isolated   Frequency of Communication with Friends and Family: More than three times a week   Frequency of Social Gatherings with Friends and Family: More than three times a week   Attends Religious Services: Never   Marine scientist or Organizations: No   Attends Archivist Meetings: Never   Marital Status: Married    ECOG Status: 0  - Asymptomatic  Review of Systems: A 12 point ROS discussed and pertinent positives are indicated in the HPI above.  All other systems are negative.  Review of Systems  Constitutional: Negative.   Respiratory: Negative.    Cardiovascular: Negative.   Gastrointestinal: Negative.   Genitourinary: Negative.   Musculoskeletal: Negative.   Neurological: Negative.    Vital Signs: There were no vitals taken for this visit.  Physical Exam Constitutional:      General: He is not in acute distress.    Appearance: Normal appearance. He is not ill-appearing, toxic-appearing or diaphoretic.  Abdominal:     General: There is no distension.     Palpations: Abdomen is soft.     Tenderness: There is no abdominal tenderness. There is no guarding or rebound.  Musculoskeletal:        General: No swelling.  Skin:    General: Skin is warm and dry.  Neurological:     General: No focal deficit present.     Mental Status: He is alert and oriented to person, place, and time.     Imaging: CT ABDOMEN W WO CONTRAST  Result Date: 05/27/2021 CLINICAL DATA:  Metastatic carcinoma. Chemoembolization to the RIGHT hepatic lobe 03/27/2021. EXAM: CT ABDOMEN WITHOUT AND WITH CONTRAST TECHNIQUE: Multidetector CT imaging of the abdomen was performed following the standard protocol before and following the bolus administration of intravenous contrast. RADIATION DOSE REDUCTION: This exam was performed according to the departmental dose-optimization program which includes automated exposure control, adjustment of the mA and/or kV according to patient size and/or use of iterative reconstruction technique. CONTRAST:  117m OMNIPAQUE IOHEXOL 300 MG/ML  SOLN COMPARISON:  CT 02/05/2021 FINDINGS: Lower chest:  Lung bases are clear. Hepatobiliary: The mass lesion in the posterior RIGHT hepatic lobe has increased in volume measuring 12.3 by 8.1 cm in axial dimension compared to 7.9 x 5.4 cm. A portion of the volume increases  related to central necrosis. There is additional nodular extension posteriorly to the peritoneal surface measuring 3.5 x 1.9 cm which is new from prior (image 48/6). Probable new lesion in the dome of the liver measuring 2.7 by 2.4 cm. Gallbladder is normal. No biliary duct dilatation. Portal veins are patent. No ascites. Pancreas: Normal pancreatic parenchymal intensity. No ductal dilatation or inflammation. Spleen: Normal spleen. Adrenals/urinary tract: Adrenal glands and kidneys are normal. Stomach/Bowel: Stomach and limited of the small bowel is unremarkable Vascular/Lymphatic: Abdominal aortic normal caliber. No retroperitoneal periportal lymphadenopathy. Musculoskeletal: No aggressive osseous lesion IMPRESSION: 1. Interval significant progression dominant mass in the RIGHT hepatic lobe compared to CT 02/05/2021. 2. New malignant lesion in the dome of the liver. Electronically Signed   By: SSuzy BouchardM.D.   On: 05/27/2021 08:29    Labs:  CBC: Recent Labs    01/21/21 1230 03/18/21 1533 03/27/21 0720 03/28/21 0521  WBC 6.1 3.9* 4.8 3.7*  HGB 12.1* 13.4 14.0 14.3  HCT 37.6* 42.1 42.6 43.2  PLT 274 204 213 175    COAGS: Recent Labs    01/11/21 2005 01/21/21 1230 03/27/21 0720  INR 1.0 1.0 1.0  APTT  --   --  27    BMP: Recent Labs    03/18/21 1533 03/27/21 0720 03/28/21 0521 03/28/21 1031 05/24/21 0823  NA 140 131* 126* 130*  --   K 4.5 4.2 4.7 5.0  --   CL 102 101 95* 95*  --   CO2 '29 26 23 25  ' --   GLUCOSE 92 105* 116* 108*  --   BUN '17 20 12 12  ' --   CALCIUM 9.2 9.0 8.8* 9.0  --   CREATININE 0.93 0.69 0.62 0.78 0.90  GFRNONAA >60 >60 >60 >60  --     LIVER FUNCTION TESTS: Recent Labs    01/21/21 1230 03/18/21 1533 03/27/21 0720 03/28/21 0521  BILITOT 0.3 0.3 0.6 0.7  AST 32 41 41 383*  ALT 36 67* 61* 450*  ALKPHOS 68 74 81 92  PROT 8.1 8.1 8.6* 8.2*  ALBUMIN 4.1 3.8 4.1 3.7     Assessment and Plan:  I met with Franklin Rivera and his wife. We  reviewed the follow up CT of the abdomen dated 05/24/21.  The study does demonstrate some interval necrosis centered in the region of the posterior, segment VI right lobe hepatocellular carcinoma after chemoembolization.  Based on analysis, I estimated approximately 30-40% necrosis of the original dominant tumor.  However, there clearly is progression of malignancy especially around the margins of the original mass with more prominent extension into the right lobe and a more prominent subcapsular component protruding from the posterolateral capsular surface.  There are also 2 foci of satellite lesions in the right lobe.  No regional metastatic disease is identified.  Given appearance, I recommended that Franklin Rivera discuss systemic treatment options with Dr. Irene Limbo.  Although yttrium-90 radioembolization to the right lobe of the liver remains an option for treatment of residual disease, I am concerned based on tumor progression and new satellite lesions that radioembolization alone may not be able to control disease.  If he were able to tolerate systemic therapy and have some regression of tumor, yttrium-90 radioembolization may be more effective as an additional second line treatment in the future.  Electronically Signed: Azzie Roup 05/27/2021, 8:36 AM    I spent a total of  15 Minutes in face to face in clinical consultation, greater than 50% of which was counseling/coordinating care for hepatocellular carcinoma.

## 2021-06-07 ENCOUNTER — Other Ambulatory Visit: Payer: Self-pay

## 2021-06-07 DIAGNOSIS — C220 Liver cell carcinoma: Secondary | ICD-10-CM

## 2021-06-07 DIAGNOSIS — M79671 Pain in right foot: Secondary | ICD-10-CM | POA: Diagnosis not present

## 2021-06-07 DIAGNOSIS — I10 Essential (primary) hypertension: Secondary | ICD-10-CM | POA: Diagnosis not present

## 2021-06-07 DIAGNOSIS — Z6827 Body mass index (BMI) 27.0-27.9, adult: Secondary | ICD-10-CM | POA: Diagnosis not present

## 2021-06-07 DIAGNOSIS — Z79899 Other long term (current) drug therapy: Secondary | ICD-10-CM | POA: Diagnosis not present

## 2021-06-10 ENCOUNTER — Other Ambulatory Visit: Payer: Self-pay

## 2021-06-10 ENCOUNTER — Inpatient Hospital Stay: Payer: Medicare HMO | Attending: Hematology | Admitting: Hematology

## 2021-06-10 ENCOUNTER — Inpatient Hospital Stay: Payer: Medicare HMO

## 2021-06-10 VITALS — BP 162/108 | HR 88 | Temp 97.2°F | Resp 17 | Wt 193.2 lb

## 2021-06-10 DIAGNOSIS — Z8619 Personal history of other infectious and parasitic diseases: Secondary | ICD-10-CM | POA: Insufficient documentation

## 2021-06-10 DIAGNOSIS — C220 Liver cell carcinoma: Secondary | ICD-10-CM

## 2021-06-10 DIAGNOSIS — Z5112 Encounter for antineoplastic immunotherapy: Secondary | ICD-10-CM | POA: Insufficient documentation

## 2021-06-10 DIAGNOSIS — Z79899 Other long term (current) drug therapy: Secondary | ICD-10-CM | POA: Diagnosis not present

## 2021-06-10 LAB — CMP (CANCER CENTER ONLY)
ALT: 33 U/L (ref 0–44)
AST: 31 U/L (ref 15–41)
Albumin: 3.7 g/dL (ref 3.5–5.0)
Alkaline Phosphatase: 127 U/L — ABNORMAL HIGH (ref 38–126)
Anion gap: 7 (ref 5–15)
BUN: 16 mg/dL (ref 8–23)
CO2: 28 mmol/L (ref 22–32)
Calcium: 9.3 mg/dL (ref 8.9–10.3)
Chloride: 102 mmol/L (ref 98–111)
Creatinine: 0.86 mg/dL (ref 0.61–1.24)
GFR, Estimated: >60 mL/min (ref >60)
Glucose, Bld: 94 mg/dL (ref 70–99)
Potassium: 4.2 mmol/L (ref 3.5–5.1)
Sodium: 137 mmol/L (ref 135–145)
Total Bilirubin: 0.4 mg/dL (ref 0.3–1.2)
Total Protein: 8.2 g/dL — ABNORMAL HIGH (ref 6.5–8.1)

## 2021-06-10 LAB — CBC WITH DIFFERENTIAL (CANCER CENTER ONLY)
Abs Immature Granulocytes: 0.01 10*3/uL (ref 0.00–0.07)
Basophils Absolute: 0 10*3/uL (ref 0.0–0.1)
Basophils Relative: 1 %
Eosinophils Absolute: 0.1 10*3/uL (ref 0.0–0.5)
Eosinophils Relative: 3 %
HCT: 38.4 % — ABNORMAL LOW (ref 39.0–52.0)
Hemoglobin: 12.5 g/dL — ABNORMAL LOW (ref 13.0–17.0)
Immature Granulocytes: 0 %
Lymphocytes Relative: 23 %
Lymphs Abs: 1 10*3/uL (ref 0.7–4.0)
MCH: 27.9 pg (ref 26.0–34.0)
MCHC: 32.6 g/dL (ref 30.0–36.0)
MCV: 85.7 fL (ref 80.0–100.0)
Monocytes Absolute: 0.4 10*3/uL (ref 0.1–1.0)
Monocytes Relative: 10 %
Neutro Abs: 2.8 10*3/uL (ref 1.7–7.7)
Neutrophils Relative %: 63 %
Platelet Count: 226 10*3/uL (ref 150–400)
RBC: 4.48 MIL/uL (ref 4.22–5.81)
RDW: 14 % (ref 11.5–15.5)
WBC Count: 4.4 10*3/uL (ref 4.0–10.5)
nRBC: 0 % (ref 0.0–0.2)

## 2021-06-11 LAB — AFP TUMOR MARKER: AFP, Serum, Tumor Marker: 72.4 ng/mL — ABNORMAL HIGH (ref 0.0–8.4)

## 2021-06-12 ENCOUNTER — Telehealth: Payer: Self-pay | Admitting: Hematology

## 2021-06-12 NOTE — Telephone Encounter (Signed)
Left message with follow-up appointments per 3/13 los. ?

## 2021-06-13 ENCOUNTER — Other Ambulatory Visit: Payer: Medicare HMO

## 2021-06-14 ENCOUNTER — Other Ambulatory Visit: Payer: Medicare HMO

## 2021-06-17 ENCOUNTER — Encounter: Payer: Self-pay | Admitting: Hematology

## 2021-06-17 MED ORDER — LIDOCAINE-PRILOCAINE 2.5-2.5 % EX CREA: TOPICAL_CREAM | CUTANEOUS | 3 refills | Status: DC

## 2021-06-17 MED ORDER — ONDANSETRON HCL 8 MG PO TABS: 8.0000 mg | ORAL_TABLET | Freq: Two times a day (BID) | ORAL | 1 refills | Status: AC | PRN

## 2021-06-17 MED ORDER — PROCHLORPERAZINE MALEATE 10 MG PO TABS: 10.0000 mg | ORAL_TABLET | Freq: Four times a day (QID) | ORAL | 1 refills | Status: AC | PRN

## 2021-06-17 NOTE — Progress Notes (Signed)
START OFF PATHWAY REGIMEN - Other   [id number]:Atezolizumab 1,200 mg IV D1 + Bevacizumab 15 mg/kg IV D1 q21 Days:   A cycle is every 21 Days:     Atezolizumab      Bevacizumab-xxxx   **Always confirm dose/schedule in your pharmacy ordering system**  Patient Characteristics: Intent of Therapy: Non-Curative / Palliative Intent, Discussed with Patient 

## 2021-06-17 NOTE — Addendum Note (Signed)
Addended by: Sullivan Lone on: 06/17/2021 01:19 AM ? ? Modules accepted: Orders ? ?

## 2021-06-17 NOTE — Progress Notes (Addendum)
? ? ?HEMATOLOGY/ONCOLOGY PHONE VISIT NOTE ? ?Date of Service: .06/10/2021 ? ? ?Patient Care Team: ?[name], MD as PCP - General (Family Medicine) ?Golden Circle, FNP as Nurse Practitioner (Family Medicine) ?[facility] as Consulting Physician ?Rozetta Nunnery, MD (Inactive) as Consulting Physician (Otolaryngology) ? ?CHIEF COMPLAINTS/PURPOSE OF CONSULTATION:  ?Follow-up for continued evaluation and management of hepatocellular carcinoma ? ?HISTORY OF PRESENTING ILLNESS:  ? ?Franklin Rivera is a wonderful 71 y.o. male who has been referred to [place] by Dr Aletta Edouard from the [facility] emergency room for evaluation and management of newly noted liver mass. ? ?Patient has a history of hepatitis C and presented to the emergency room in mid October 2022 with right upper quadrant abdominal pain.  He had an ultrasound of the abdomen which showed Solid mass in the right hepatic lobe measuring up to 7.9 cm. MRI of the abdomen with and without contrast is recommended for characterization. ? ?Patient subsequently had an MRI of the abdomen with and without contrast on 01/11/2021 which showed capsular based 5.8 x 4.8 by 5.2 cm lesion which demonstrates heterogeneous T2 hyperintensity. Arterial phase heterogeneous non rim enhancement. Delayed washout with capsular enhancement. highly suspicious for hepatocellular carcinoma. In this patient with chronic hepatitis C but no known cirrhosis, LI-RADS criteria do not strictly apply. If the patient does have known cirrhosis, this would be considered LR 5. No findings of abdominal metastatic disease. Small volume abdominal ascites. ? ?Patient notes he was diagnosed with hepatitis C genotype Ia with a fibrosis score of F3 and was treated with Epclusa for 12 weeks starting on 07/13/2019 with Mauricio Po FNP.  ?He is not aware of any plan that he had with infectious disease or primary care for [facility] screening with repeat ultrasounds prior to current  presentation. ? ?Patient notes that his abdominal pain is somewhat better currently.  He was given as needed Percocet on discharge from the emergency room but has not needed this much. ?Denies any significant weight loss in the last 3 to 6 months. ?Notes no other acute new focal symptoms. ?No jaundice. ?No change in the color of his urine. ?No change in bowel habits or color of his stools. ?Patient notes only social alcohol use and denies heavy alcohol abuse. ? ?INTERVAL HISTORY ?Patient is here for follow-up for continued evaluation and management of his locally advanced hepatocellular carcinoma. ?Since his last clinic he has been seen by Dr. Kathlene Cote and had TACE chemoembolization of large right lower hepatocellular carcinoma within 3 vascular territories supplying the tumor.  Drug-eluting beads loaded with a total of 150 mg of doxorubicin was utilized. ?Patient has no acute new symptoms.  No jaundice.  No abdominal pain or distention.  No change in bowel habits or stools. ? ?He had a follow-up imaging study including a CT of the abdomen on 05/24/2021 which showed interval progression of the dominant mass in the right hepatic lobe and new malignant lesion in the dome of the liver.  We discussed that the part of the volume increases due to central necrosis of the tumor. ? ?Systemic therapy was recommended by Dr. Kathlene Cote prior to attempts at additional local therapies including Y90 beads. ?We had a long discussion about different systemic therapy options available for management of locally advanced nonresectable hepatocellular carcinoma. ?We decided on atezolizumab plus Avastin and discussed the pros and cons of this.  He is agreeable to pursuing this and would like to be treated aggressively. ? ?MEDICAL HISTORY:  ?  Past Medical History:  ?Diagnosis Date  ? Allergy   ? Headache   ? Hyperlipidemia   ? ? ?SURGICAL HISTORY: ?Past Surgical History:  ?Procedure Laterality Date  ? BLADDER SURGERY    ? IR 3D INDEPENDENT  WKST  03/27/2021  ? IR ANGIOGRAM SELECTIVE EACH ADDITIONAL VESSEL  03/27/2021  ? IR ANGIOGRAM SELECTIVE EACH ADDITIONAL VESSEL  03/27/2021  ? IR ANGIOGRAM SELECTIVE EACH ADDITIONAL VESSEL  03/27/2021  ? IR ANGIOGRAM SELECTIVE EACH ADDITIONAL VESSEL  03/27/2021  ? IR ANGIOGRAM SELECTIVE EACH ADDITIONAL VESSEL  03/27/2021  ? IR ANGIOGRAM SELECTIVE EACH ADDITIONAL VESSEL  03/27/2021  ? IR ANGIOGRAM VISCERAL SELECTIVE  03/27/2021  ? IR EMBO TUMOR ORGAN ISCHEMIA INFARCT INC GUIDE ROADMAPPING  03/27/2021  ? IR RADIOLOGIST EVAL & MGMT  03/07/2021  ? IR RADIOLOGIST EVAL & MGMT  04/16/2021  ? IR RADIOLOGIST EVAL & MGMT  05/27/2021  ? IR US GUIDANCE  03/27/2021  ? NASAL FRACTURE SURGERY    ? Skull Surgery    ? ? ?SOCIAL HISTORY: ?Social History  ? ?Socioeconomic History  ? Marital status: Married  ?  Spouse name: Not on file  ? Number of children: Not on file  ? Years of education: Not on file  ? Highest education level: Not on file  ?Occupational History  ? Not on file  ?Tobacco Use  ? Smoking status: Never  ? Smokeless tobacco: Never  ?Vaping Use  ? Vaping Use: Never used  ?Substance and Sexual Activity  ? Alcohol use: Never  ? Drug use: Not Currently  ? Sexual activity: Not on file  ?Other Topics Concern  ? Not on file  ?Social History Narrative  ? Not on file  ? ?Social Determinants of Health  ? ?Financial Resource Strain: Low Risk   ? Difficulty of Paying Living Expenses: Not hard at all  ?Food Insecurity: No Food Insecurity  ? Worried About Charity fundraiser in the Last Year: Never true  ? Ran Out of Food in the Last Year: Never true  ?Transportation Needs: No Transportation Needs  ? Lack of Transportation (Medical): No  ? Lack of Transportation (Non-Medical): No  ?Physical Activity: Inactive  ? Days of Exercise per Week: 0 days  ? Minutes of Exercise per Session: 0 min  ?Stress: No Stress Concern Present  ? Feeling of Stress : Not at all  ?Social Connections: Moderately Isolated  ? Frequency of Communication with  Friends and Family: More than three times a week  ? Frequency of Social Gatherings with Friends and Family: More than three times a week  ? Attends Religious Services: Never  ? Active Member of Clubs or Organizations: No  ? Attends Archivist Meetings: Never  ? Marital Status: Married  ?Intimate Partner Violence: Not At Risk  ? Fear of Current or Ex-Partner: No  ? Emotionally Abused: No  ? Physically Abused: No  ? Sexually Abused: No  ? ? ?FAMILY HISTORY: ?Family History  ?Problem Relation Age of Onset  ? Cancer Neg Hx   ? ? ?ALLERGIES:  has No Known Allergies. ? ?MEDICATIONS:  ?Current Outpatient Medications  ?Medication Sig Dispense Refill  ? cetirizine (ZYRTEC) 10 MG tablet Take 1 tablet (10 mg total) by mouth daily. 90 tablet 0  ? HYDROcodone-acetaminophen (NORCO) 7.5-325 MG tablet Take 1 tablet by mouth as directed. 0.5-1 tablet(s) by mouth 1-3 times daily    ? hydrOXYzine (ATARAX/VISTARIL) 50 MG tablet Take 1 tablet (50 mg total) by mouth at bedtime as  needed (sleep). (Patient not taking: Reported on 06/10/2021) 90 tablet 0  ? tadalafil (CIALIS) 10 MG tablet Take 1 tablet (10 mg total) by mouth daily as needed for erectile dysfunction. (Patient not taking: Reported on 06/10/2021) 30 tablet 0  ? ?No current facility-administered medications for this visit.  ? ? ?REVIEW OF SYSTEMS:   ?10 Point review of Systems was done is negative except as noted above. ? ? ? ?PHYSICAL EXAMINATION:  ?.BP (!) 162/108 (BP Location: Left Arm, Patient Position: Sitting)   Pulse 88   Temp (!) 97.2 ?F (36.2 ?C) (Temporal)   Resp 17   Wt 193 lb 4 oz (87.7 kg)   SpO2 99%   BMI 26.21 kg/m?  ?NAD ?GENERAL:alert, in no acute distress and comfortable ?SKIN: no acute rashes, no significant lesions ?EYES: conjunctiva are pink and non-injected, sclera anicteric ?OROPHARYNX: MMM, no exudates, no oropharyngeal erythema or ulceration ?NECK: supple, no JVD ?LYMPH:  no palpable lymphadenopathy in the cervical, axillary or inguinal  regions ?LUNGS: clear to auscultation b/l with normal respiratory effort ?HEART: regular rate & rhythm ?ABDOMEN:  normoactive bowel sounds , non tender, not distended. ?Extremity: no pedal edema ?PSYCH: alert & oriented

## 2021-06-18 ENCOUNTER — Encounter: Payer: Self-pay | Admitting: Hematology

## 2021-06-18 ENCOUNTER — Other Ambulatory Visit: Payer: Self-pay

## 2021-06-18 ENCOUNTER — Inpatient Hospital Stay: Payer: Medicare HMO

## 2021-06-18 ENCOUNTER — Other Ambulatory Visit: Payer: Self-pay | Admitting: Hematology

## 2021-06-18 DIAGNOSIS — C220 Liver cell carcinoma: Secondary | ICD-10-CM

## 2021-06-18 NOTE — Progress Notes (Signed)
Received call from patient after receiving my call per my request. ? ?Introduced myself as Arboriculturist and to offer available resources. ? ?Discussed one-time $1000 Radio broadcast assistant to assist with personal expenses while going through treatment. Advised what is needed to apply. Patient will bring on 3/23 to have sent to me. He will complete paperwork on that day and receive a copy as well as expense sheet. He will call me to go over in detail at his earliest convenience after these steps have taken place. ? ?He has my card for any additional financial questions or concerns. ?

## 2021-06-20 ENCOUNTER — Inpatient Hospital Stay: Payer: Medicare HMO

## 2021-06-20 ENCOUNTER — Other Ambulatory Visit: Payer: Self-pay

## 2021-06-20 ENCOUNTER — Inpatient Hospital Stay: Payer: Medicare HMO | Admitting: Hematology

## 2021-06-20 VITALS — BP 145/90 | HR 92 | Temp 98.1°F | Resp 17 | Ht 72.0 in | Wt 190.5 lb

## 2021-06-20 DIAGNOSIS — C220 Liver cell carcinoma: Secondary | ICD-10-CM

## 2021-06-20 DIAGNOSIS — Z79899 Other long term (current) drug therapy: Secondary | ICD-10-CM | POA: Diagnosis not present

## 2021-06-20 DIAGNOSIS — Z5112 Encounter for antineoplastic immunotherapy: Secondary | ICD-10-CM | POA: Diagnosis not present

## 2021-06-20 DIAGNOSIS — Z8619 Personal history of other infectious and parasitic diseases: Secondary | ICD-10-CM | POA: Diagnosis not present

## 2021-06-20 LAB — CBC WITH DIFFERENTIAL (CANCER CENTER ONLY)
Abs Immature Granulocytes: 0.01 10*3/uL (ref 0.00–0.07)
Basophils Absolute: 0 10*3/uL (ref 0.0–0.1)
Basophils Relative: 1 %
Eosinophils Absolute: 0.2 10*3/uL (ref 0.0–0.5)
Eosinophils Relative: 3 %
HCT: 38.5 % — ABNORMAL LOW (ref 39.0–52.0)
Hemoglobin: 12.3 g/dL — ABNORMAL LOW (ref 13.0–17.0)
Immature Granulocytes: 0 %
Lymphocytes Relative: 22 %
Lymphs Abs: 1 10*3/uL (ref 0.7–4.0)
MCH: 27.6 pg (ref 26.0–34.0)
MCHC: 31.9 g/dL (ref 30.0–36.0)
MCV: 86.3 fL (ref 80.0–100.0)
Monocytes Absolute: 0.4 10*3/uL (ref 0.1–1.0)
Monocytes Relative: 9 %
Neutro Abs: 2.8 10*3/uL (ref 1.7–7.7)
Neutrophils Relative %: 65 %
Platelet Count: 227 10*3/uL (ref 150–400)
RBC: 4.46 MIL/uL (ref 4.22–5.81)
RDW: 13.9 % (ref 11.5–15.5)
WBC Count: 4.4 10*3/uL (ref 4.0–10.5)
nRBC: 0 % (ref 0.0–0.2)

## 2021-06-20 LAB — CMP (CANCER CENTER ONLY)
ALT: 39 U/L (ref 0–44)
AST: 39 U/L (ref 15–41)
Albumin: 3.8 g/dL (ref 3.5–5.0)
Alkaline Phosphatase: 144 U/L — ABNORMAL HIGH (ref 38–126)
Anion gap: 6 (ref 5–15)
BUN: 21 mg/dL (ref 8–23)
CO2: 31 mmol/L (ref 22–32)
Calcium: 9.6 mg/dL (ref 8.9–10.3)
Chloride: 99 mmol/L (ref 98–111)
Creatinine: 0.85 mg/dL (ref 0.61–1.24)
GFR, Estimated: >60 mL/min (ref >60)
Glucose, Bld: 90 mg/dL (ref 70–99)
Potassium: 4.4 mmol/L (ref 3.5–5.1)
Sodium: 136 mmol/L (ref 135–145)
Total Bilirubin: 0.4 mg/dL (ref 0.3–1.2)
Total Protein: 8.4 g/dL — ABNORMAL HIGH (ref 6.5–8.1)

## 2021-06-20 LAB — TOTAL PROTEIN, URINE DIPSTICK: Protein, ur: NEGATIVE mg/dL

## 2021-06-20 MED ORDER — SODIUM CHLORIDE 0.9 % IV SOLN
1200.0000 mg | Freq: Once | INTRAVENOUS | Status: AC
  Administered 2021-06-20: 1200 mg via INTRAVENOUS
  Filled 2021-06-20: qty 20

## 2021-06-20 MED ORDER — SODIUM CHLORIDE 0.9 % IV SOLN
15.0000 mg/kg | Freq: Once | INTRAVENOUS | Status: AC
  Administered 2021-06-20: 1300 mg via INTRAVENOUS
  Filled 2021-06-20: qty 48

## 2021-06-20 MED ORDER — SODIUM CHLORIDE 0.9 % IV SOLN: Freq: Once | INTRAVENOUS | Status: AC

## 2021-06-20 NOTE — Patient Instructions (Signed)
[facility]  Discharge Instructions: ?Thank you for choosing [facility] to provide your oncology and hematology care.  ? ?If you have a lab appointment with the [facility], please go directly to the [facility] and check in at the registration area. ?  ?Wear comfortable clothing and clothing appropriate for easy access to any Portacath or PICC line.  ? ?We strive to give you quality time with your provider. You may need to reschedule your appointment if you arrive late (15 or more minutes).  Arriving late affects you and other patients whose appointments are after yours.  Also, if you miss three or more appointments without notifying the office, you may be dismissed from the clinic at the provider?s discretion.    ?  ?For prescription refill requests, have your pharmacy contact our office and allow 72 hours for refills to be completed.   ? ?Today you received the following chemotherapy and/or immunotherapy agents: Baron Sane    ?  ?To help prevent nausea and vomiting after your treatment, we encourage you to take your nausea medication as directed. ? ?BELOW ARE SYMPTOMS THAT SHOULD BE REPORTED IMMEDIATELY: ?*FEVER GREATER THAN 100.4 F (38 ?C) OR HIGHER ?*CHILLS OR SWEATING ?*NAUSEA AND VOMITING THAT IS NOT CONTROLLED WITH YOUR NAUSEA MEDICATION ?*UNUSUAL SHORTNESS OF BREATH ?*UNUSUAL BRUISING OR BLEEDING ?*URINARY PROBLEMS (pain or burning when urinating, or frequent urination) ?*BOWEL PROBLEMS (unusual diarrhea, constipation, pain near the anus) ?TENDERNESS IN MOUTH AND THROAT WITH OR WITHOUT PRESENCE OF ULCERS (sore throat, sores in mouth, or a toothache) ?UNUSUAL RASH, SWELLING OR PAIN  ?UNUSUAL VAGINAL DISCHARGE OR ITCHING  ? ?Items with * indicate a potential emergency and should be followed up as soon as possible or go to the Emergency Department if any problems should occur. ? ?Please show the CHEMOTHERAPY ALERT CARD or IMMUNOTHERAPY ALERT CARD at  check-in to the Emergency Department and triage nurse. ? ?Should you have questions after your visit or need to cancel or reschedule your appointment, please contact [facility]  Dept: [phone]  and follow the prompts.  Office hours are 8:00 a.m. to 4:30 p.m. Monday - Friday. Please note that voicemails left after 4:00 p.m. may not be returned until the following business day.  We are closed weekends and major holidays. You have access to a nurse at all times for urgent questions. Please call the main number to the clinic Dept: [phone] and follow the prompts. ? ? ?For any non-urgent questions, you may also contact your provider using MyChart. We now offer e-Visits for anyone 36 and older to request care online for non-urgent symptoms. For details visit [email or web address]. ?  ?Also download the MyChart app! Go to the app store, search "MyChart", open the app, select [facility], and log in with your MyChart username and password. ? ?Due to Covid, a mask is required upon entering the hospital/clinic. If you do not have a mask, one will be given to you upon arrival. For doctor visits, patients may have 1 support person aged 59 or older with them. For treatment visits, patients cannot have anyone with them due to current Covid guidelines and our immunocompromised population.  ? ?Atezolizumab injection ?What is this medication? ?ATEZOLIZUMAB (a te zoe LIZ ue mab) is a monoclonal antibody. It is used to treat bladder cancer (urothelial cancer), liver cancer, lung cancer, and melanoma. ?This medicine may be used for other purposes; ask your health care provider or pharmacist if  you have questions. ?COMMON BRAND NAME(S): Tecentriq ?What should I tell my care team before I take this medication? ?They need to know if you have any of these conditions: ?autoimmune diseases like Crohn's disease, ulcerative colitis, or lupus ?have had or planning to have an allogeneic stem cell  transplant (uses someone else's stem cells) ?history of organ transplant ?history of radiation to the chest ?nervous system problems like myasthenia gravis or Guillain-Barre syndrome ?an unusual or allergic reaction to atezolizumab, other medicines, foods, dyes, or preservatives ?pregnant or trying to get pregnant ?breast-feeding ?How should I use this medication? ?This medicine is for infusion into a vein. It is given by a health care professional in a hospital or clinic setting. ?A special MedGuide will be given to you before each treatment. Be sure to read this information carefully each time. ?Talk to your pediatrician regarding the use of this medicine in children. Special care may be needed. ?Overdosage: If you think you have taken too much of this medicine contact a poison control center or emergency room at once. ?NOTE: This medicine is only for you. Do not share this medicine with others. ?What if I miss a dose? ?It is important not to miss your dose. Call your doctor or health care professional if you are unable to keep an appointment. ?What may interact with this medication? ?Interactions have not been studied. ?This list may not describe all possible interactions. Give your health care provider a list of all the medicines, herbs, non-prescription drugs, or dietary supplements you use. Also tell them if you smoke, drink alcohol, or use illegal drugs. Some items may interact with your medicine. ?What should I watch for while using this medication? ?Your condition will be monitored carefully while you are receiving this medicine. ?You may need blood work done while you are taking this medicine. ?Do not become pregnant while taking this medicine or for at least 5 months after stopping it. Women should inform their doctor if they wish to become pregnant or think they might be pregnant. There is a potential for serious side effects to an unborn child. Talk to your health care professional or pharmacist for  more information. Do not breast-feed an infant while taking this medicine or for at least 5 months after the last dose. ?What side effects may I notice from receiving this medication? ?Side effects that you should report to your doctor or health care professional as soon as possible: ?allergic reactions like skin rash, itching or hives, swelling of the face, lips, or tongue ?black, tarry stools ?bloody or watery diarrhea ?breathing problems ?changes in vision ?chest pain or chest tightness ?chills ?facial flushing ?fever ?headache ?signs and symptoms of high blood sugar such as dizziness; dry mouth; dry skin; fruity breath; nausea; stomach pain; increased hunger or thirst; increased urination ?signs and symptoms of liver injury like dark yellow or brown urine; general ill feeling or flu-like symptoms; light-colored stools; loss of appetite; nausea; right upper belly pain; unusually weak or tired; yellowing of the eyes or skin ?stomach pain ?trouble passing urine or change in the amount of urine ?Side effects that usually do not require medical attention (report to your doctor or health care professional if they continue or are bothersome): ?bone pain ?cough ?diarrhea ?joint pain ?muscle pain ?muscle weakness ?swelling of arms or legs ?tiredness ?weight loss ?This list may not describe all possible side effects. Call your doctor for medical advice about side effects. You may report side effects to FDA at 1-800-FDA-1088. ?Where  should I keep my medication? ?This drug is given in a hospital or clinic and will not be stored at home. ?NOTE: This sheet is a summary. It may not cover all possible information. If you have questions about this medicine, talk to your doctor, pharmacist, or health care provider. ?? 2022 Elsevier/Gold Standard (2020-12-04 00:00:00) ? ?Bevacizumab injection ?What is this medication? ?BEVACIZUMAB (be va SIZ yoo mab) is a monoclonal antibody. It is used to treat many types of cancer. ?This  medicine may be used for other purposes; ask your health care provider or pharmacist if you have questions. ?COMMON BRAND NAME(S): Alymsys, Avastin, MVASI, Zirabev ?What should I tell my care team before I take this medic

## 2021-06-20 NOTE — Progress Notes (Signed)
This encounter was created in error - please disregard.

## 2021-06-26 ENCOUNTER — Other Ambulatory Visit: Payer: Self-pay

## 2021-06-26 DIAGNOSIS — C220 Liver cell carcinoma: Secondary | ICD-10-CM

## 2021-06-27 ENCOUNTER — Encounter: Payer: Self-pay | Admitting: Hematology

## 2021-06-28 ENCOUNTER — Inpatient Hospital Stay (HOSPITAL_BASED_OUTPATIENT_CLINIC_OR_DEPARTMENT_OTHER): Payer: Medicare HMO | Admitting: Hematology

## 2021-06-28 ENCOUNTER — Other Ambulatory Visit: Payer: Self-pay | Admitting: Hematology

## 2021-06-28 ENCOUNTER — Other Ambulatory Visit: Payer: Self-pay

## 2021-06-28 ENCOUNTER — Inpatient Hospital Stay: Payer: Medicare HMO

## 2021-06-28 VITALS — BP 168/95 | HR 65 | Temp 97.9°F | Resp 18 | Ht 72.0 in | Wt 192.7 lb

## 2021-06-28 DIAGNOSIS — Z5112 Encounter for antineoplastic immunotherapy: Secondary | ICD-10-CM | POA: Diagnosis not present

## 2021-06-28 DIAGNOSIS — I158 Other secondary hypertension: Secondary | ICD-10-CM

## 2021-06-28 DIAGNOSIS — C220 Liver cell carcinoma: Secondary | ICD-10-CM

## 2021-06-28 DIAGNOSIS — Z8619 Personal history of other infectious and parasitic diseases: Secondary | ICD-10-CM | POA: Diagnosis not present

## 2021-06-28 DIAGNOSIS — Z79899 Other long term (current) drug therapy: Secondary | ICD-10-CM | POA: Diagnosis not present

## 2021-06-28 LAB — CBC WITH DIFFERENTIAL (CANCER CENTER ONLY)
Abs Immature Granulocytes: 0 10*3/uL (ref 0.00–0.07)
Basophils Absolute: 0 10*3/uL (ref 0.0–0.1)
Basophils Relative: 0 %
Eosinophils Absolute: 0.2 10*3/uL (ref 0.0–0.5)
Eosinophils Relative: 4 %
HCT: 37.3 % — ABNORMAL LOW (ref 39.0–52.0)
Hemoglobin: 12.1 g/dL — ABNORMAL LOW (ref 13.0–17.0)
Immature Granulocytes: 0 %
Lymphocytes Relative: 27 %
Lymphs Abs: 1 10*3/uL (ref 0.7–4.0)
MCH: 27.6 pg (ref 26.0–34.0)
MCHC: 32.4 g/dL (ref 30.0–36.0)
MCV: 85.2 fL (ref 80.0–100.0)
Monocytes Absolute: 0.4 10*3/uL (ref 0.1–1.0)
Monocytes Relative: 10 %
Neutro Abs: 2.2 10*3/uL (ref 1.7–7.7)
Neutrophils Relative %: 59 %
Platelet Count: 199 10*3/uL (ref 150–400)
RBC: 4.38 MIL/uL (ref 4.22–5.81)
RDW: 13.9 % (ref 11.5–15.5)
WBC Count: 3.7 10*3/uL — ABNORMAL LOW (ref 4.0–10.5)
nRBC: 0 % (ref 0.0–0.2)

## 2021-06-28 LAB — CMP (CANCER CENTER ONLY)
ALT: 51 U/L — ABNORMAL HIGH (ref 0–44)
AST: 51 U/L — ABNORMAL HIGH (ref 15–41)
Albumin: 3.5 g/dL (ref 3.5–5.0)
Alkaline Phosphatase: 133 U/L — ABNORMAL HIGH (ref 38–126)
Anion gap: 6 (ref 5–15)
BUN: 17 mg/dL (ref 8–23)
CO2: 30 mmol/L (ref 22–32)
Calcium: 9.1 mg/dL (ref 8.9–10.3)
Chloride: 102 mmol/L (ref 98–111)
Creatinine: 0.73 mg/dL (ref 0.61–1.24)
GFR, Estimated: >60 mL/min (ref >60)
Glucose, Bld: 99 mg/dL (ref 70–99)
Potassium: 3.9 mmol/L (ref 3.5–5.1)
Sodium: 138 mmol/L (ref 135–145)
Total Bilirubin: 0.5 mg/dL (ref 0.3–1.2)
Total Protein: 8 g/dL (ref 6.5–8.1)

## 2021-06-28 MED ORDER — LOSARTAN POTASSIUM 25 MG PO TABS: 25.0000 mg | ORAL_TABLET | Freq: Every day | ORAL | 1 refills | Status: DC

## 2021-07-01 ENCOUNTER — Encounter: Payer: Self-pay | Admitting: Hematology

## 2021-07-01 NOTE — Progress Notes (Signed)
Called patient to discuss grant expense sheet in detail. He verbalized understanding. ? ?Gift card placed in outgoing mail to patient for 3/31 visit. He was appreciative.  ?

## 2021-07-01 NOTE — Progress Notes (Signed)
Patient has completed the grant process. ? ?Patient approved for one-time $1000 [employer] grant to assist with personal expenses while going through treatment. He has a copy of the approval letter and expense sheet along with the Outpatient Pharmacy information.  ? ?He has my card for any additional financial questions or concerns.   ?

## 2021-07-02 ENCOUNTER — Telehealth: Payer: Self-pay | Admitting: Hematology

## 2021-07-02 NOTE — Telephone Encounter (Signed)
Left message with follow-up appointments per 3/31 los. ?

## 2021-07-05 ENCOUNTER — Encounter: Payer: Self-pay | Admitting: Hematology

## 2021-07-05 NOTE — Progress Notes (Addendum)
? ? ?HEMATOLOGY/ONCOLOGY PHONE VISIT NOTE ? ?Date of Service: .06/28/2021 ? ? ?Patient Care Team: ?Franklin Barrack, MD as PCP - General (Family Medicine) ?Golden Circle, FNP as Nurse Practitioner (Family Medicine) ?[facility] as Consulting Physician ?Rozetta Nunnery, MD (Inactive) as Consulting Physician (Otolaryngology) ? ?CHIEF COMPLAINTS/PURPOSE OF CONSULTATION:  ?Follow-up for hepatocellular carcinoma ? ?HISTORY OF PRESENTING ILLNESS:  ? ?Franklin Rivera is a wonderful 71 y.o. male who has been referred to [place] by Dr Aletta Edouard from the [facility] emergency room for evaluation and management of newly noted liver mass. ? ?Patient has a history of hepatitis C and presented to the emergency room in mid October 2022 with right upper quadrant abdominal pain.  He had an ultrasound of the abdomen which showed Solid mass in the right hepatic lobe measuring up to 7.9 cm. MRI of the abdomen with and without contrast is recommended for characterization. ? ?Patient subsequently had an MRI of the abdomen with and without contrast on 01/11/2021 which showed capsular based 5.8 x 4.8 by 5.2 cm lesion which demonstrates heterogeneous T2 hyperintensity. Arterial phase heterogeneous non rim enhancement. Delayed washout with capsular enhancement. highly suspicious for hepatocellular carcinoma. In this patient with chronic hepatitis C but no known cirrhosis, LI-RADS criteria do not strictly apply. If the patient does have known cirrhosis, this would be considered LR 5. No findings of abdominal metastatic disease. Small volume abdominal ascites. ? ?Patient notes he was diagnosed with hepatitis C genotype Ia with a fibrosis score of F3 and was treated with Epclusa for 12 weeks starting on 07/13/2019 with Mauricio Po FNP.  ?He is not aware of any plan that he had with infectious disease or primary care for [facility] screening with repeat ultrasounds prior to current presentation. ? ?Patient notes that his  abdominal pain is somewhat better currently.  He was given as needed Percocet on discharge from the emergency room but has not needed this much. ?Denies any significant weight loss in the last 3 to 6 months. ?Notes no other acute new focal symptoms. ?No jaundice. ?No change in the color of his urine. ?No change in bowel habits or color of his stools. ?Patient notes only social alcohol use and denies heavy alcohol abuse. ? ?INTERVAL HISTORY ? ?.I connected with [name] on 07/05/21 at 10:20 AM EDT by telephone visit and verified that I am speaking with the correct person using two identifiers.  ? ?I discussed the limitations, risks, security and privacy concerns of performing an evaluation and management service by telemedicine and the availability of in-person appointments. I also discussed with the patient that there may be a patient responsible charge related to this service. The patient expressed understanding and agreed to proceed.  ? ?Other persons participating in the visit and their role in the encounter: None ? ?Patient?s location: Home ?Provider?s location: [place] ? ?Chief Complaint: Follow-up for recently diagnosed hepatocellular carcinoma in the context of hepatitis C infection. ? ?[name] was called to follow-up on his care coordination for hepatocellular carcinoma.  He notes that he has been doing okay with no uncontrolled pain at this point.  He has been seen by hepatobiliary surgery Dr. Jeris Penta and had discussed in detail the pros and cons of surgical consideration after the use of transarterial treatments as a bridge to possible surgery. ? ?Patient notes that he has had a meeting with Dr. Aletta Edouard from interventional radiology and was offered the option of  transarterial Y 90 treatment with the plan to try to set this up before Christmas.  Patient notes he is waiting on an exact date for the procedure at this time. ? ?He notes no other acute new  symptoms. ?We discussed getting repeat labs for his hepatitis C viral load and he is agreeable to this.  We discussed that if he is still hepatitis C RNA viral load positive he might need to reconnect with Dr.Comer for consideration of repeat hepatitis C treatment. ? ?MEDICAL HISTORY:  ?Past Medical History:  ?Diagnosis Date  ? Allergy   ? Headache   ? Hyperlipidemia   ? ? ?SURGICAL HISTORY: ?Past Surgical History:  ?Procedure Laterality Date  ? BLADDER SURGERY    ? IR 3D INDEPENDENT WKST  03/27/2021  ? IR ANGIOGRAM SELECTIVE EACH ADDITIONAL VESSEL  03/27/2021  ? IR ANGIOGRAM SELECTIVE EACH ADDITIONAL VESSEL  03/27/2021  ? IR ANGIOGRAM SELECTIVE EACH ADDITIONAL VESSEL  03/27/2021  ? IR ANGIOGRAM SELECTIVE EACH ADDITIONAL VESSEL  03/27/2021  ? IR ANGIOGRAM SELECTIVE EACH ADDITIONAL VESSEL  03/27/2021  ? IR ANGIOGRAM SELECTIVE EACH ADDITIONAL VESSEL  03/27/2021  ? IR ANGIOGRAM VISCERAL SELECTIVE  03/27/2021  ? IR EMBO TUMOR ORGAN ISCHEMIA INFARCT INC GUIDE ROADMAPPING  03/27/2021  ? IR RADIOLOGIST EVAL & MGMT  03/07/2021  ? IR RADIOLOGIST EVAL & MGMT  04/16/2021  ? IR RADIOLOGIST EVAL & MGMT  05/27/2021  ? IR US GUIDANCE  03/27/2021  ? NASAL FRACTURE SURGERY    ? Skull Surgery    ? ? ?SOCIAL HISTORY: ?Social History  ? ?Socioeconomic History  ? Marital status: Married  ?  Spouse name: Not on file  ? Number of children: Not on file  ? Years of education: Not on file  ? Highest education level: Not on file  ?Occupational History  ? Not on file  ?Tobacco Use  ? Smoking status: Never  ? Smokeless tobacco: Never  ?Vaping Use  ? Vaping Use: Never used  ?Substance and Sexual Activity  ? Alcohol use: Never  ? Drug use: Not Currently  ? Sexual activity: Not on file  ?Other Topics Concern  ? Not on file  ?Social History Narrative  ? Not on file  ? ?Social Determinants of Health  ? ?Financial Resource Strain: Low Risk   ? Difficulty of Paying Living Expenses: Not hard at all  ?Food Insecurity: No Food Insecurity  ? Worried About  Charity fundraiser in the Last Year: Never true  ? Ran Out of Food in the Last Year: Never true  ?Transportation Needs: No Transportation Needs  ? Lack of Transportation (Medical): No  ? Lack of Transportation (Non-Medical): No  ?Physical Activity: Inactive  ? Days of Exercise per Week: 0 days  ? Minutes of Exercise per Session: 0 min  ?Stress: No Stress Concern Present  ? Feeling of Stress : Not at all  ?Social Connections: Moderately Isolated  ? Frequency of Communication with Friends and Family: More than three times a week  ? Frequency of Social Gatherings with Friends and Family: More than three times a week  ? Attends Religious Services: Never  ? Active Member of Clubs or Organizations: No  ? Attends Archivist Meetings: Never  ? Marital Status: Married  ?Intimate Partner Violence: Not At Risk  ? Fear of Current or Ex-Partner: No  ? Emotionally Abused: No  ? Physically Abused: No  ? Sexually Abused: No  ? ? ?FAMILY HISTORY: ?Family History  ?Problem Relation Age  of Onset  ? Cancer Neg Hx   ? ? ?ALLERGIES:  has No Known Allergies. ? ?MEDICATIONS:  ?Current Outpatient Medications  ?Medication Sig Dispense Refill  ? cetirizine (ZYRTEC) 10 MG tablet Take 1 tablet (10 mg total) by mouth daily. 90 tablet 0  ? HYDROcodone-acetaminophen (NORCO) 7.5-325 MG tablet Take 1 tablet by mouth as directed. 0.5-1 tablet(s) by mouth 1-3 times daily    ? hydrOXYzine (ATARAX/VISTARIL) 50 MG tablet Take 1 tablet (50 mg total) by mouth at bedtime as needed (sleep). (Patient not taking: Reported on 06/10/2021) 90 tablet 0  ? losartan (COZAAR) 25 MG tablet TAKE 1 TABLET(25 MG) BY MOUTH DAILY 90 tablet 0  ? ondansetron (ZOFRAN) 8 MG tablet Take 1 tablet (8 mg total) by mouth 2 (two) times daily as needed (Nausea or vomiting). 30 tablet 1  ? prochlorperazine (COMPAZINE) 10 MG tablet Take 1 tablet (10 mg total) by mouth every 6 (six) hours as needed (Nausea or vomiting). 30 tablet 1  ? tadalafil (CIALIS) 10 MG tablet Take 1  tablet (10 mg total) by mouth daily as needed for erectile dysfunction. (Patient not taking: Reported on 06/10/2021) 30 tablet 0  ? ?No current facility-administered medications for this visit.  ? ? ?REVIEW OF SYSTEM

## 2021-07-10 ENCOUNTER — Other Ambulatory Visit: Payer: Self-pay

## 2021-07-10 DIAGNOSIS — C220 Liver cell carcinoma: Secondary | ICD-10-CM

## 2021-07-11 ENCOUNTER — Other Ambulatory Visit: Payer: Self-pay

## 2021-07-11 ENCOUNTER — Inpatient Hospital Stay: Payer: Medicare HMO | Attending: Hematology

## 2021-07-11 ENCOUNTER — Inpatient Hospital Stay: Payer: Medicare HMO

## 2021-07-11 ENCOUNTER — Inpatient Hospital Stay (HOSPITAL_BASED_OUTPATIENT_CLINIC_OR_DEPARTMENT_OTHER): Payer: Medicare HMO | Admitting: Physician Assistant

## 2021-07-11 VITALS — BP 161/96 | HR 83 | Temp 98.1°F | Resp 20 | Wt 190.0 lb

## 2021-07-11 DIAGNOSIS — Z5112 Encounter for antineoplastic immunotherapy: Secondary | ICD-10-CM | POA: Diagnosis not present

## 2021-07-11 DIAGNOSIS — K7460 Unspecified cirrhosis of liver: Secondary | ICD-10-CM | POA: Diagnosis not present

## 2021-07-11 DIAGNOSIS — C220 Liver cell carcinoma: Secondary | ICD-10-CM | POA: Insufficient documentation

## 2021-07-11 DIAGNOSIS — Z79899 Other long term (current) drug therapy: Secondary | ICD-10-CM | POA: Insufficient documentation

## 2021-07-11 LAB — CBC WITH DIFFERENTIAL (CANCER CENTER ONLY)
Abs Immature Granulocytes: 0.01 10*3/uL (ref 0.00–0.07)
Basophils Absolute: 0 10*3/uL (ref 0.0–0.1)
Basophils Relative: 1 %
Eosinophils Absolute: 0.1 10*3/uL (ref 0.0–0.5)
Eosinophils Relative: 3 %
HCT: 40.2 % (ref 39.0–52.0)
Hemoglobin: 13.1 g/dL (ref 13.0–17.0)
Immature Granulocytes: 0 %
Lymphocytes Relative: 22 %
Lymphs Abs: 0.9 10*3/uL (ref 0.7–4.0)
MCH: 27.8 pg (ref 26.0–34.0)
MCHC: 32.6 g/dL (ref 30.0–36.0)
MCV: 85.4 fL (ref 80.0–100.0)
Monocytes Absolute: 0.3 10*3/uL (ref 0.1–1.0)
Monocytes Relative: 7 %
Neutro Abs: 2.8 10*3/uL (ref 1.7–7.7)
Neutrophils Relative %: 67 %
Platelet Count: 199 10*3/uL (ref 150–400)
RBC: 4.71 MIL/uL (ref 4.22–5.81)
RDW: 14.7 % (ref 11.5–15.5)
WBC Count: 4.2 10*3/uL (ref 4.0–10.5)
nRBC: 0 % (ref 0.0–0.2)

## 2021-07-11 LAB — CMP (CANCER CENTER ONLY)
ALT: 50 U/L — ABNORMAL HIGH (ref 0–44)
AST: 38 U/L (ref 15–41)
Albumin: 3.8 g/dL (ref 3.5–5.0)
Alkaline Phosphatase: 158 U/L — ABNORMAL HIGH (ref 38–126)
Anion gap: 7 (ref 5–15)
BUN: 18 mg/dL (ref 8–23)
CO2: 29 mmol/L (ref 22–32)
Calcium: 9.6 mg/dL (ref 8.9–10.3)
Chloride: 102 mmol/L (ref 98–111)
Creatinine: 0.81 mg/dL (ref 0.61–1.24)
GFR, Estimated: >60 mL/min (ref >60)
Glucose, Bld: 104 mg/dL — ABNORMAL HIGH (ref 70–99)
Potassium: 4.1 mmol/L (ref 3.5–5.1)
Sodium: 138 mmol/L (ref 135–145)
Total Bilirubin: 0.6 mg/dL (ref 0.3–1.2)
Total Protein: 8.6 g/dL — ABNORMAL HIGH (ref 6.5–8.1)

## 2021-07-11 LAB — SAVE SMEAR(SSMR), FOR PROVIDER SLIDE REVIEW

## 2021-07-11 LAB — TOTAL PROTEIN, URINE DIPSTICK: Protein, ur: <30 mg/dL — AB

## 2021-07-11 MED ORDER — SODIUM CHLORIDE 0.9 % IV SOLN
1200.0000 mg | Freq: Once | INTRAVENOUS | Status: AC
  Administered 2021-07-11: 1200 mg via INTRAVENOUS
  Filled 2021-07-11: qty 20

## 2021-07-11 MED ORDER — CETIRIZINE HCL 10 MG PO TABS
10.0000 mg | ORAL_TABLET | Freq: Every day | ORAL | 0 refills | Status: AC
Start: 2021-07-11 — End: ?

## 2021-07-11 MED ORDER — SODIUM CHLORIDE 0.9 % IV SOLN
15.0000 mg/kg | Freq: Once | INTRAVENOUS | Status: AC
  Administered 2021-07-11: 1300 mg via INTRAVENOUS
  Filled 2021-07-11: qty 48

## 2021-07-11 MED ORDER — SODIUM CHLORIDE 0.9 % IV SOLN: Freq: Once | INTRAVENOUS | Status: AC

## 2021-07-11 NOTE — Progress Notes (Signed)
Ok to treat today with elevated BP and urine protein <30 per Ardis Hughs. ?

## 2021-07-11 NOTE — Patient Instructions (Addendum)
[facility]  ? Discharge Instructions: ?Thank you for choosing [facility] to provide your oncology and hematology care.  ? ?If you have a lab appointment with the [facility], please go directly to the [facility] and check in at the registration area. ?  ?Wear comfortable clothing and clothing appropriate for easy access to any Portacath or PICC line.  ? ?We strive to give you quality time with your provider. You may need to reschedule your appointment if you arrive late (15 or more minutes).  Arriving late affects you and other patients whose appointments are after yours.  Also, if you miss three or more appointments without notifying the office, you may be dismissed from the clinic at the provider?s discretion.    ?  ?For prescription refill requests, have your pharmacy contact our office and allow 72 hours for refills to be completed.   ? ?Today you received the following chemotherapy and/or immunotherapy agents: atezolizumab and bevacizumab-bvzr.     ?  ?To help prevent nausea and vomiting after your treatment, we encourage you to take your nausea medication as directed. ? ?BELOW ARE SYMPTOMS THAT SHOULD BE REPORTED IMMEDIATELY: ?*FEVER GREATER THAN 100.4 F (38 ?C) OR HIGHER ?*CHILLS OR SWEATING ?*NAUSEA AND VOMITING THAT IS NOT CONTROLLED WITH YOUR NAUSEA MEDICATION ?*UNUSUAL SHORTNESS OF BREATH ?*UNUSUAL BRUISING OR BLEEDING ?*URINARY PROBLEMS (pain or burning when urinating, or frequent urination) ?*BOWEL PROBLEMS (unusual diarrhea, constipation, pain near the anus) ?TENDERNESS IN MOUTH AND THROAT WITH OR WITHOUT PRESENCE OF ULCERS (sore throat, sores in mouth, or a toothache) ?UNUSUAL RASH, SWELLING OR PAIN  ?UNUSUAL VAGINAL DISCHARGE OR ITCHING  ? ?Items with * indicate a potential emergency and should be followed up as soon as possible or go to the Emergency Department if any problems should occur. ? ?Please show the CHEMOTHERAPY ALERT CARD or  IMMUNOTHERAPY ALERT CARD at check-in to the Emergency Department and triage nurse. ? ?Should you have questions after your visit or need to cancel or reschedule your appointment, please contact [facility]  Dept: [phone]  and follow the prompts.  Office hours are 8:00 a.m. to 4:30 p.m. Monday - Friday. Please note that voicemails left after 4:00 p.m. may not be returned until the following business day.  We are closed weekends and major holidays. You have access to a nurse at all times for urgent questions. Please call the main number to the clinic Dept: [phone] and follow the prompts. ? ? ?For any non-urgent questions, you may also contact your provider using MyChart. We now offer e-Visits for anyone 31 and older to request care online for non-urgent symptoms. For details visit [email or web address]. ?  ?Also download the MyChart app! Go to the app store, search "MyChart", open the app, select [facility], and log in with your MyChart username and password. ? ?Due to Covid, a mask is required upon entering the hospital/clinic. If you do not have a mask, one will be given to you upon arrival. For doctor visits, patients may have 1 support person aged 72 or older with them. For treatment visits, patients cannot have anyone with them due to current Covid guidelines and our immunocompromised population.  ? ?

## 2021-07-11 NOTE — Progress Notes (Signed)
? ? ?HEMATOLOGY/ONCOLOGY PHONE VISIT NOTE ? ?Date of Service: 07/11/2021 ? ?Patient Care Team: ?[name], MD as PCP - General (Family Medicine) ?Golden Circle, FNP as Nurse Practitioner (Family Medicine) ?[facility] as Consulting Physician ?Rozetta Nunnery, MD (Inactive) as Consulting Physician (Otolaryngology) ? ?CHIEF COMPLAINTS/PURPOSE OF CONSULTATION:  ?Follow-up for hepatocellular carcinoma ? ?CURRENT TREATMENT: ?-06/20/2021: Cycle 1: Atezolizumab and Bevacizumab ?-07/11/2021: Cycle 2: Atezolizumab and Bevacizumab ? ? ?INTERVAL HISTORY ?Franklin Rivera returns for a follow-up for hepatocellular carcinoma.  He is due for cycle 2 of atezolizumab and bevacizumab.  He is accompanied by his wife for this visit.  He reports that after the first treatment he did experience some fatigue but he continues to complete his daily activities on his own.  He has a good appetite and denies any weight loss.  He denies any nausea, vomiting or abdominal pain.  His bowel habits are regular without any recurrent episodes of diarrhea or constipation.  He continues to have some right-sided flank pain and back pain.  He takes hydrocodone 7.5-325 mg tablet 3 times a day.  He is requesting his pain clinic to increase the dose to 10-325 mg a day as he does not need to take it as frequently.  He denies easy bruising or signs of active bleeding.  He denies any fevers, chills, night sweats, shortness of breath, chest pain or cough.  He has no other complaints.  ? ?MEDICAL HISTORY:  ?Past Medical History:  ?Diagnosis Date  ? Allergy   ? Headache   ? Hyperlipidemia   ? ? ?SURGICAL HISTORY: ?Past Surgical History:  ?Procedure Laterality Date  ? BLADDER SURGERY    ? IR 3D INDEPENDENT WKST  03/27/2021  ? IR ANGIOGRAM SELECTIVE EACH ADDITIONAL VESSEL  03/27/2021  ? IR ANGIOGRAM SELECTIVE EACH ADDITIONAL VESSEL  03/27/2021  ? IR ANGIOGRAM SELECTIVE EACH ADDITIONAL VESSEL  03/27/2021  ? IR ANGIOGRAM SELECTIVE  EACH ADDITIONAL VESSEL  03/27/2021  ? IR ANGIOGRAM SELECTIVE EACH ADDITIONAL VESSEL  03/27/2021  ? IR ANGIOGRAM SELECTIVE EACH ADDITIONAL VESSEL  03/27/2021  ? IR ANGIOGRAM VISCERAL SELECTIVE  03/27/2021  ? IR EMBO TUMOR ORGAN ISCHEMIA INFARCT INC GUIDE ROADMAPPING  03/27/2021  ? IR RADIOLOGIST EVAL & MGMT  03/07/2021  ? IR RADIOLOGIST EVAL & MGMT  04/16/2021  ? IR RADIOLOGIST EVAL & MGMT  05/27/2021  ? IR US GUIDANCE  03/27/2021  ? NASAL FRACTURE SURGERY    ? Skull Surgery    ? ? ?SOCIAL HISTORY: ?Social History  ? ?Socioeconomic History  ? Marital status: Married  ?  Spouse name: Not on file  ? Number of children: Not on file  ? Years of education: Not on file  ? Highest education level: Not on file  ?Occupational History  ? Not on file  ?Tobacco Use  ? Smoking status: Never  ? Smokeless tobacco: Never  ?Vaping Use  ? Vaping Use: Never used  ?Substance and Sexual Activity  ? Alcohol use: Never  ? Drug use: Not Currently  ? Sexual activity: Not on file  ?Other Topics Concern  ? Not on file  ?Social History Narrative  ? Not on file  ? ?Social Determinants of Health  ? ?Financial Resource Strain: Low Risk   ? Difficulty of Paying Living Expenses: Not hard at all  ?Food Insecurity: No Food Insecurity  ? Worried About Charity fundraiser in the Last Year: Never true  ? Ran Out of Food in the Last Year:  Never true  ?Transportation Needs: No Transportation Needs  ? Lack of Transportation (Medical): No  ? Lack of Transportation (Non-Medical): No  ?Physical Activity: Inactive  ? Days of Exercise per Week: 0 days  ? Minutes of Exercise per Session: 0 min  ?Stress: No Stress Concern Present  ? Feeling of Stress : Not at all  ?Social Connections: Moderately Isolated  ? Frequency of Communication with Friends and Family: More than three times a week  ? Frequency of Social Gatherings with Friends and Family: More than three times a week  ? Attends Religious Services: Never  ? Active Member of Clubs or Organizations: No  ? Attends  Archivist Meetings: Never  ? Marital Status: Married  ?Intimate Partner Violence: Not At Risk  ? Fear of Current or Ex-Partner: No  ? Emotionally Abused: No  ? Physically Abused: No  ? Sexually Abused: No  ? ? ?FAMILY HISTORY: ?Family History  ?Problem Relation Age of Onset  ? Cancer Neg Hx   ? ? ?ALLERGIES:  has No Known Allergies. ? ?MEDICATIONS:  ?Current Outpatient Medications  ?Medication Sig Dispense Refill  ? cetirizine (ZYRTEC) 10 MG tablet Take 1 tablet (10 mg total) by mouth daily. 90 tablet 0  ? HYDROcodone-acetaminophen (NORCO) 7.5-325 MG tablet Take 1 tablet by mouth as directed. 0.5-1 tablet(s) by mouth 1-3 times daily    ? losartan (COZAAR) 25 MG tablet TAKE 1 TABLET(25 MG) BY MOUTH DAILY 90 tablet 0  ? ondansetron (ZOFRAN) 8 MG tablet Take 1 tablet (8 mg total) by mouth 2 (two) times daily as needed (Nausea or vomiting). 30 tablet 1  ? prochlorperazine (COMPAZINE) 10 MG tablet Take 1 tablet (10 mg total) by mouth every 6 (six) hours as needed (Nausea or vomiting). 30 tablet 1  ? hydrOXYzine (ATARAX/VISTARIL) 50 MG tablet Take 1 tablet (50 mg total) by mouth at bedtime as needed (sleep). 90 tablet 0  ? tadalafil (CIALIS) 10 MG tablet Take 1 tablet (10 mg total) by mouth daily as needed for erectile dysfunction. (Patient not taking: Reported on 06/10/2021) 30 tablet 0  ? ?No current facility-administered medications for this visit.  ? ? ?REVIEW OF SYSTEMS:   ?10 Point review of Systems was done is negative except as noted above. ? ?PHYSICAL EXAMINATION:  ?Today's Vitals  ? 07/11/21 1202 07/11/21 1212  ?BP: (!) 161/96   ?Pulse: 83   ?Resp: 20   ?Temp: 98.1 ?F (36.7 ?C)   ?SpO2: 99%   ?Weight: 190 lb (86.2 kg)   ?PainSc:  2   ? ?Body mass index is 25.77 kg/m?. ? ?Constitutional: Oriented to person, place, and time and well-developed, well-nourished, and in no distress.  ?HENT:  ?Head: Normocephalic and atraumatic.  ?Eyes: Conjunctivae are normal. Right eye exhibits no discharge. Left eye  exhibits no discharge. No scleral icterus.  ?Neck: Normal range of motion. Neck supple.   ?Cardiovascular: Normal rate, regular rhythm, normal heart sounds and intact distal pulses.   ?Pulmonary/Chest: Effort normal and breath sounds normal. No respiratory distress. No wheezes. No rales.  ?Abdominal: Soft. Bowel sounds are normal. Exhibits no distension and no mass. There is no tenderness.  ?Musculoskeletal: Normal range of motion. Exhibits no edema.  ?Lymphadenopathy: No cervical adenopathy.  ?Neurological: Alert and oriented to person, place, and time. Exhibits normal muscle tone. Gait normal. Coordination normal.  ?Skin: Skin is warm and dry. No rash noted. Not diaphoretic. No erythema. No pallor.  ?Psychiatric: Mood, memory and judgment normal.  ? ? ?LABORATORY  DATA:  ?I have reviewed the data as listed ? ? ?  Latest Ref Rng & Units 07/11/2021  ? 11:33 AM 06/28/2021  ?  9:46 AM 06/20/2021  ?  8:53 AM  ?CBC  ?WBC 4.0 - 10.5 K/uL 4.2   3.7   4.4    ?Hemoglobin 13.0 - 17.0 g/dL 13.1   12.1   12.3    ?Hematocrit 39.0 - 52.0 % 40.2   37.3   38.5    ?Platelets 150 - 400 K/uL 199   199   227    ? ? ?. ? ?  Latest Ref Rng & Units 07/11/2021  ? 11:33 AM 06/28/2021  ?  9:46 AM 06/20/2021  ?  8:53 AM  ?CMP  ?Glucose 70 - 99 mg/dL 104   99   90    ?BUN 8 - 23 mg/dL '18   17   21    '$ ?Creatinine 0.61 - 1.24 mg/dL 0.81   0.73   0.85    ?Sodium 135 - 145 mmol/L 138   138   136    ?Potassium 3.5 - 5.1 mmol/L 4.1   3.9   4.4    ?Chloride 98 - 111 mmol/L 102   102   99    ?CO2 22 - 32 mmol/L '29   30   31    '$ ?Calcium 8.9 - 10.3 mg/dL 9.6   9.1   9.6    ?Total Protein 6.5 - 8.1 g/dL 8.6   8.0   8.4    ?Total Bilirubin 0.3 - 1.2 mg/dL 0.6   0.5   0.4    ?Alkaline Phos 38 - 126 U/L 158   133   144    ?AST 15 - 41 U/L 38   51   39    ?ALT 0 - 44 U/L 50   51   39    ? ?HCV FibroSURE ?Order: [id number] ?Status: Final result   ?Visible to patient: Yes (not seen)   ?Next appt: 02/05/2021 at 08:30 AM in Radiology ([facility])   ?Dx: Liver mass   ?0  Result Notes ?Component Ref Range & Units 10 d ago 1 yr ago  ?Fibrosis Score 0.00 - 0.21 0.80 High   0.72 R   ?Fibrosis Stage   Cirrhosis  F3   ?  ? ?RADIOGRAPHIC STUDIES: ?I have personally reviewed the radio

## 2021-07-12 DIAGNOSIS — Z6827 Body mass index (BMI) 27.0-27.9, adult: Secondary | ICD-10-CM | POA: Diagnosis not present

## 2021-07-12 DIAGNOSIS — M79671 Pain in right foot: Secondary | ICD-10-CM | POA: Diagnosis not present

## 2021-07-12 DIAGNOSIS — Z0130 Encounter for examination of blood pressure without abnormal findings: Secondary | ICD-10-CM | POA: Diagnosis not present

## 2021-07-12 DIAGNOSIS — Z79899 Other long term (current) drug therapy: Secondary | ICD-10-CM | POA: Diagnosis not present

## 2021-07-15 ENCOUNTER — Encounter: Payer: Self-pay | Admitting: Hematology

## 2021-07-31 ENCOUNTER — Other Ambulatory Visit: Payer: Self-pay

## 2021-07-31 DIAGNOSIS — C220 Liver cell carcinoma: Secondary | ICD-10-CM

## 2021-08-01 ENCOUNTER — Inpatient Hospital Stay: Payer: Medicare HMO

## 2021-08-01 ENCOUNTER — Inpatient Hospital Stay (HOSPITAL_BASED_OUTPATIENT_CLINIC_OR_DEPARTMENT_OTHER): Payer: Medicare HMO | Admitting: Hematology

## 2021-08-01 ENCOUNTER — Other Ambulatory Visit: Payer: Self-pay

## 2021-08-01 ENCOUNTER — Inpatient Hospital Stay: Payer: Medicare HMO | Attending: Hematology

## 2021-08-01 VITALS — BP 140/89 | HR 90 | Temp 97.2°F | Resp 20 | Wt 191.2 lb

## 2021-08-01 DIAGNOSIS — Z5111 Encounter for antineoplastic chemotherapy: Secondary | ICD-10-CM

## 2021-08-01 DIAGNOSIS — Z5112 Encounter for antineoplastic immunotherapy: Secondary | ICD-10-CM | POA: Insufficient documentation

## 2021-08-01 DIAGNOSIS — C220 Liver cell carcinoma: Secondary | ICD-10-CM

## 2021-08-01 DIAGNOSIS — K7460 Unspecified cirrhosis of liver: Secondary | ICD-10-CM

## 2021-08-01 LAB — CBC WITH DIFFERENTIAL (CANCER CENTER ONLY)
Abs Immature Granulocytes: 0.01 K/uL (ref 0.00–0.07)
Basophils Absolute: 0 K/uL (ref 0.0–0.1)
Basophils Relative: 1 %
Eosinophils Absolute: 0.1 K/uL (ref 0.0–0.5)
Eosinophils Relative: 3 %
HCT: 40.3 % (ref 39.0–52.0)
Hemoglobin: 13.3 g/dL (ref 13.0–17.0)
Immature Granulocytes: 0 %
Lymphocytes Relative: 24 %
Lymphs Abs: 0.9 K/uL (ref 0.7–4.0)
MCH: 28.1 pg (ref 26.0–34.0)
MCHC: 33 g/dL (ref 30.0–36.0)
MCV: 85 fL (ref 80.0–100.0)
Monocytes Absolute: 0.3 K/uL (ref 0.1–1.0)
Monocytes Relative: 7 %
Neutro Abs: 2.6 K/uL (ref 1.7–7.7)
Neutrophils Relative %: 65 %
Platelet Count: 192 K/uL (ref 150–400)
RBC: 4.74 MIL/uL (ref 4.22–5.81)
RDW: 14.7 % (ref 11.5–15.5)
WBC Count: 4 K/uL (ref 4.0–10.5)
nRBC: 0 % (ref 0.0–0.2)

## 2021-08-01 LAB — CMP (CANCER CENTER ONLY)
ALT: 72 U/L — ABNORMAL HIGH (ref 0–44)
AST: 56 U/L — ABNORMAL HIGH (ref 15–41)
Albumin: 3.7 g/dL (ref 3.5–5.0)
Alkaline Phosphatase: 165 U/L — ABNORMAL HIGH (ref 38–126)
Anion gap: 6 (ref 5–15)
BUN: 18 mg/dL (ref 8–23)
CO2: 27 mmol/L (ref 22–32)
Calcium: 9.2 mg/dL (ref 8.9–10.3)
Chloride: 103 mmol/L (ref 98–111)
Creatinine: 0.75 mg/dL (ref 0.61–1.24)
GFR, Estimated: >60 mL/min (ref >60)
Glucose, Bld: 106 mg/dL — ABNORMAL HIGH (ref 70–99)
Potassium: 4.1 mmol/L (ref 3.5–5.1)
Sodium: 136 mmol/L (ref 135–145)
Total Bilirubin: 0.5 mg/dL (ref 0.3–1.2)
Total Protein: 7.8 g/dL (ref 6.5–8.1)

## 2021-08-01 MED ORDER — SODIUM CHLORIDE 0.9 % IV SOLN: Freq: Once | INTRAVENOUS | Status: AC

## 2021-08-01 MED ORDER — SODIUM CHLORIDE 0.9 % IV SOLN
15.0000 mg/kg | Freq: Once | INTRAVENOUS | Status: AC
  Administered 2021-08-01: 1300 mg via INTRAVENOUS
  Filled 2021-08-01: qty 48

## 2021-08-01 MED ORDER — SODIUM CHLORIDE 0.9 % IV SOLN
1200.0000 mg | Freq: Once | INTRAVENOUS | Status: AC
  Administered 2021-08-01: 1200 mg via INTRAVENOUS
  Filled 2021-08-01: qty 20

## 2021-08-01 NOTE — Patient Instructions (Signed)
[facility]   ?Discharge Instructions: ?Thank you for choosing [facility] to provide your oncology and hematology care.  ? ?If you have a lab appointment with the [facility], please go directly to the [facility] and check in at the registration area. ?  ?Wear comfortable clothing and clothing appropriate for easy access to any Portacath or PICC line.  ? ?We strive to give you quality time with your provider. You may need to reschedule your appointment if you arrive late (15 or more minutes).  Arriving late affects you and other patients whose appointments are after yours.  Also, if you miss three or more appointments without notifying the office, you may be dismissed from the clinic at the provider?s discretion.    ?  ?For prescription refill requests, have your pharmacy contact our office and allow 72 hours for refills to be completed.   ? ?Today you received the following chemotherapy and/or immunotherapy agents: atezolizumab and bevacizumab-bvzr. ?  ?To help prevent nausea and vomiting after your treatment, we encourage you to take your nausea medication as directed. ? ?BELOW ARE SYMPTOMS THAT SHOULD BE REPORTED IMMEDIATELY: ?*FEVER GREATER THAN 100.4 F (38 ?C) OR HIGHER ?*CHILLS OR SWEATING ?*NAUSEA AND VOMITING THAT IS NOT CONTROLLED WITH YOUR NAUSEA MEDICATION ?*UNUSUAL SHORTNESS OF BREATH ?*UNUSUAL BRUISING OR BLEEDING ?*URINARY PROBLEMS (pain or burning when urinating, or frequent urination) ?*BOWEL PROBLEMS (unusual diarrhea, constipation, pain near the anus) ?TENDERNESS IN MOUTH AND THROAT WITH OR WITHOUT PRESENCE OF ULCERS (sore throat, sores in mouth, or a toothache) ?UNUSUAL RASH, SWELLING OR PAIN  ?UNUSUAL VAGINAL DISCHARGE OR ITCHING  ? ?Items with * indicate a potential emergency and should be followed up as soon as possible or go to the Emergency Department if any problems should occur. ? ?Please show the CHEMOTHERAPY ALERT CARD or IMMUNOTHERAPY  ALERT CARD at check-in to the Emergency Department and triage nurse. ? ?Should you have questions after your visit or need to cancel or reschedule your appointment, please contact [facility]  Dept: [phone]  and follow the prompts.  Office hours are 8:00 a.m. to 4:30 p.m. Monday - Friday. Please note that voicemails left after 4:00 p.m. may not be returned until the following business day.  We are closed weekends and major holidays. You have access to a nurse at all times for urgent questions. Please call the main number to the clinic Dept: [phone] and follow the prompts. ? ? ?For any non-urgent questions, you may also contact your provider using MyChart. We now offer e-Visits for anyone 94 and older to request care online for non-urgent symptoms. For details visit [email or web address]. ?  ?Also download the MyChart app! Go to the app store, search "MyChart", open the app, select [facility], and log in with your MyChart username and password. ? ?Due to Covid, a mask is required upon entering the hospital/clinic. If you do not have a mask, one will be given to you upon arrival. For doctor visits, patients may have 1 support person aged 37 or older with them. For treatment visits, patients cannot have anyone with them due to current Covid guidelines and our immunocompromised population.  ? ?

## 2021-08-07 ENCOUNTER — Telehealth: Payer: Self-pay | Admitting: Hematology

## 2021-08-07 NOTE — Telephone Encounter (Signed)
Left message with follow-up appointments per 5/4 los. ?

## 2021-08-08 ENCOUNTER — Encounter: Payer: Self-pay | Admitting: Hematology

## 2021-08-08 NOTE — Progress Notes (Signed)
? ? ?HEMATOLOGY/ONCOLOGY PHONE VISIT NOTE ? ?Date of Service: .08/01/2021 ? ? ?Patient Care Team: ?[name], MD as PCP - General (Family Medicine) ?Golden Circle, FNP as Nurse Practitioner (Family Medicine) ?[facility] as Consulting Physician ?Rozetta Nunnery, MD (Inactive) as Consulting Physician (Otolaryngology) ? ?CHIEF COMPLAINTS/PURPOSE OF CONSULTATION:  ?For continued evaluation and management of hepatocellular carcinoma and for follow-up prior to cycle 3 of Avastin/atezolizumab ? ?HISTORY OF PRESENTING ILLNESS:  ?Please see previous note for details on initial presentation ? ?INTERVAL HISTORY ? ?Patient is here for continued evaluation and management of his hepatocellular carcinoma and for follow-up prior to cycle 3 of atezolizumab/Avastin treatment. ?He notes no acute or notable toxicities from his previous treatment.  Good p.o. intake.  Minimal right upper quadrant tenderness but no significant pain.  No nausea no vomiting no jaundice. ?Blood pressures well controlled. ?No GI or urinary issues. ?Labs done today were discussed in detail with the patient. ? ? ? ?MEDICAL HISTORY:  ?Past Medical History:  ?Diagnosis Date  ? Allergy   ? Headache   ? Hyperlipidemia   ? ? ?SURGICAL HISTORY: ?Past Surgical History:  ?Procedure Laterality Date  ? BLADDER SURGERY    ? IR 3D INDEPENDENT WKST  03/27/2021  ? IR ANGIOGRAM SELECTIVE EACH ADDITIONAL VESSEL  03/27/2021  ? IR ANGIOGRAM SELECTIVE EACH ADDITIONAL VESSEL  03/27/2021  ? IR ANGIOGRAM SELECTIVE EACH ADDITIONAL VESSEL  03/27/2021  ? IR ANGIOGRAM SELECTIVE EACH ADDITIONAL VESSEL  03/27/2021  ? IR ANGIOGRAM SELECTIVE EACH ADDITIONAL VESSEL  03/27/2021  ? IR ANGIOGRAM SELECTIVE EACH ADDITIONAL VESSEL  03/27/2021  ? IR ANGIOGRAM VISCERAL SELECTIVE  03/27/2021  ? IR EMBO TUMOR ORGAN ISCHEMIA INFARCT INC GUIDE ROADMAPPING  03/27/2021  ? IR RADIOLOGIST EVAL & MGMT  03/07/2021  ? IR RADIOLOGIST EVAL & MGMT  04/16/2021  ? IR RADIOLOGIST  EVAL & MGMT  05/27/2021  ? IR US GUIDANCE  03/27/2021  ? NASAL FRACTURE SURGERY    ? Skull Surgery    ? ? ?SOCIAL HISTORY: ?Social History  ? ?Socioeconomic History  ? Marital status: Married  ?  Spouse name: Not on file  ? Number of children: Not on file  ? Years of education: Not on file  ? Highest education level: Not on file  ?Occupational History  ? Not on file  ?Tobacco Use  ? Smoking status: Never  ? Smokeless tobacco: Never  ?Vaping Use  ? Vaping Use: Never used  ?Substance and Sexual Activity  ? Alcohol use: Never  ? Drug use: Not Currently  ? Sexual activity: Not on file  ?Other Topics Concern  ? Not on file  ?Social History Narrative  ? Not on file  ? ?Social Determinants of Health  ? ?Financial Resource Strain: Low Risk   ? Difficulty of Paying Living Expenses: Not hard at all  ?Food Insecurity: No Food Insecurity  ? Worried About Charity fundraiser in the Last Year: Never true  ? Ran Out of Food in the Last Year: Never true  ?Transportation Needs: No Transportation Needs  ? Lack of Transportation (Medical): No  ? Lack of Transportation (Non-Medical): No  ?Physical Activity: Inactive  ? Days of Exercise per Week: 0 days  ? Minutes of Exercise per Session: 0 min  ?Stress: No Stress Concern Present  ? Feeling of Stress : Not at all  ?Social Connections: Moderately Isolated  ? Frequency of Communication with Friends and Family: More than three times a week  ?  Frequency of Social Gatherings with Friends and Family: More than three times a week  ? Attends Religious Services: Never  ? Active Member of Clubs or Organizations: No  ? Attends Archivist Meetings: Never  ? Marital Status: Married  ?Intimate Partner Violence: Not At Risk  ? Fear of Current or Ex-Partner: No  ? Emotionally Abused: No  ? Physically Abused: No  ? Sexually Abused: No  ? ? ?FAMILY HISTORY: ?Family History  ?Problem Relation Age of Onset  ? Cancer Neg Hx   ? ? ?ALLERGIES:  has No Known Allergies. ? ?MEDICATIONS:  ?Current  Outpatient Medications  ?Medication Sig Dispense Refill  ? cetirizine (ZYRTEC) 10 MG tablet Take 1 tablet (10 mg total) by mouth daily. 90 tablet 0  ? HYDROcodone-acetaminophen (NORCO) 7.5-325 MG tablet Take 1 tablet by mouth as directed. 0.5-1 tablet(s) by mouth 1-3 times daily    ? losartan (COZAAR) 25 MG tablet TAKE 1 TABLET(25 MG) BY MOUTH DAILY 90 tablet 0  ? ondansetron (ZOFRAN) 8 MG tablet Take 1 tablet (8 mg total) by mouth 2 (two) times daily as needed (Nausea or vomiting). 30 tablet 1  ? prochlorperazine (COMPAZINE) 10 MG tablet Take 1 tablet (10 mg total) by mouth every 6 (six) hours as needed (Nausea or vomiting). 30 tablet 1  ? losartan (COZAAR) 50 MG tablet Take by mouth.    ? ?No current facility-administered medications for this visit.  ? ? ?REVIEW OF SYSTEMS:   ?10 Point review of Systems was done is negative except as noted above. ? ?PHYSICAL EXAMINATION:  ?.BP 140/89   Pulse 90   Temp (!) 97.2 ?F (36.2 ?C)   Resp 20   Wt 191 lb 3.2 oz (86.7 kg)   SpO2 100%   BMI 25.93 kg/m?  ?. ?GENERAL:alert, in no acute distress and comfortable ?SKIN: no acute rashes, no significant lesions ?EYES: conjunctiva are pink and non-injected, sclera anicteric ?OROPHARYNX: MMM, no exudates, no oropharyngeal erythema or ulceration ?NECK: supple, no JVD ?LYMPH:  no palpable lymphadenopathy in the cervical, axillary or inguinal regions ?LUNGS: clear to auscultation b/l with normal respiratory effort ?HEART: regular rate & rhythm ?ABDOMEN:  normoactive bowel sounds , non tender, not distended. ?Extremity: no pedal edema ?PSYCH: alert & oriented x 3 with fluent speech ?NEURO: no focal motor/sensory deficits ? ? ?LABORATORY DATA:  ?I have reviewed the data as listed ? ?. ? ?  Latest Ref Rng & Units 08/01/2021  ? 12:41 PM 07/11/2021  ? 11:33 AM 06/28/2021  ?  9:46 AM  ?CBC  ?WBC 4.0 - 10.5 K/uL 4.0   4.2   3.7    ?Hemoglobin 13.0 - 17.0 g/dL 13.3   13.1   12.1    ?Hematocrit 39.0 - 52.0 % 40.3   40.2   37.3    ?Platelets 150  - 400 K/uL 192   199   199    ? ? ?. ? ?  Latest Ref Rng & Units 08/01/2021  ? 12:41 PM 07/11/2021  ? 11:33 AM 06/28/2021  ?  9:46 AM  ?CMP  ?Glucose 70 - 99 mg/dL 106   104   99    ?BUN 8 - 23 mg/dL '18   18   17    '$ ?Creatinine 0.61 - 1.24 mg/dL 0.75   0.81   0.73    ?Sodium 135 - 145 mmol/L 136   138   138    ?Potassium 3.5 - 5.1 mmol/L 4.1   4.1  3.9    ?Chloride 98 - 111 mmol/L 103   102   102    ?CO2 22 - 32 mmol/L '27   29   30    '$ ?Calcium 8.9 - 10.3 mg/dL 9.2   9.6   9.1    ?Total Protein 6.5 - 8.1 g/dL 7.8   8.6   8.0    ?Total Bilirubin 0.3 - 1.2 mg/dL 0.5   0.6   0.5    ?Alkaline Phos 38 - 126 U/L 165   158   133    ?AST 15 - 41 U/L 56   38   51    ?ALT 0 - 44 U/L 72   50   51    ? ?HCV FibroSURE ?Order: [id number] ?Status: Final result   ?Visible to patient: Yes (not seen)   ?Next appt: 02/05/2021 at 08:30 AM in Radiology ([facility])   ?Dx: Liver mass   ?0 Result Notes ?Component Ref Range & Units 10 d ago 1 yr ago  ?Fibrosis Score 0.00 - 0.21 0.80 High   0.72 R   ?Fibrosis Stage   Cirrhosis  F3   ?  ? ?Component ?    Latest Ref Rng & Units 01/21/2021  ?Prothrombin Time ?    11.4 - 15.2 seconds 13.2  ?INR ?    0.8 - 1.2 1.0  ?AFP, Serum, Tumor Marker ?    0.0 - 8.4 ng/mL 28.9 (H)  ?CEA ([facility]) ?    0.00 - 5.00 ng/mL 1.49  ?Prostate Specific Ag, Serum ?    0.0 - 4.0 ng/mL 1.0  ?CA 19-9 ?    0 - 35 U/mL 21  ? ?RADIOGRAPHIC STUDIES: ?I have personally reviewed the radiological images as listed and agreed with the findings in the report. ?No results found. ? ?ASSESSMENT & PLAN:  ? ?71 year old male with history of hepatitis C with recently diagnosed hepatocellular carcinoma. ? ?1) New right hepatic lobe 7.9 cm mass concerning for Hepatocellular carcinoma. ?Told he does not have overt cirrhotic morphology on MRI he previously has had a fibrosis score of F3. ?MRI is LIRADS 5 concern for hepatocellular carcinoma. ?Component ?    Latest Ref Rng & Units 01/21/2021  ?Prothrombin Time ?    11.4 - 15.2 seconds 13.2   ?INR ?    0.8 - 1.2 1.0  ?AFP, Serum, Tumor Marker ?    0.0 - 8.4 ng/mL 28.9 (H)  ?CEA ([facility]) ?    0.00 - 5.00 ng/mL 1.49  ?Prostate Specific Ag, Serum ?    0.0 - 4.0 ng/mL 1.0  ?CA 19-9 ?    0 - 35 U/mL

## 2021-08-09 DIAGNOSIS — R030 Elevated blood-pressure reading, without diagnosis of hypertension: Secondary | ICD-10-CM | POA: Diagnosis not present

## 2021-08-09 DIAGNOSIS — M79671 Pain in right foot: Secondary | ICD-10-CM | POA: Diagnosis not present

## 2021-08-09 DIAGNOSIS — C220 Liver cell carcinoma: Secondary | ICD-10-CM | POA: Diagnosis not present

## 2021-08-09 DIAGNOSIS — Z6827 Body mass index (BMI) 27.0-27.9, adult: Secondary | ICD-10-CM | POA: Diagnosis not present

## 2021-08-09 DIAGNOSIS — Z131 Encounter for screening for diabetes mellitus: Secondary | ICD-10-CM | POA: Diagnosis not present

## 2021-08-09 DIAGNOSIS — Z79899 Other long term (current) drug therapy: Secondary | ICD-10-CM | POA: Diagnosis not present

## 2021-08-13 ENCOUNTER — Other Ambulatory Visit: Payer: Self-pay

## 2021-08-13 DIAGNOSIS — C220 Liver cell carcinoma: Secondary | ICD-10-CM

## 2021-08-15 ENCOUNTER — Other Ambulatory Visit: Payer: Self-pay

## 2021-08-15 ENCOUNTER — Inpatient Hospital Stay: Payer: Medicare HMO

## 2021-08-16 ENCOUNTER — Telehealth: Payer: Self-pay | Admitting: Family Medicine

## 2021-08-16 ENCOUNTER — Ambulatory Visit (HOSPITAL_COMMUNITY)
Admission: RE | Admit: 2021-08-16 | Discharge: 2021-08-16 | Disposition: A | Discharge status: Home / Self Care | Payer: Medicare HMO | Source: Ambulatory Visit | Attending: Hematology | Admitting: Hematology

## 2021-08-16 DIAGNOSIS — D490 Neoplasm of unspecified behavior of digestive system: Secondary | ICD-10-CM | POA: Diagnosis not present

## 2021-08-16 DIAGNOSIS — K449 Diaphragmatic hernia without obstruction or gangrene: Secondary | ICD-10-CM | POA: Diagnosis not present

## 2021-08-16 DIAGNOSIS — C220 Liver cell carcinoma: Secondary | ICD-10-CM

## 2021-08-16 DIAGNOSIS — N281 Cyst of kidney, acquired: Secondary | ICD-10-CM | POA: Diagnosis not present

## 2021-08-16 MED ORDER — GADOBUTROL 1 MMOL/ML IV SOLN
8.0000 mL | Freq: Once | INTRAVENOUS | Status: AC | PRN
  Administered 2021-08-16: 8 mL via INTRAVENOUS

## 2021-08-16 NOTE — Telephone Encounter (Signed)
Copied from [facility] [phone]. Topic: Medicare AWV >> Aug 16, 2021 11:39 AM Harris-Coley, Hannah Beat wrote: Reason for CRM: LVM 08/16/21 to r/s AWV due to holiday.  AWV r/s 08/27/21 at 8am.  Please confirm AWV appt  [email or web address]

## 2021-08-19 ENCOUNTER — Ambulatory Visit (HOSPITAL_COMMUNITY): Payer: Medicare HMO

## 2021-08-22 ENCOUNTER — Inpatient Hospital Stay: Payer: Medicare HMO

## 2021-08-22 ENCOUNTER — Encounter: Payer: Self-pay | Admitting: Hematology

## 2021-08-22 ENCOUNTER — Other Ambulatory Visit: Payer: Self-pay

## 2021-08-22 ENCOUNTER — Inpatient Hospital Stay (HOSPITAL_BASED_OUTPATIENT_CLINIC_OR_DEPARTMENT_OTHER): Payer: Medicare HMO | Admitting: Hematology

## 2021-08-22 VITALS — BP 142/89 | HR 82 | Temp 98.1°F | Resp 18

## 2021-08-22 DIAGNOSIS — Z7189 Other specified counseling: Secondary | ICD-10-CM

## 2021-08-22 DIAGNOSIS — C220 Liver cell carcinoma: Secondary | ICD-10-CM

## 2021-08-22 DIAGNOSIS — Z5111 Encounter for antineoplastic chemotherapy: Secondary | ICD-10-CM

## 2021-08-22 DIAGNOSIS — Z5112 Encounter for antineoplastic immunotherapy: Secondary | ICD-10-CM | POA: Diagnosis not present

## 2021-08-22 LAB — CMP (CANCER CENTER ONLY)
ALT: 94 U/L — ABNORMAL HIGH (ref 0–44)
AST: 67 U/L — ABNORMAL HIGH (ref 15–41)
Albumin: 3.7 g/dL (ref 3.5–5.0)
Alkaline Phosphatase: 205 U/L — ABNORMAL HIGH (ref 38–126)
Anion gap: 8 (ref 5–15)
BUN: 21 mg/dL (ref 8–23)
CO2: 25 mmol/L (ref 22–32)
Calcium: 9.1 mg/dL (ref 8.9–10.3)
Chloride: 104 mmol/L (ref 98–111)
Creatinine: 0.78 mg/dL (ref 0.61–1.24)
GFR, Estimated: >60 mL/min (ref >60)
Glucose, Bld: 120 mg/dL — ABNORMAL HIGH (ref 70–99)
Potassium: 4.1 mmol/L (ref 3.5–5.1)
Sodium: 137 mmol/L (ref 135–145)
Total Bilirubin: 0.4 mg/dL (ref 0.3–1.2)
Total Protein: 8 g/dL (ref 6.5–8.1)

## 2021-08-22 LAB — CBC WITH DIFFERENTIAL (CANCER CENTER ONLY)
Abs Immature Granulocytes: 0 10*3/uL (ref 0.00–0.07)
Basophils Absolute: 0 10*3/uL (ref 0.0–0.1)
Basophils Relative: 0 %
Eosinophils Absolute: 0.1 10*3/uL (ref 0.0–0.5)
Eosinophils Relative: 3 %
HCT: 40.4 % (ref 39.0–52.0)
Hemoglobin: 13.3 g/dL (ref 13.0–17.0)
Immature Granulocytes: 0 %
Lymphocytes Relative: 23 %
Lymphs Abs: 1.1 10*3/uL (ref 0.7–4.0)
MCH: 28.2 pg (ref 26.0–34.0)
MCHC: 32.9 g/dL (ref 30.0–36.0)
MCV: 85.6 fL (ref 80.0–100.0)
Monocytes Absolute: 0.3 10*3/uL (ref 0.1–1.0)
Monocytes Relative: 7 %
Neutro Abs: 3.3 10*3/uL (ref 1.7–7.7)
Neutrophils Relative %: 67 %
Platelet Count: 214 10*3/uL (ref 150–400)
RBC: 4.72 MIL/uL (ref 4.22–5.81)
RDW: 15.1 % (ref 11.5–15.5)
WBC Count: 4.9 10*3/uL (ref 4.0–10.5)
nRBC: 0 % (ref 0.0–0.2)

## 2021-08-22 LAB — TOTAL PROTEIN, URINE DIPSTICK: Protein, ur: <30 mg/dL — AB

## 2021-08-22 MED ORDER — SODIUM CHLORIDE 0.9 % IV SOLN: Freq: Once | INTRAVENOUS | Status: AC

## 2021-08-22 MED ORDER — SODIUM CHLORIDE 0.9 % IV SOLN
15.0000 mg/kg | Freq: Once | INTRAVENOUS | Status: AC
  Administered 2021-08-22: 1300 mg via INTRAVENOUS
  Filled 2021-08-22: qty 48

## 2021-08-22 MED ORDER — SODIUM CHLORIDE 0.9 % IV SOLN
1200.0000 mg | Freq: Once | INTRAVENOUS | Status: AC
  Administered 2021-08-22: 1200 mg via INTRAVENOUS
  Filled 2021-08-22: qty 20

## 2021-08-22 NOTE — Patient Instructions (Signed)
[facility] ONCOLOGY  Discharge Instructions: Thank you for choosing [facility] to provide your oncology and hematology care.   If you have a lab appointment with the [facility], please go directly to the [facility] and check in at the registration area.   Wear comfortable clothing and clothing appropriate for easy access to any Portacath or PICC line.   We strive to give you quality time with your provider. You may need to reschedule your appointment if you arrive late (15 or more minutes).  Arriving late affects you and other patients whose appointments are after yours.  Also, if you miss three or more appointments without notifying the office, you may be dismissed from the clinic at the provider's discretion.      For prescription refill requests, have your pharmacy contact our office and allow 72 hours for refills to be completed.    Today you received the following chemotherapy and/or immunotherapy agents: [name], [name]      To help prevent nausea and vomiting after your treatment, we encourage you to take your nausea medication as directed.  BELOW ARE SYMPTOMS THAT SHOULD BE REPORTED IMMEDIATELY: *FEVER GREATER THAN 100.4 F (38 C) OR HIGHER *CHILLS OR SWEATING *NAUSEA AND VOMITING THAT IS NOT CONTROLLED WITH YOUR NAUSEA MEDICATION *UNUSUAL SHORTNESS OF BREATH *UNUSUAL BRUISING OR BLEEDING *URINARY PROBLEMS (pain or burning when urinating, or frequent urination) *BOWEL PROBLEMS (unusual diarrhea, constipation, pain near the anus) TENDERNESS IN MOUTH AND THROAT WITH OR WITHOUT PRESENCE OF ULCERS (sore throat, sores in mouth, or a toothache) UNUSUAL RASH, SWELLING OR PAIN  UNUSUAL VAGINAL DISCHARGE OR ITCHING   Items with * indicate a potential emergency and should be followed up as soon as possible or go to the Emergency Department if any problems should occur.  Please show the CHEMOTHERAPY ALERT CARD or IMMUNOTHERAPY ALERT CARD at  check-in to the Emergency Department and triage nurse.  Should you have questions after your visit or need to cancel or reschedule your appointment, please contact [facility]  Dept: [phone]  and follow the prompts.  Office hours are 8:00 a.m. to 4:30 p.m. Monday - Friday. Please note that voicemails left after 4:00 p.m. may not be returned until the following business day.  We are closed weekends and major holidays. You have access to a nurse at all times for urgent questions. Please call the main number to the clinic Dept: [phone] and follow the prompts.   For any non-urgent questions, you may also contact your provider using MyChart. We now offer e-Visits for anyone 36 and older to request care online for non-urgent symptoms. For details visit [email or web address].   Also download the MyChart app! Go to the app store, search "MyChart", open the app, select [facility], and log in with your MyChart username and password.  Due to Covid, a mask is required upon entering the hospital/clinic. If you do not have a mask, one will be given to you upon arrival. For doctor visits, patients may have 1 support person aged 38 or older with them. For treatment visits, patients cannot have anyone with them due to current Covid guidelines and our immunocompromised population.

## 2021-08-22 NOTE — Progress Notes (Signed)
Per Dr. Irene Limbo, ok to proceed without urine protein today.  Pt to give sample prior to leaving infusion

## 2021-08-23 LAB — AFP TUMOR MARKER: AFP, Serum, Tumor Marker: 181 ng/mL — ABNORMAL HIGH (ref 0.0–8.4)

## 2021-08-26 ENCOUNTER — Ambulatory Visit: Payer: Medicare HMO

## 2021-08-27 ENCOUNTER — Ambulatory Visit (INDEPENDENT_AMBULATORY_CARE_PROVIDER_SITE_OTHER): Payer: Medicare HMO

## 2021-08-27 DIAGNOSIS — Z0000 Encounter for general adult medical examination without abnormal findings: Secondary | ICD-10-CM | POA: Diagnosis not present

## 2021-08-27 NOTE — Patient Instructions (Signed)
[name] , Thank you for taking time to come for your Medicare Wellness Visit. I appreciate your ongoing commitment to your health goals. Please review the following plan we discussed and let me know if I can assist you in the future.   Screening recommendations/referrals: Colonoscopy: 06/06/19 repeat every 3 years  Recommended yearly ophthalmology/optometry visit for glaucoma screening and checkup Recommended yearly dental visit for hygiene and checkup  Vaccinations: Influenza vaccine: declined and discussed  Pneumococcal vaccine: declined  Tdap vaccine: Done 12/20/14 repeat every 10 years  Shingles vaccine: Shingrix discussed. Please contact your pharmacy for coverage information.    Covid-19: declined and discussed   Advanced directives: Advance directive discussed with you today. Even though you declined this today please call our office should you change your mind and we can give you the proper paperwork for you to fill out.  Conditions/risks identified: none at this time   Next appointment: Follow up in one year for your annual wellness visit.   Preventive Care 36 Years and Older, Male Preventive care refers to lifestyle choices and visits with your health care provider that can promote health and wellness. What does preventive care include? A yearly physical exam. This is also called an annual well check. Dental exams once or twice a year. Routine eye exams. Ask your health care provider how often you should have your eyes checked. Personal lifestyle choices, including: Daily care of your teeth and gums. Regular physical activity. Eating a healthy diet. Avoiding tobacco and drug use. Limiting alcohol use. Practicing safe sex. Taking low doses of aspirin every day. Taking vitamin and mineral supplements as recommended by your health care provider. What happens during an annual well check? The services and screenings done by your health care provider during your annual well  check will depend on your age, overall health, lifestyle risk factors, and family history of disease. Counseling  Your health care provider may ask you questions about your: Alcohol use. Tobacco use. Drug use. Emotional well-being. Home and relationship well-being. Sexual activity. Eating habits. History of falls. Memory and ability to understand (cognition). Work and work Statistician. Screening  You may have the following tests or measurements: Height, weight, and BMI. Blood pressure. Lipid and cholesterol levels. These may be checked every 5 years, or more frequently if you are over 70 years old. Skin check. Lung cancer screening. You may have this screening every year starting at age 1 if you have a 30-pack-year history of smoking and currently smoke or have quit within the past 15 years. Fecal occult blood test (FOBT) of the stool. You may have this test every year starting at age 42. Flexible sigmoidoscopy or colonoscopy. You may have a sigmoidoscopy every 5 years or a colonoscopy every 10 years starting at age 19. Prostate cancer screening. Recommendations will vary depending on your family history and other risks. Hepatitis C blood test. Hepatitis B blood test. Sexually transmitted disease (STD) testing. Diabetes screening. This is done by checking your blood sugar (glucose) after you have not eaten for a while (fasting). You may have this done every 1-3 years. Abdominal aortic aneurysm (AAA) screening. You may need this if you are a current or former smoker. Osteoporosis. You may be screened starting at age 85 if you are at high risk. Talk with your health care provider about your test results, treatment options, and if necessary, the need for more tests. Vaccines  Your health care provider may recommend certain vaccines, such as: Influenza vaccine. This is  recommended every year. Tetanus, diphtheria, and acellular pertussis (Tdap, Td) vaccine. You may need a Td booster  every 10 years. Zoster vaccine. You may need this after age 9. Pneumococcal 13-valent conjugate (PCV13) vaccine. One dose is recommended after age 59. Pneumococcal polysaccharide (PPSV23) vaccine. One dose is recommended after age 70. Talk to your health care provider about which screenings and vaccines you need and how often you need them. This information is not intended to replace advice given to you by your health care provider. Make sure you discuss any questions you have with your health care provider. Document Released: 04/13/2015 Document Revised: 12/05/2015 Document Reviewed: 01/16/2015 Elsevier Interactive Patient Education  2017 [place] Prevention in the Home Falls can cause injuries. They can happen to people of all ages. There are many things you can do to make your home safe and to help prevent falls. What can I do on the outside of my home? Regularly fix the edges of walkways and driveways and fix any cracks. Remove anything that might make you trip as you walk through a door, such as a raised step or threshold. Trim any bushes or trees on the path to your home. Use bright outdoor lighting. Clear any walking paths of anything that might make someone trip, such as rocks or tools. Regularly check to see if handrails are loose or broken. Make sure that both sides of any steps have handrails. Any raised decks and porches should have guardrails on the edges. Have any leaves, snow, or ice cleared regularly. Use sand or salt on walking paths during winter. Clean up any spills in your garage right away. This includes oil or grease spills. What can I do in the bathroom? Use night lights. Install grab bars by the toilet and in the tub and shower. Do not use towel bars as grab bars. Use non-skid mats or decals in the tub or shower. If you need to sit down in the shower, use a plastic, non-slip stool. Keep the floor dry. Clean up any water that spills on the floor as soon  as it happens. Remove soap buildup in the tub or shower regularly. Attach bath mats securely with double-sided non-slip rug tape. Do not have throw rugs and other things on the floor that can make you trip. What can I do in the bedroom? Use night lights. Make sure that you have a light by your bed that is easy to reach. Do not use any sheets or blankets that are too big for your bed. They should not hang down onto the floor. Have a firm chair that has side arms. You can use this for support while you get dressed. Do not have throw rugs and other things on the floor that can make you trip. What can I do in the kitchen? Clean up any spills right away. Avoid walking on wet floors. Keep items that you use a lot in easy-to-reach places. If you need to reach something above you, use a strong step stool that has a grab bar. Keep electrical cords out of the way. Do not use floor polish or wax that makes floors slippery. If you must use wax, use non-skid floor wax. Do not have throw rugs and other things on the floor that can make you trip. What can I do with my stairs? Do not leave any items on the stairs. Make sure that there are handrails on both sides of the stairs and use them. Fix handrails that are  broken or loose. Make sure that handrails are as long as the stairways. Check any carpeting to make sure that it is firmly attached to the stairs. Fix any carpet that is loose or worn. Avoid having throw rugs at the top or bottom of the stairs. If you do have throw rugs, attach them to the floor with carpet tape. Make sure that you have a light switch at the top of the stairs and the bottom of the stairs. If you do not have them, ask someone to add them for you. What else can I do to help prevent falls? Wear shoes that: Do not have high heels. Have rubber bottoms. Are comfortable and fit you well. Are closed at the toe. Do not wear sandals. If you use a stepladder: Make sure that it is fully  opened. Do not climb a closed stepladder. Make sure that both sides of the stepladder are locked into place. Ask someone to hold it for you, if possible. Clearly mark and make sure that you can see: Any grab bars or handrails. First and last steps. Where the edge of each step is. Use tools that help you move around (mobility aids) if they are needed. These include: Canes. Walkers. Scooters. Crutches. Turn on the lights when you go into a dark area. Replace any light bulbs as soon as they burn out. Set up your furniture so you have a clear path. Avoid moving your furniture around. If any of your floors are uneven, fix them. If there are any pets around you, be aware of where they are. Review your medicines with your doctor. Some medicines can make you feel dizzy. This can increase your chance of falling. Ask your doctor what other things that you can do to help prevent falls. This information is not intended to replace advice given to you by your health care provider. Make sure you discuss any questions you have with your health care provider. Document Released: 01/11/2009 Document Revised: 08/23/2015 Document Reviewed: 04/21/2014 Elsevier Interactive Patient Education  2017 [employer].

## 2021-08-27 NOTE — Progress Notes (Addendum)
Virtual Visit via Telephone Note  I connected with  Franklin Rivera on 08/27/21 at  8:00 AM EDT by telephone and verified that I am speaking with the correct person using two identifiers.  Medicare Annual Wellness visit completed telephonically due to Covid-19 pandemic.   Persons participating in this call: This Health Coach and this patient.   Location: Patient: Home Provider: Office    I discussed the limitations, risks, security and privacy concerns of performing an evaluation and management service by telephone and the availability of in person appointments. The patient expressed understanding and agreed to proceed.  Unable to perform video visit due to video visit attempted and failed and/or patient does not have video capability.   Some vital signs may be absent or patient reported.   Willette Brace, LPN   Subjective:   Franklin Rivera is a 71 y.o. male who presents for Medicare Annual/Subsequent preventive examination.  Review of Systems     Cardiac Risk Factors include: advanced age (>45mn, >>87women);dyslipidemia;hypertension;male gender     Objective:    There were no vitals filed for this visit. There is no height or weight on file to calculate BMI.     08/27/2021    8:02 AM 03/27/2021    7:26 AM 01/11/2021    9:15 PM 08/20/2020    8:21 AM 07/11/2019    1:54 PM  Advanced Directives  Does Patient Have a Medical Advance Directive? No No No No No  Would patient like information on creating a medical advance directive? No - Patient declined No - Patient declined  No - Patient declined Yes (MAU/Ambulatory/Procedural Areas - Information given)    Current Medications (verified) Outpatient Encounter Medications as of 08/27/2021  Medication Sig   cetirizine (ZYRTEC) 10 MG tablet Take 1 tablet (10 mg total) by mouth daily.   HYDROcodone-acetaminophen (NORCO) 10-325 MG tablet Take by mouth.   losartan (COZAAR) 50 MG tablet Take by mouth.   ondansetron (ZOFRAN) 8 MG  tablet Take 1 tablet (8 mg total) by mouth 2 (two) times daily as needed (Nausea or vomiting).   prochlorperazine (COMPAZINE) 10 MG tablet Take 1 tablet (10 mg total) by mouth every 6 (six) hours as needed (Nausea or vomiting).   amLODipine (NORVASC) 5 MG tablet Take by mouth.   [DISCONTINUED] HYDROcodone-acetaminophen (NORCO) 7.5-325 MG tablet Take 1 tablet by mouth as directed. 0.5-1 tablet(s) by mouth 1-3 times daily   [DISCONTINUED] losartan (COZAAR) 25 MG tablet TAKE 1 TABLET(25 MG) BY MOUTH DAILY   No facility-administered encounter medications on file as of 08/27/2021.    Allergies (verified) Patient has no known allergies.   History: Past Medical History:  Diagnosis Date   Allergy    Headache    Hyperlipidemia    Past Surgical History:  Procedure Laterality Date   BLADDER SURGERY     IR 3D INDEPENDENT WKST  03/27/2021   IR ANGIOGRAM SELECTIVE EACH ADDITIONAL VESSEL  03/27/2021   IR ANGIOGRAM SELECTIVE EACH ADDITIONAL VESSEL  03/27/2021   IR ANGIOGRAM SELECTIVE EACH ADDITIONAL VESSEL  03/27/2021   IR ANGIOGRAM SELECTIVE EACH ADDITIONAL VESSEL  03/27/2021   IR ANGIOGRAM SELECTIVE EACH ADDITIONAL VESSEL  03/27/2021   IR ANGIOGRAM SELECTIVE EACH ADDITIONAL VESSEL  03/27/2021   IR ANGIOGRAM VISCERAL SELECTIVE  03/27/2021   IR EMBO TUMOR ORGAN ISCHEMIA INFARCT INC GUIDE ROADMAPPING  03/27/2021   IR RADIOLOGIST EVAL & MGMT  03/07/2021   IR RADIOLOGIST EVAL & MGMT  04/16/2021   IR RADIOLOGIST EVAL &  MGMT  05/27/2021   IR US GUIDANCE  03/27/2021   NASAL FRACTURE SURGERY     Skull Surgery     Family History  Problem Relation Age of Onset   Cancer Neg Hx    Social History   Socioeconomic History   Marital status: Married    Spouse name: Not on file   Number of children: Not on file   Years of education: Not on file   Highest education level: Not on file  Occupational History   Not on file  Tobacco Use   Smoking status: Never   Smokeless tobacco: Never  Vaping Use    Vaping Use: Never used  Substance and Sexual Activity   Alcohol use: Never   Drug use: Not Currently   Sexual activity: Not on file  Other Topics Concern   Not on file  Social History Narrative   Not on file   Social Determinants of Health   Financial Resource Strain: Low Risk    Difficulty of Paying Living Expenses: Not hard at all  Food Insecurity: No Food Insecurity   Worried About Charity fundraiser in the Last Year: Never true   [place] in the Last Year: Never true  Transportation Needs: No Transportation Needs   Lack of Transportation (Medical): No   Lack of Transportation (Non-Medical): No  Physical Activity: Insufficiently Active   Days of Exercise per Week: 3 days   Minutes of Exercise per Session: 30 min  Stress: No Stress Concern Present   Feeling of Stress : Not at all  Social Connections: Moderately Isolated   Frequency of Communication with Friends and Family: More than three times a week   Frequency of Social Gatherings with Friends and Family: More than three times a week   Attends Religious Services: Never   Marine scientist or Organizations: No   Attends Music therapist: Never   Marital Status: Married    Tobacco Counseling Counseling given: Not Answered   Clinical Intake:  Pre-visit preparation completed: Yes  Pain : No/denies pain     BMI - recorded: 25.93 Nutritional Status: BMI 25 -29 Overweight Nutritional Risks: None Diabetes: No  How often do you need to have someone help you when you read instructions, pamphlets, or other written materials from your doctor or pharmacy?: 1 - Never  Diabetic?no  Interpreter Needed?: No  Information entered by :: Charlott Rakes, LPN   Activities of Daily Living    08/27/2021    8:03 AM 03/27/2021    5:00 PM  In your present state of health, do you have any difficulty performing the following activities:  Hearing? 0   Vision? 0   Difficulty concentrating or making  decisions? 0   Walking or climbing stairs? 0   Dressing or bathing? 0   Doing errands, shopping? 0 0  Preparing Food and eating ? N   Using the Toilet? N   In the past six months, have you accidently leaked urine? N   Do you have problems with loss of bowel control? N   Managing your Medications? N   Managing your Finances? N   Housekeeping or managing your Housekeeping? N     Patient Care Team: [name], MD as PCP - General (Family Medicine) Golden Circle, FNP as Nurse Practitioner (Family Medicine) [facility] as Consulting Physician Rozetta Nunnery, MD (Inactive) as Consulting Physician (Otolaryngology)  Indicate any recent Medical Services you [phone]  have received from other than [facility] providers in the past year (date may be approximate).     Assessment:   This is a routine wellness examination for Franklin Rivera.  Hearing/Vision screen Hearing Screening - Comments:: Pt denies any hearing aids  Vision Screening - Comments:: Pt follows up with Dr Katy Fitch for annual eye exams  Dietary issues and exercise activities discussed: Current Exercise Habits: Home exercise routine, Type of exercise: walking, Time (Minutes): 35, Frequency (Times/Week): 3, Weekly Exercise (Minutes/Week): 105   Goals Addressed             This Visit's Progress    Patient Stated       None at this time       Depression Screen    08/27/2021    8:01 AM 02/12/2021    2:21 PM 01/08/2021    1:24 PM 08/20/2020    8:20 AM 07/11/2019    1:55 PM 06/24/2019   11:01 AM 05/23/2019    8:33 AM  PHQ 2/9 Scores  PHQ - 2 Score 0 0 0 0 0 0 0    Fall Risk    08/27/2021    8:03 AM 01/08/2021    1:24 PM 08/20/2020    8:22 AM 07/11/2019    1:55 PM 06/24/2019   11:01 AM  [place] in the past year? 0 0 0 0 0  Number falls in past yr: 0 0 0 0   Injury with Fall? 0 0 0 0   Risk for fall due to :  No Fall Risks     Follow up Falls prevention discussed  Falls prevention discussed  Falls evaluation completed;Education provided;Falls prevention discussed Falls evaluation completed    FALL RISK PREVENTION PERTAINING TO THE HOME:  Any stairs in or around the home? Yes  If so, are there any without handrails? No  Home free of loose throw rugs in walkways, pet beds, electrical cords, etc? Yes  Adequate lighting in your home to reduce risk of falls? Yes   ASSISTIVE DEVICES UTILIZED TO PREVENT FALLS:  Life alert? No  Use of a cane, walker or w/c? No  Grab bars in the bathroom? Yes  Shower chair or bench in shower? Yes  Elevated toilet seat or a handicapped toilet? No   TIMED UP AND GO:  Was the test performed? No .   Cognitive Function:        08/27/2021    8:04 AM 08/20/2020    8:24 AM 07/11/2019    1:54 PM  6CIT Screen  What Year? 0 points 0 points 0 points  What month? 0 points 0 points 0 points  What time? 0 points  0 points  Count back from 20 0 points 0 points 0 points  Months in reverse 0 points 0 points 0 points  Repeat phrase 2 points 2 points 0 points  Total Score 2 points  0 points    Immunizations  There is no immunization history on file for this patient.  TDAP status: Up to date  Flu Vaccine status: Declined, Education has been provided regarding the importance of this vaccine but patient still declined. Advised may receive this vaccine at local pharmacy or Health Dept. Aware to provide a copy of the vaccination record if obtained from local pharmacy or Health Dept. Verbalized acceptance and understanding.  Pneumococcal vaccine status: Declined,  Education has been provided regarding the importance of this vaccine but patient still declined. Advised may receive this vaccine at local  pharmacy or Health Dept. Aware to provide a copy of the vaccination record if obtained from local pharmacy or Health Dept. Verbalized acceptance and understanding.   Covid-19 vaccine status: Declined, Education has been provided regarding the importance of this  vaccine but patient still declined. Advised may receive this vaccine at local pharmacy or Health Dept.or vaccine clinic. Aware to provide a copy of the vaccination record if obtained from local pharmacy or Health Dept. Verbalized acceptance and understanding.  Qualifies for Shingles Vaccine? Yes   Zostavax completed No   Shingrix Completed?: No.    Education has been provided regarding the importance of this vaccine. Patient has been advised to call insurance company to determine out of pocket expense if they have not yet received this vaccine. Advised may also receive vaccine at local pharmacy or Health Dept. Verbalized acceptance and understanding.  Screening Tests Health Maintenance  Topic Date Due   COVID-19 Vaccine (1) 09/12/2021 (Originally 12/25/1950)   Zoster Vaccines- Shingrix (1 of 2) 11/27/2021 (Originally 06/24/1969)   Pneumonia Vaccine 67+ Years old (1 - PCV) 02/12/2022 (Originally 06/23/2016)   INFLUENZA VACCINE  10/29/2021   Fecal DNA (Cologuard)  06/06/2022   TETANUS/TDAP  12/19/2024   Hepatitis C Screening  Completed   HPV VACCINES  Aged Out    Health Maintenance  There are no preventive care reminders to display for this patient.    Colorectal cancer screening: Type of screening: Cologuard. Completed 06/06/19. Repeat every 3 years   Additional Screening:  Hepatitis C Screening:  Completed 10/17/19  Vision Screening: Recommended annual ophthalmology exams for early detection of glaucoma and other disorders of the eye. Is the patient up to date with their annual eye exam?  Yes  Who is the provider or what is the name of the office in which the patient attends annual eye exams? Dr Katy Fitch  If pt is not established with a provider, would they like to be referred to a provider to establish care? No .   Dental Screening: Recommended annual dental exams for proper oral hygiene  Community Resource Referral / Chronic Care Management: CRR required this visit?  No   CCM  required this visit?  No      Plan:     I have personally reviewed and noted the following in the patient's chart:   Medical and social history Use of alcohol, tobacco or illicit drugs  Current medications and supplements including opioid prescriptions. Patient is currently taking opioid prescriptions. Information provided to patient regarding non-opioid alternatives. Patient advised to discuss non-opioid treatment plan with their provider. Functional ability and status Nutritional status Physical activity Advanced directives List of other physicians Hospitalizations, surgeries, and ER visits in previous 12 months Vitals Screenings to include cognitive, depression, and falls Referrals and appointments  In addition, I have reviewed and discussed with patient certain preventive protocols, quality metrics, and best practice recommendations. A written personalized care plan for preventive services as well as general preventive health recommendations were provided to patient.     Willette Brace, LPN   [id number]/54/[id number]   Nurse Notes: None

## 2021-08-29 ENCOUNTER — Encounter: Payer: Self-pay | Admitting: Hematology

## 2021-08-29 NOTE — Progress Notes (Signed)
HEMATOLOGY/ONCOLOGY CLINIC VISIT NOTE  Date of Service: .08/22/2021   Patient Care Team: [name], MD as PCP - General (Family Medicine) Golden Circle, FNP as Nurse Practitioner (Family Medicine) [facility] as Consulting Physician Rozetta Nunnery, MD (Inactive) as Consulting Physician (Otolaryngology)  CHIEF COMPLAINTS/PURPOSE OF CONSULTATION:  Follow-up for continued evaluation and management of hepatocellular carcinoma and follow-up prior to fourth cycle of Avastin atezolizumab.  And review of MRI  HISTORY OF PRESENTING ILLNESS:  Please see previous note for details on initial presentation  INTERVAL HISTORY  Patient is here for continued evaluation and management of his hepatocellular carcinoma and prior to his fourth cycle of Avastin plus atezolizumab treatment . He notes some mild intermittent right lower quadrant abdominal discomfort. Notes no nausea or vomiting no jaundice. No significant toxicities from his previous treatment. MRI of the abdomen is concerning for disease progression.  We discussed that there is a small concern of pseudoprogression with immunotherapy and therefore we sent her tumor markers as well. Unfortunately his AFP tumor marker continues to rapidly increase and is up to 181 from a previous level of 72 suggesting true disease progression. We also sent out a Guardant360 on his blood to evaluate for other targeted therapies. We discussed that we will have to start him on a different treatment likely with lenvatinib unless he has any other targetable mutations. His wife was with him during this discussion. No change in bowel habits.  No new urinary symptoms. Had extensive goals of care discussion with the patient.  He is keen to pursue palliative treatment options.  MEDICAL HISTORY:  Past Medical History:  Diagnosis Date   Allergy    Headache    Hyperlipidemia     SURGICAL HISTORY: Past Surgical History:   Procedure Laterality Date   BLADDER SURGERY     IR 3D INDEPENDENT WKST  03/27/2021   IR ANGIOGRAM SELECTIVE EACH ADDITIONAL VESSEL  03/27/2021   IR ANGIOGRAM SELECTIVE EACH ADDITIONAL VESSEL  03/27/2021   IR ANGIOGRAM SELECTIVE EACH ADDITIONAL VESSEL  03/27/2021   IR ANGIOGRAM SELECTIVE EACH ADDITIONAL VESSEL  03/27/2021   IR ANGIOGRAM SELECTIVE EACH ADDITIONAL VESSEL  03/27/2021   IR ANGIOGRAM SELECTIVE EACH ADDITIONAL VESSEL  03/27/2021   IR ANGIOGRAM VISCERAL SELECTIVE  03/27/2021   IR EMBO TUMOR ORGAN ISCHEMIA INFARCT INC GUIDE ROADMAPPING  03/27/2021   IR RADIOLOGIST EVAL & MGMT  03/07/2021   IR RADIOLOGIST EVAL & MGMT  04/16/2021   IR RADIOLOGIST EVAL & MGMT  05/27/2021   IR US GUIDANCE  03/27/2021   NASAL FRACTURE SURGERY     Skull Surgery      SOCIAL HISTORY: Social History   Socioeconomic History   Marital status: Married    Spouse name: Not on file   Number of children: Not on file   Years of education: Not on file   Highest education level: Not on file  Occupational History   Not on file  Tobacco Use   Smoking status: Never   Smokeless tobacco: Never  Vaping Use   Vaping Use: Never used  Substance and Sexual Activity   Alcohol use: Never   Drug use: Not Currently   Sexual activity: Not on file  Other Topics Concern   Not on file  Social History Narrative   Not on file   Social Determinants of Health   Financial Resource Strain: Low Risk    Difficulty of Paying Living Expenses: Not hard at all  Food Insecurity:  No Food Insecurity   Worried About Charity fundraiser in the Last Year: Never true   Ran Out of Food in the Last Year: Never true  Transportation Needs: No Transportation Needs   Lack of Transportation (Medical): No   Lack of Transportation (Non-Medical): No  Physical Activity: Insufficiently Active   Days of Exercise per Week: 3 days   Minutes of Exercise per Session: 30 min  Stress: No Stress Concern Present   Feeling of Stress : Not at all   Social Connections: Moderately Isolated   Frequency of Communication with Friends and Family: More than three times a week   Frequency of Social Gatherings with Friends and Family: More than three times a week   Attends Religious Services: Never   Marine scientist or Organizations: No   Attends Music therapist: Never   Marital Status: Married  Human resources officer Violence: Not At Risk   Fear of Current or Ex-Partner: No   Emotionally Abused: No   Physically Abused: No   Sexually Abused: No    FAMILY HISTORY: Family History  Problem Relation Age of Onset   Cancer Neg Hx     ALLERGIES:  has No Known Allergies.  MEDICATIONS:  Current Outpatient Medications  Medication Sig Dispense Refill   amLODipine (NORVASC) 5 MG tablet Take by mouth.     cetirizine (ZYRTEC) 10 MG tablet Take 1 tablet (10 mg total) by mouth daily. 90 tablet 0   HYDROcodone-acetaminophen (NORCO) 10-325 MG tablet Take by mouth.     losartan (COZAAR) 50 MG tablet Take by mouth.     ondansetron (ZOFRAN) 8 MG tablet Take 1 tablet (8 mg total) by mouth 2 (two) times daily as needed (Nausea or vomiting). 30 tablet 1   prochlorperazine (COMPAZINE) 10 MG tablet Take 1 tablet (10 mg total) by mouth every 6 (six) hours as needed (Nausea or vomiting). 30 tablet 1   No current facility-administered medications for this visit.    REVIEW OF SYSTEMS:   10 Point review of Systems was done is negative except as noted above.  PHYSICAL EXAMINATION:  Vital signs reviewed NAD GENERAL:alert, in no acute distress and comfortable SKIN: no acute rashes, no significant lesions EYES: conjunctiva are pink and non-injected, sclera anicteric OROPHARYNX: MMM, no exudates, no oropharyngeal erythema or ulceration NECK: supple, no JVD LYMPH:  no palpable lymphadenopathy in the cervical, axillary or inguinal regions LUNGS: clear to auscultation b/l with normal respiratory effort HEART: regular rate & rhythm ABDOMEN:   normoactive bowel sounds , non tender, not distended. Extremity: no pedal edema PSYCH: alert & oriented x 3 with fluent speech NEURO: no focal motor/sensory deficits    LABORATORY DATA:  I have reviewed the data as listed  .    Latest Ref Rng & Units 08/22/2021    1:16 PM 08/01/2021   12:41 PM 07/11/2021   11:33 AM  CBC  WBC 4.0 - 10.5 K/uL 4.9   4.0   4.2    Hemoglobin 13.0 - 17.0 g/dL 13.3   13.3   13.1    Hematocrit 39.0 - 52.0 % 40.4   40.3   40.2    Platelets 150 - 400 K/uL 214   192   199      .    Latest Ref Rng & Units 08/22/2021    1:16 PM 08/01/2021   12:41 PM 07/11/2021   11:33 AM  CMP  Glucose 70 - 99 mg/dL 120   106  104    BUN 8 - 23 mg/dL '21   18   18    '$ Creatinine 0.61 - 1.24 mg/dL 0.78   0.75   0.81    Sodium 135 - 145 mmol/L 137   136   138    Potassium 3.5 - 5.1 mmol/L 4.1   4.1   4.1    Chloride 98 - 111 mmol/L 104   103   102    CO2 22 - 32 mmol/L '25   27   29    '$ Calcium 8.9 - 10.3 mg/dL 9.1   9.2   9.6    Total Protein 6.5 - 8.1 g/dL 8.0   7.8   8.6    Total Bilirubin 0.3 - 1.2 mg/dL 0.4   0.5   0.6    Alkaline Phos 38 - 126 U/L 205   165   158    AST 15 - 41 U/L 67   56   38    ALT 0 - 44 U/L 94   72   50     HCV FibroSURE Order: [id number] Status: Final result   Visible to patient: Yes (not seen)   Next appt: 02/05/2021 at 08:30 AM in Radiology ([facility])   Dx: Liver mass   0 Result Notes Component Ref Range & Units 10 d ago 1 yr ago  Fibrosis Score 0.00 - 0.21 0.80 High   0.72 R   Fibrosis Stage   Cirrhosis  F3        RADIOGRAPHIC STUDIES: I have personally reviewed the radiological images as listed and agreed with the findings in the report. MR LIVER W WO CONTRAST  Result Date: 08/16/2021 CLINICAL DATA:  Status post Y 90 treatment and immunotherapy for hepatocellular carcinoma. EXAM: MRI ABDOMEN WITHOUT AND WITH CONTRAST TECHNIQUE: Multiplanar multisequence MR imaging of the abdomen was performed both before and after the administration  of intravenous contrast. CONTRAST:  42m GADAVIST GADOBUTROL 1 MMOL/ML IV SOLN COMPARISON:  05/27/2021 CT. FINDINGS: Lower chest: Mild cardiomegaly, without pericardial or pleural effusion. Tiny hiatal hernia. Borderline right cardiophrenic angle adenopathy at 8 mm is similar. Hepatobiliary: Direct comparison of the extent of liver disease is made on series 24 to the prior portal venous phase CT images. The dominant right hepatic lobe mass measures 14.5 x 9.7 cm on 40/24 versus 13.0 x 7.9 cm when measured at the same level on the prior. Persistent extension into the perihepatic space posteriorly and laterally including on 49/24. The right hepatic dome mass has significantly enlarged. 5.3 x 6.5 cm on 17/24 today versus 3.1 x 3.0 cm on the prior exam (when remeasured). Multiple left-sided masses, with an index segment 4 B lesion measuring 3.6 x 3.4 cm on 45/24 versus 1.9 x 1.8 cm on the prior exam (when remeasured). Normal gallbladder, without biliary ductal dilatation. Pancreas:  Normal, without mass or ductal dilatation. Spleen:  Normal in size, without focal abnormality. Adrenals/Urinary Tract: Normal adrenal glands. Interpolar left renal cyst. Normal right kidney. No hydronephrosis. Stomach/Bowel: Normal remainder of the stomach. Colonic stool burden suggests constipation. Normal small bowel. Vascular/Lymphatic: Aortic atherosclerosis. Main portal and splenic veins are patent. No abdominal adenopathy. Other:  Trace perihepatic ascites. Musculoskeletal: No acute osseous abnormality. IMPRESSION: 1. Marked progression of tumor burden throughout both liver lobes as detailed above. 2. No evidence of extrahepatic metastatic disease. 3.  Tiny hiatal hernia. 4.  Aortic Atherosclerosis (ICD10-I70.0). 5. Trace perihepatic ascites. Electronically Signed   By: KAdria DevonD.  On: 08/16/2021 16:07    ASSESSMENT & PLAN:   71 year old male with history of hepatitis C with recently diagnosed hepatocellular carcinoma.  1)  New right hepatic lobe 7.9 cm mass concerning for Hepatocellular carcinoma. Told he does not have overt cirrhotic morphology on MRI he previously has had a fibrosis score of F3. MRI is LIRADS 5 concern for hepatocellular carcinoma.  Patient has been seen by Dr. Kathlene Cote and is status post Y90 treatment CT abdomen on 05/20/2021 showed interval progression of dominant mass in the right hepatic lobe compared to CT scan and November 2022.  New malignant lesion in the dome of the liver. 2) liver cirrhosis likely related to hepatitis C -HCV FibroSure testing was done and shows F4 status consistent with liver cirrhosis Has been treated with Epclusa for 12 weeks starting 07/12/2020 with Terri Piedra FNP. PLAN -Labs done today were discussed in detail with the patient CBC and CMP stable AFP tumor marker shows significant increase suggesting overt disease progression. MRI of the abdomen shows marked progression of tumor burden within both lobes of the liver.  No overt extrahepatic disease noted. -Patient completed 4 cycles of Avastin and atezolizumab with no significant notable toxicities.  We will have to hold this treatment given his overt disease progression. -We will reach out to Dr. Kathlene Cote to see if any other localized treatments are a possibility. -We will switch the patient to lenvatinib -Guardant360 sent out we will follow-up to evaluate for mutation profile to see if he is a candidate for any other targeted therapies.   Follow-up  The total time spent in the appointment was 30 minutes*.  All of the patient's questions were answered with apparent satisfaction. The patient knows to call the clinic with any problems, questions or concerns.   Sullivan Lone MD MS AAHIVMS [facility] Hematology/Oncology Physician [facility]  .*Total Encounter Time as defined by the Centers for Medicare and Medicaid Services includes, in addition to the face-to-face time of a patient visit (documented  in the note above) non-face-to-face time: obtaining and reviewing outside history, ordering and reviewing medications, tests or procedures, care coordination (communications with other health care professionals or caregivers) and documentation in the medical record.

## 2021-09-01 DIAGNOSIS — C220 Liver cell carcinoma: Secondary | ICD-10-CM | POA: Diagnosis not present

## 2021-09-03 LAB — GUARDANT 360

## 2021-09-06 DIAGNOSIS — M79671 Pain in right foot: Secondary | ICD-10-CM | POA: Diagnosis not present

## 2021-09-06 DIAGNOSIS — I10 Essential (primary) hypertension: Secondary | ICD-10-CM | POA: Diagnosis not present

## 2021-09-06 DIAGNOSIS — Z79899 Other long term (current) drug therapy: Secondary | ICD-10-CM | POA: Diagnosis not present

## 2021-09-06 DIAGNOSIS — R030 Elevated blood-pressure reading, without diagnosis of hypertension: Secondary | ICD-10-CM | POA: Diagnosis not present

## 2021-09-06 DIAGNOSIS — C220 Liver cell carcinoma: Secondary | ICD-10-CM | POA: Diagnosis not present

## 2021-09-10 ENCOUNTER — Telehealth: Payer: Self-pay | Admitting: Hematology

## 2021-09-10 NOTE — Telephone Encounter (Signed)
.  Called patient to schedule appointment per 6/13 inbasket, patient is aware of date and time.   

## 2021-09-13 ENCOUNTER — Encounter: Payer: Self-pay | Admitting: Family Medicine

## 2021-09-13 ENCOUNTER — Ambulatory Visit (INDEPENDENT_AMBULATORY_CARE_PROVIDER_SITE_OTHER): Payer: Medicare HMO | Admitting: Family Medicine

## 2021-09-13 VITALS — BP 122/84 | HR 95 | Temp 98.0°F | Ht 73.0 in | Wt 185.0 lb

## 2021-09-13 DIAGNOSIS — M546 Pain in thoracic spine: Secondary | ICD-10-CM

## 2021-09-13 DIAGNOSIS — R220 Localized swelling, mass and lump, head: Secondary | ICD-10-CM

## 2021-09-13 DIAGNOSIS — K648 Other hemorrhoids: Secondary | ICD-10-CM

## 2021-09-13 MED ORDER — HYDROCORTISONE (PERIANAL) 2.5 % EX CREA: 1.0000 | TOPICAL_CREAM | Freq: Two times a day (BID) | CUTANEOUS | 0 refills | Status: AC

## 2021-09-13 NOTE — Patient Instructions (Signed)
It was very nice to see you today!  Hemorrhoid-use cream twice daily 1-2 weeks  Referring to surgeon for scalp  Go to [facility] for x-ray spine---M-F walk in 830-5 but closed lunch 1230-1   PLEASE NOTE:  If you had any lab tests please let us know if you have not heard back within a few days. You may see your results on MyChart before we have a chance to review them but we will give you a call once they are reviewed by [place]. If we ordered any referrals today, please let us know if you have not heard from their office within the next week.   Please try these tips to maintain a healthy lifestyle:  Eat most of your calories during the day when you are active. Eliminate processed foods including packaged sweets (pies, cakes, cookies), reduce intake of potatoes, white bread, white pasta, and white rice. Look for whole grain options, oat flour or almond flour.  Each meal should contain half fruits/vegetables, one quarter protein, and one quarter carbs (no bigger than a computer mouse).  Cut down on sweet beverages. This includes juice, soda, and sweet tea. Also watch fruit intake, though this is a healthier sweet option, it still contains natural sugar! Limit to 3 servings daily.  Drink at least 1 glass of water with each meal and aim for at least 8 glasses per day  Exercise at least 150 minutes every week.

## 2021-09-13 NOTE — Progress Notes (Signed)
Subjective:     Patient ID: Franklin Rivera, male    DOB: 28-Sep-1950, 71 y.o.   MRN: [id number]  Chief Complaint  Patient presents with   Lump on back of head    Noticed lump on back/top of head 2 months, also has one on his rectum, denies any pain    HPI Lump back of head for 2 mo on R  No pain.  One on rectum-for 3 mo. No itching.  No pain.wipes and feels it.   Spot in center of back-pain when coughs.  Liver cancer dx 02/2021. Embolization of tumor.  On chemo then immunotherapy-[id number].   Per pt, last MRI-tx not working.   There are no preventive care reminders to display for this patient.  Past Medical History:  Diagnosis Date   Allergy    Cancer ([place]) 02/2021   liver   Headache    Hyperlipidemia     Past Surgical History:  Procedure Laterality Date   BLADDER SURGERY     IR 3D INDEPENDENT WKST  03/27/2021   IR ANGIOGRAM SELECTIVE EACH ADDITIONAL VESSEL  03/27/2021   IR ANGIOGRAM SELECTIVE EACH ADDITIONAL VESSEL  03/27/2021   IR ANGIOGRAM SELECTIVE EACH ADDITIONAL VESSEL  03/27/2021   IR ANGIOGRAM SELECTIVE EACH ADDITIONAL VESSEL  03/27/2021   IR ANGIOGRAM SELECTIVE EACH ADDITIONAL VESSEL  03/27/2021   IR ANGIOGRAM SELECTIVE EACH ADDITIONAL VESSEL  03/27/2021   IR ANGIOGRAM VISCERAL SELECTIVE  03/27/2021   IR EMBO TUMOR ORGAN ISCHEMIA INFARCT INC GUIDE ROADMAPPING  03/27/2021   IR RADIOLOGIST EVAL & MGMT  03/07/2021   IR RADIOLOGIST EVAL & MGMT  04/16/2021   IR RADIOLOGIST EVAL & MGMT  05/27/2021   IR US GUIDANCE  03/27/2021   NASAL FRACTURE SURGERY     Skull Surgery     fx forehead and graft from L occiput    Outpatient Medications Prior to Visit  Medication Sig Dispense Refill   amLODipine (NORVASC) 10 MG tablet Take 10 mg by mouth daily.     cetirizine (ZYRTEC) 10 MG tablet Take 1 tablet (10 mg total) by mouth daily. 90 tablet 0   HYDROcodone-acetaminophen (NORCO) 10-325 MG tablet Take by mouth.     losartan (COZAAR) 100 MG tablet Take 100 mg by mouth  daily.     ondansetron (ZOFRAN) 8 MG tablet Take 1 tablet (8 mg total) by mouth 2 (two) times daily as needed (Nausea or vomiting). 30 tablet 1   prochlorperazine (COMPAZINE) 10 MG tablet Take 1 tablet (10 mg total) by mouth every 6 (six) hours as needed (Nausea or vomiting). 30 tablet 1   amLODipine (NORVASC) 5 MG tablet Take by mouth.     losartan (COZAAR) 50 MG tablet Take by mouth.     No facility-administered medications prior to visit.    No Known Allergies ROS neg/noncontributory except as noted HPI/below Urinary freq and gotta go since started on tx.   Abd pain-mild, "annoying".  Mid abd     Objective:     BP 122/84   Pulse 95   Temp 98 F (36.7 C) (Temporal)   Ht '6\' 1"'$  (1.854 m)   Wt 185 lb (83.9 kg)   SpO2 98%   BMI 24.41 kg/m  Wt Readings from Last 3 Encounters:  09/13/21 185 lb (83.9 kg)  08/01/21 191 lb 3.2 oz (86.7 kg)  07/11/21 190 lb (86.2 kg)    Physical Exam   Gen: WDWN NAD HEENT: NCAT, conjunctiva not injected, sclera nonicteric NECK:  supple, no thyromegaly, no nodes, no carotid bruits CARDIAC: RRR, S1S2+, no murmur.  LUNGS: CTAB. No wheezes ABDOMEN:  BS+, soft, NTND,   EXT:  no edema MSK: thoracic spine between upper shoulder blades-approx 2-3cm softness area and tenderness to palp spine there. Chaperone QJ present.  Rectum-on R-tiny hemorroid. Scalp-approx 1.5 cm nt,not red,not fluctuant nodule. NEURO: A&O x3.  CN II-XII intact.  PSYCH: normal mood. Good eye contact     Assessment & Plan:   Problem List Items Addressed This Visit   None Visit Diagnoses     Mass of scalp    -  Primary   Relevant Orders   Ambulatory referral to General Surgery   Other hemorrhoids       Relevant Medications   losartan (COZAAR) 100 MG tablet   amLODipine (NORVASC) 10 MG tablet   Thoracic spine pain       Relevant Orders   DG Thoracic Spine W/Swimmers      Mass scalp-?cyst,?lipoma,?other.  40modx HKensettso concerns as there for 2 mo.  Refer  surgeon Hemorrhoid-no pain.  Anusol hc cream bid x7-14days.   Thoracic spine pain and ?lipoma there?   Check x-ray.  [facility]  Medications   hydrocortisone (ANUSOL-HC) 2.5 % rectal cream    Sig: Place 1 Application rectally 2 (two) times daily.    Dispense:  30 g    Refill:  0    AWellington Hampshire MD

## 2021-09-17 ENCOUNTER — Inpatient Hospital Stay: Payer: Medicare HMO | Attending: Hematology | Admitting: Hematology

## 2021-09-17 ENCOUNTER — Ambulatory Visit (INDEPENDENT_AMBULATORY_CARE_PROVIDER_SITE_OTHER)
Admission: RE | Admit: 2021-09-17 | Discharge: 2021-09-17 | Disposition: A | Discharge status: Home / Self Care | Payer: Medicare HMO | Source: Ambulatory Visit | Attending: Family Medicine | Admitting: Family Medicine

## 2021-09-17 DIAGNOSIS — M7989 Other specified soft tissue disorders: Secondary | ICD-10-CM | POA: Diagnosis not present

## 2021-09-17 DIAGNOSIS — N2889 Other specified disorders of kidney and ureter: Secondary | ICD-10-CM | POA: Diagnosis not present

## 2021-09-17 DIAGNOSIS — M546 Pain in thoracic spine: Secondary | ICD-10-CM

## 2021-09-17 DIAGNOSIS — I700 Atherosclerosis of aorta: Secondary | ICD-10-CM | POA: Diagnosis not present

## 2021-09-17 DIAGNOSIS — Z79899 Other long term (current) drug therapy: Secondary | ICD-10-CM | POA: Diagnosis not present

## 2021-09-17 DIAGNOSIS — Z8619 Personal history of other infectious and parasitic diseases: Secondary | ICD-10-CM | POA: Insufficient documentation

## 2021-09-17 DIAGNOSIS — C220 Liver cell carcinoma: Secondary | ICD-10-CM | POA: Diagnosis not present

## 2021-09-17 MED ORDER — LENVIMA (12 MG DAILY DOSE) 3 X 4 MG PO CPPK
12.0000 mg | ORAL_CAPSULE | Freq: Every day | ORAL | 1 refills | Status: DC
  Filled 2021-09-17: qty 90, 90d supply, fill #0
  Filled 2021-09-18: qty 90, 30d supply, fill #0
  Filled 2021-10-09: qty 90, 30d supply, fill #1

## 2021-09-18 ENCOUNTER — Other Ambulatory Visit (HOSPITAL_COMMUNITY): Payer: Self-pay

## 2021-09-18 ENCOUNTER — Encounter: Payer: Self-pay | Admitting: Hematology

## 2021-09-18 ENCOUNTER — Telehealth: Payer: Self-pay | Admitting: Pharmacy Technician

## 2021-09-18 ENCOUNTER — Telehealth: Payer: Self-pay | Admitting: Pharmacist

## 2021-09-18 NOTE — Telephone Encounter (Signed)
Oral Oncology Pharmacist Encounter  Received new prescription for Lenvima (lenvatinib) for the treatment of metastatic hepatocellular carcinoma, planned duration until disease progression or unacceptable drug toxicity.  CBC w/ Diff and CMP from 08/22/21 assessed, pt T. Bili WNL, AST 67 U/L and ALT 94 U/L. No baseline dose adjustments required at this time. Prescription dose and frequency assessed for appropriateness.    Current medication list in Epic reviewed, DDIs with Lenvima identified: Concurrent use of Lenvima with ondansetron as well as prochlorperazine may increase risk of Qtc prolongation. Noted agents are only as PRN therapy and risk of emesis at current Lenvima dose is minimal to low per NCCN guidelines. If patient is nauseous with Lenvima, would recommend alternative agent to use PRN in that scenario. No change in therapy warranted at this time.   Evaluated chart and no patient barriers to medication adherence noted.   Prescription has been e-scribed to the [facility] for benefits analysis and approval.  Oral Oncology Clinic will continue to follow for insurance authorization, copayment issues, initial counseling and start date.  Leron Croak, PharmD, BCPS Hematology/Oncology Clinical Pharmacist [facility] [phone] 09/18/2021 8:31 AM

## 2021-09-18 NOTE — Telephone Encounter (Signed)
Oral Oncology Patient Advocate Encounter   Received notification that prior authorization for Lenvima is required.   PA submitted on 09/18/2021 Key [id number] Status is pending     [name], CPhT-Adv Pharmacy Patient Advocate Specialist [facility] Patient Advocate Team Direct Number: [phone]  Fax: [phone]

## 2021-09-18 NOTE — Telephone Encounter (Signed)
Oral Oncology Patient Advocate Encounter  Prior Authorization for Franklin Rivera has been approved.    PA# [id number] Effective dates: 09/18/2021 through 03/30/2022  Patients co-pay is $0.    [name], CPhT-Adv Pharmacy Patient [facility] Patient Advocate Team Direct Number: [phone]  Fax: [phone]

## 2021-09-18 NOTE — Telephone Encounter (Signed)
Oral Chemotherapy Pharmacist Encounter  I spoke with patient and patient's wife for overview of: Lenvima (lenvatinib) for the treatment of metastatic hepatocellular carcinoma, planned duration until disease progression or unacceptable drug toxicity.  Counseled patient on administration, dosing, side effects, monitoring, drug-food interactions, safe handling, storage, and disposal.  Patient will take Lenvima '4mg'$  capsules, 3 capsules ('12mg'$ ) by mouth once daily, with or without food, at approximately the same time each day.  Lenvima start date: 09/21/21  Adverse effects include but are not limited to: hypertension, hand-foot syndrome, diarrhea, joint pain, fatigue, headache, decreased calcium, proteinuria, increased risk of blood clots, and cardiac conduction issues.    Patient will obtain anti diarrheal and alert the office of 4 or more loose stools above baseline.  Patient instructed to notify office of any upcoming invasive procedures.  Michel Santee will be held for 6 days prior to scheduled surgery, restart based on healing and clinical judgement.   Reviewed with patient importance of keeping a medication schedule and plan for any missed doses. No barriers to medication adherence identified.  Medication reconciliation performed and medication/allergy list updated.  Insurance authorization for Michel Santee has been obtained. Test claim at the pharmacy revealed copayment $0 for 1st fill of Lenvima. This will ship from the [facility] on 09/19/21 to deliver to patient's home on 09/20/21.  Patient informed the pharmacy will reach out 5-7 days prior to needing next fill of Lenvima to coordinate continued medication acquisition to prevent break in therapy.  All questions answered.  Mr. And Ms. Axel voiced understanding and appreciation.   Medication education handout placed in mail for patient. Patient knows to call the office with questions or concerns. Oral Chemotherapy Clinic  phone number provided to patient.   Leron Croak, PharmD, BCPS Hematology/Oncology Clinical Pharmacist [facility] [place] [phone] 09/18/2021 11:50 AM

## 2021-09-23 ENCOUNTER — Encounter: Payer: Self-pay | Admitting: Hematology

## 2021-09-23 NOTE — Progress Notes (Signed)
HEMATOLOGY/ONCOLOGY PHONE VISIT NOTE  Date of Service:09/17/2021    Patient Care Team: Ardith Dark, MD as PCP - General (Family Medicine) Veryl Speak, FNP as Nurse Practitioner (Family Medicine) [facility] as Consulting Physician Drema Halon, MD (Inactive) as Consulting Physician (Otolaryngology)  CHIEF COMPLAINTS/PURPOSE OF CONSULTATION:  Discussion of Guardant360 results and discussion of second line of systemic palliative treatment for progressive unresectable hepatocellular carcinoma.  HISTORY OF PRESENTING ILLNESS:  Please see previous note for details on initial presentation  INTERVAL HISTORY ..I connected with Franklin Rivera on 09/17/2021 at  8:40 AM EDT by telephone visit and verified that I am speaking with the correct person using two identifiers.   I discussed the limitations, risks, security and privacy concerns of performing an evaluation and management service by telemedicine and the availability of in-person appointments. I also discussed with the patient that there may be a patient responsible charge related to this service. The patient expressed understanding and agreed to proceed.   Other persons participating in the visit and their role in the encounter: Patients wife   Patient's location: Home  Provider's location: [facility]   Chief Complaint: f/u for discussion of second line systemic therapy for unresectable locally advanced hepatocellular carcinoma.  Patient was called to discuss his Guardant360 results and next treatment options.  His recent MRI of the liver showed broad progression of his hepatocellular carcinoma.  Guardant360 showed no other targetable mutations. AFP tumor markers uptrending despite first-line therapy with atezolizumab and Avastin. We discussed goals of care and consideration of transition to best supportive care versus consideration of further palliative treatments and patient chose the latter.  We  discussed and patient is agreeable to pursue Lenvima at 20 mg p.o. daily for second line treatment of his unresectable progressive hepatocellular carcinoma.  MEDICAL HISTORY:  Past Medical History:  Diagnosis Date   Allergy    Cancer (HCC) 02/2021   liver   Headache    Hyperlipidemia     SURGICAL HISTORY: Past Surgical History:  Procedure Laterality Date   BLADDER SURGERY     IR 3D INDEPENDENT WKST  03/27/2021   IR ANGIOGRAM SELECTIVE EACH ADDITIONAL VESSEL  03/27/2021   IR ANGIOGRAM SELECTIVE EACH ADDITIONAL VESSEL  03/27/2021   IR ANGIOGRAM SELECTIVE EACH ADDITIONAL VESSEL  03/27/2021   IR ANGIOGRAM SELECTIVE EACH ADDITIONAL VESSEL  03/27/2021   IR ANGIOGRAM SELECTIVE EACH ADDITIONAL VESSEL  03/27/2021   IR ANGIOGRAM SELECTIVE EACH ADDITIONAL VESSEL  03/27/2021   IR ANGIOGRAM VISCERAL SELECTIVE  03/27/2021   IR EMBO TUMOR ORGAN ISCHEMIA INFARCT INC GUIDE ROADMAPPING  03/27/2021   IR RADIOLOGIST EVAL & MGMT  03/07/2021   IR RADIOLOGIST EVAL & MGMT  04/16/2021   IR RADIOLOGIST EVAL & MGMT  05/27/2021   IR US GUIDANCE  03/27/2021   NASAL FRACTURE SURGERY     Skull Surgery     fx forehead and graft from L occiput    SOCIAL HISTORY: Social History   Socioeconomic History   Marital status: Married    Spouse name: Not on file   Number of children: Not on file   Years of education: Not on file   Highest education level: Not on file  Occupational History   Not on file  Tobacco Use   Smoking status: Never   Smokeless tobacco: Never  Vaping Use   Vaping Use: Never used  Substance and Sexual Activity   Alcohol use: Never   Drug use: Not Currently  Sexual activity: Not on file  Other Topics Concern   Not on file  Social History Narrative   Not on file   Social Determinants of Health   Financial Resource Strain: Low Risk  (08/27/2021)   Overall Financial Resource Strain (CARDIA)    Difficulty of Paying Living Expenses: Not hard at all  Food Insecurity: No Food  Insecurity (08/27/2021)   Hunger Vital Sign    Worried About Running Out of Food in the Last Year: Never true    Ran Out of Food in the Last Year: Never true  Transportation Needs: No Transportation Needs (08/27/2021)   PRAPARE - Administrator, Civil Service (Medical): No    Lack of Transportation (Non-Medical): No  Physical Activity: Insufficiently Active (08/27/2021)   Exercise Vital Sign    Days of Exercise per Week: 3 days    Minutes of Exercise per Session: 30 min  Stress: No Stress Concern Present (08/27/2021)   [employer] of Occupational Health - Occupational Stress Questionnaire    Feeling of Stress : Not at all  Social Connections: Moderately Isolated (08/27/2021)   Social Connection and Isolation Panel [NHANES]    Frequency of Communication with Friends and Family: More than three times a week    Frequency of Social Gatherings with Friends and Family: More than three times a week    Attends Religious Services: Never    Database administrator or Organizations: No    Attends Banker Meetings: Never    Marital Status: Married  Catering manager Violence: Not At Risk (08/27/2021)   Humiliation, Afraid, Rape, and Kick questionnaire    Fear of Current or Ex-Partner: No    Emotionally Abused: No    Physically Abused: No    Sexually Abused: No    FAMILY HISTORY: Family History  Problem Relation Age of Onset   Cancer Neg Hx     ALLERGIES:  has No Known Allergies.  MEDICATIONS:  Current Outpatient Medications  Medication Sig Dispense Refill   lenvatinib 12 mg daily dose (LENVIMA, 12 MG DAILY DOSE,) 3 x 4 MG capsule Take 12 mg (3 capsules) by mouth daily. 90 capsule 1   amLODipine (NORVASC) 10 MG tablet Take 10 mg by mouth daily.     cetirizine (ZYRTEC) 10 MG tablet Take 1 tablet (10 mg total) by mouth daily. 90 tablet 0   HYDROcodone-acetaminophen (NORCO) 10-325 MG tablet Take by mouth.     hydrocortisone (ANUSOL-HC) 2.5 % rectal cream Place 1  Application rectally 2 (two) times daily. 30 g 0   losartan (COZAAR) 100 MG tablet Take 100 mg by mouth daily.     ondansetron (ZOFRAN) 8 MG tablet Take 1 tablet (8 mg total) by mouth 2 (two) times daily as needed (Nausea or vomiting). 30 tablet 1   prochlorperazine (COMPAZINE) 10 MG tablet Take 1 tablet (10 mg total) by mouth every 6 (six) hours as needed (Nausea or vomiting). 30 tablet 1   No current facility-administered medications for this visit.    REVIEW OF SYSTEMS:   10 Point review of Systems was done is negative except as noted above.  PHYSICAL EXAMINATION:  Telemedicine visit   LABORATORY DATA:  I have reviewed the data as listed  .    Latest Ref Rng & Units 08/22/2021    1:16 PM 08/01/2021   12:41 PM 07/11/2021   11:33 AM  CBC  WBC 4.0 - 10.5 K/uL 4.9  4.0  4.2   Hemoglobin 13.0 -  17.0 g/dL 16.1  09.6  04.5   Hematocrit 39.0 - 52.0 % 40.4  40.3  40.2   Platelets 150 - 400 K/uL 214  192  199     .    Latest Ref Rng & Units 08/22/2021    1:16 PM 08/01/2021   12:41 PM 07/11/2021   11:33 AM  CMP  Glucose 70 - 99 mg/dL 409  811  914   BUN 8 - 23 mg/dL 21  18  18    Creatinine 0.61 - 1.24 mg/dL 7.82  9.56  2.13   Sodium 135 - 145 mmol/L 137  136  138   Potassium 3.5 - 5.1 mmol/L 4.1  4.1  4.1   Chloride 98 - 111 mmol/L 104  103  102   CO2 22 - 32 mmol/L 25  27  29    Calcium 8.9 - 10.3 mg/dL 9.1  9.2  9.6   Total Protein 6.5 - 8.1 g/dL 8.0  7.8  8.6   Total Bilirubin 0.3 - 1.2 mg/dL 0.4  0.5  0.6   Alkaline Phos 38 - 126 U/L 205  165  158   AST 15 - 41 U/L 67  56  38   ALT 0 - 44 U/L 94  72  50    HCV FibroSURE Order: [id number] Status: Final result   Visible to patient: Yes (not seen)   Next appt: 02/05/2021 at 08:30 AM in Radiology ([facility])   Dx: Liver mass   0 Result Notes Component Ref Range & Units 10 d ago 1 yr ago  Fibrosis Score 0.00 - 0.21 0.80 High   0.72 R   Fibrosis Stage   Cirrhosis  F3        RADIOGRAPHIC STUDIES: I have personally reviewed  the radiological images as listed and agreed with the findings in the report. DG Thoracic Spine W/Swimmers  Result Date: 09/17/2021 CLINICAL DATA:  pain thoracic spine and some swelling. please place marker EXAM: THORACIC SPINE - 3 VIEWS COMPARISON:  CT abdomen pelvis 05/24/2021 FINDINGS: Limited evaluation due to overlapping osseous structures and overlying soft tissues. There is no evidence of thoracic spine fracture. Alignment is normal. No other significant bone abnormalities are identified. Calcifications overlying the left kidney suggesting nephrolithiasis. Aortic calcification. IMPRESSION: 1. No acute displaced fracture or traumatic listhesis of the thoracic spine. 2.  Aortic Atherosclerosis (ICD10-I70.0). Electronically Signed   By: Tish Frederickson M.D.   On: 09/17/2021 15:40    ASSESSMENT & PLAN:   72 year old male with history of hepatitis C with recently diagnosed hepatocellular carcinoma.  1) New right hepatic lobe 7.9 cm mass concerning for Hepatocellular carcinoma. Told he does not have overt cirrhotic morphology on MRI he previously has had a fibrosis score of F3. MRI is LIRADS 5 concern for hepatocellular carcinoma. Patient has been seen by Dr. Fredia Sorrow and is status post Y90 treatment CT abdomen on 05/20/2021 showed interval progression of dominant mass in the right hepatic lobe compared to CT scan and November 2022.  New malignant lesion in the dome of the liver. 2) liver cirrhosis likely related to hepatitis C -HCV FibroSure testing was done and shows F4 status consistent with liver cirrhosis Has been treated with Epclusa for 12 weeks starting 07/12/2020 with Marcos Eke FNP. PLAN -Labs show progression of AFP tumor marker -Recent MRI of the liver showed broad disease progression on first-line atezolizumab Avastin treatment. -Guardant360 shows no overt targetable mutation and MSI high status negative. -We discussed second line treatment  options including Lenvima versus best  supportive cares. -Patient prefers to initiate Lenvima treatment and we shall start him on 12 mg p.o. daily.  Prescription has been sent.  Follow-up Return to clinic with Dr. Candise Che in 3 weeks after starting [employer] for toxicity check with labs   .The total time spent in the appointment was 20 minutes* .  All of the patient's questions were answered with apparent satisfaction. The patient knows to call the clinic with any problems, questions or concerns.   Wyvonnia Lora MD MS AAHIVMS [facility] Hematology/Oncology Physician [facility]  .*Total Encounter Time as defined by the Centers for Medicare and Medicaid Services includes, in addition to the face-to-face time of a patient visit (documented in the note above) non-face-to-face time: obtaining and reviewing outside history, ordering and reviewing medications, tests or procedures, care coordination (communications with other health care professionals or caregivers) and documentation in the medical record.

## 2021-09-24 ENCOUNTER — Telehealth: Payer: Self-pay | Admitting: Hematology

## 2021-10-09 ENCOUNTER — Other Ambulatory Visit (HOSPITAL_COMMUNITY): Payer: Self-pay

## 2021-10-10 ENCOUNTER — Other Ambulatory Visit: Payer: Self-pay

## 2021-10-10 ENCOUNTER — Other Ambulatory Visit (HOSPITAL_COMMUNITY): Payer: Self-pay

## 2021-10-10 DIAGNOSIS — C220 Liver cell carcinoma: Secondary | ICD-10-CM

## 2021-10-10 NOTE — Progress Notes (Signed)
CBC/CMP entered for left leg pain per verbal order Lisabeth Devoid., PA.

## 2021-10-11 ENCOUNTER — Inpatient Hospital Stay: Payer: Medicare HMO | Attending: Hematology

## 2021-10-11 ENCOUNTER — Inpatient Hospital Stay (HOSPITAL_BASED_OUTPATIENT_CLINIC_OR_DEPARTMENT_OTHER): Payer: Medicare HMO | Admitting: Physician Assistant

## 2021-10-11 ENCOUNTER — Other Ambulatory Visit: Payer: Self-pay

## 2021-10-11 ENCOUNTER — Other Ambulatory Visit: Payer: Self-pay | Admitting: Physician Assistant

## 2021-10-11 ENCOUNTER — Ambulatory Visit (HOSPITAL_BASED_OUTPATIENT_CLINIC_OR_DEPARTMENT_OTHER)
Admission: RE | Admit: 2021-10-11 | Discharge: 2021-10-11 | Disposition: A | Discharge status: Home / Self Care | Payer: Medicare HMO | Source: Ambulatory Visit | Attending: Physician Assistant | Admitting: Physician Assistant

## 2021-10-11 ENCOUNTER — Encounter (HOSPITAL_COMMUNITY): Payer: Self-pay | Admitting: Internal Medicine

## 2021-10-11 ENCOUNTER — Inpatient Hospital Stay: Payer: Medicare HMO

## 2021-10-11 ENCOUNTER — Inpatient Hospital Stay (HOSPITAL_COMMUNITY)
Admission: AD | Admit: 2021-10-11 | Discharge: 2021-10-15 | DRG: 300 | Disposition: A | Discharge status: Home / Self Care | Payer: Medicare HMO | Source: Ambulatory Visit | Attending: Student | Admitting: Student

## 2021-10-11 VITALS — BP 148/105 | HR 99 | Temp 97.5°F | Resp 16 | Wt 172.0 lb

## 2021-10-11 DIAGNOSIS — M79662 Pain in left lower leg: Secondary | ICD-10-CM | POA: Insufficient documentation

## 2021-10-11 DIAGNOSIS — D630 Anemia in neoplastic disease: Secondary | ICD-10-CM | POA: Diagnosis present

## 2021-10-11 DIAGNOSIS — M79605 Pain in left leg: Secondary | ICD-10-CM

## 2021-10-11 DIAGNOSIS — C220 Liver cell carcinoma: Secondary | ICD-10-CM

## 2021-10-11 DIAGNOSIS — I82413 Acute embolism and thrombosis of femoral vein, bilateral: Secondary | ICD-10-CM | POA: Diagnosis not present

## 2021-10-11 DIAGNOSIS — K5900 Constipation, unspecified: Secondary | ICD-10-CM | POA: Diagnosis present

## 2021-10-11 DIAGNOSIS — I82442 Acute embolism and thrombosis of left tibial vein: Secondary | ICD-10-CM | POA: Diagnosis present

## 2021-10-11 DIAGNOSIS — E440 Moderate protein-calorie malnutrition: Secondary | ICD-10-CM | POA: Insufficient documentation

## 2021-10-11 DIAGNOSIS — Z79899 Other long term (current) drug therapy: Secondary | ICD-10-CM | POA: Insufficient documentation

## 2021-10-11 DIAGNOSIS — E559 Vitamin D deficiency, unspecified: Secondary | ICD-10-CM | POA: Diagnosis present

## 2021-10-11 DIAGNOSIS — I82452 Acute embolism and thrombosis of left peroneal vein: Secondary | ICD-10-CM | POA: Diagnosis present

## 2021-10-11 DIAGNOSIS — Z6823 Body mass index (BMI) 23.0-23.9, adult: Secondary | ICD-10-CM

## 2021-10-11 DIAGNOSIS — I82411 Acute embolism and thrombosis of right femoral vein: Secondary | ICD-10-CM | POA: Diagnosis present

## 2021-10-11 DIAGNOSIS — R748 Abnormal levels of other serum enzymes: Secondary | ICD-10-CM | POA: Diagnosis present

## 2021-10-11 DIAGNOSIS — E785 Hyperlipidemia, unspecified: Secondary | ICD-10-CM | POA: Insufficient documentation

## 2021-10-11 DIAGNOSIS — I10 Essential (primary) hypertension: Secondary | ICD-10-CM

## 2021-10-11 DIAGNOSIS — R7989 Other specified abnormal findings of blood chemistry: Secondary | ICD-10-CM | POA: Diagnosis not present

## 2021-10-11 DIAGNOSIS — R52 Pain, unspecified: Secondary | ICD-10-CM | POA: Diagnosis not present

## 2021-10-11 DIAGNOSIS — I824Z3 Acute embolism and thrombosis of unspecified deep veins of distal lower extremity, bilateral: Secondary | ICD-10-CM | POA: Insufficient documentation

## 2021-10-11 DIAGNOSIS — I82403 Acute embolism and thrombosis of unspecified deep veins of lower extremity, bilateral: Secondary | ICD-10-CM | POA: Diagnosis not present

## 2021-10-11 DIAGNOSIS — R269 Unspecified abnormalities of gait and mobility: Secondary | ICD-10-CM | POA: Diagnosis present

## 2021-10-11 DIAGNOSIS — N179 Acute kidney failure, unspecified: Secondary | ICD-10-CM | POA: Diagnosis not present

## 2021-10-11 DIAGNOSIS — I82432 Acute embolism and thrombosis of left popliteal vein: Principal | ICD-10-CM | POA: Diagnosis present

## 2021-10-11 LAB — CMP (CANCER CENTER ONLY)
ALT: 265 U/L — ABNORMAL HIGH (ref 0–44)
AST: 143 U/L — ABNORMAL HIGH (ref 15–41)
Albumin: 3.7 g/dL (ref 3.5–5.0)
Alkaline Phosphatase: 160 U/L — ABNORMAL HIGH (ref 38–126)
Anion gap: 9 (ref 5–15)
BUN: 19 mg/dL (ref 8–23)
CO2: 29 mmol/L (ref 22–32)
Calcium: 9.8 mg/dL (ref 8.9–10.3)
Chloride: 97 mmol/L — ABNORMAL LOW (ref 98–111)
Creatinine: 0.89 mg/dL (ref 0.61–1.24)
GFR, Estimated: >60 mL/min (ref >60)
Glucose, Bld: 86 mg/dL (ref 70–99)
Potassium: 4.3 mmol/L (ref 3.5–5.1)
Sodium: 135 mmol/L (ref 135–145)
Total Bilirubin: 1.3 mg/dL — ABNORMAL HIGH (ref 0.3–1.2)
Total Protein: 9.1 g/dL — ABNORMAL HIGH (ref 6.5–8.1)

## 2021-10-11 LAB — CBC WITH DIFFERENTIAL (CANCER CENTER ONLY)
Abs Immature Granulocytes: 0.01 10*3/uL (ref 0.00–0.07)
Basophils Absolute: 0 10*3/uL (ref 0.0–0.1)
Basophils Relative: 0 %
Eosinophils Absolute: 0.1 10*3/uL (ref 0.0–0.5)
Eosinophils Relative: 2 %
HCT: 43.5 % (ref 39.0–52.0)
Hemoglobin: 14.4 g/dL (ref 13.0–17.0)
Immature Granulocytes: 0 %
Lymphocytes Relative: 23 %
Lymphs Abs: 1.4 10*3/uL (ref 0.7–4.0)
MCH: 28.6 pg (ref 26.0–34.0)
MCHC: 33.1 g/dL (ref 30.0–36.0)
MCV: 86.5 fL (ref 80.0–100.0)
Monocytes Absolute: 0.5 10*3/uL (ref 0.1–1.0)
Monocytes Relative: 9 %
Neutro Abs: 4 10*3/uL (ref 1.7–7.7)
Neutrophils Relative %: 66 %
Platelet Count: 214 10*3/uL (ref 150–400)
RBC: 5.03 MIL/uL (ref 4.22–5.81)
RDW: 15.1 % (ref 11.5–15.5)
WBC Count: 6 10*3/uL (ref 4.0–10.5)
nRBC: 0 % (ref 0.0–0.2)

## 2021-10-11 LAB — PROTIME-INR
INR: 1.1 (ref 0.8–1.2)
Prothrombin Time: 14 seconds (ref 11.4–15.2)

## 2021-10-11 LAB — APTT: aPTT: 28 seconds (ref 24–36)

## 2021-10-11 MED ORDER — HEPARIN BOLUS VIA INFUSION
5000.0000 [IU] | Freq: Once | INTRAVENOUS | Status: AC
  Administered 2021-10-11: 5000 [IU] via INTRAVENOUS
  Filled 2021-10-11: qty 5000

## 2021-10-11 MED ORDER — HEPARIN (PORCINE) 25000 UT/250ML-% IV SOLN
1350.0000 [IU]/h | INTRAVENOUS | Status: DC
  Administered 2021-10-11: 1350 [IU]/h via INTRAVENOUS
  Filled 2021-10-11: qty 250

## 2021-10-11 MED ORDER — ACETAMINOPHEN 325 MG PO TABS: 650.0000 mg | ORAL_TABLET | Freq: Three times a day (TID) | ORAL | Status: DC | PRN

## 2021-10-11 MED ORDER — OXYCODONE HCL 5 MG PO TABS
5.0000 mg | ORAL_TABLET | Freq: Once | ORAL | Status: AC
  Administered 2021-10-11: 5 mg via ORAL
  Filled 2021-10-11: qty 1

## 2021-10-11 MED ORDER — AMLODIPINE BESYLATE 10 MG PO TABS
10.0000 mg | ORAL_TABLET | Freq: Every day | ORAL | Status: DC
  Administered 2021-10-11 – 2021-10-15 (×4): 10 mg via ORAL
  Filled 2021-10-11 (×5): qty 1

## 2021-10-11 MED ORDER — ALBUTEROL SULFATE (2.5 MG/3ML) 0.083% IN NEBU
2.5000 mg | INHALATION_SOLUTION | Freq: Four times a day (QID) | RESPIRATORY_TRACT | Status: DC
  Administered 2021-10-11: 2.5 mg via RESPIRATORY_TRACT
  Filled 2021-10-11: qty 3

## 2021-10-11 MED ORDER — ENSURE ENLIVE PO LIQD
237.0000 mL | Freq: Two times a day (BID) | ORAL | Status: DC
  Administered 2021-10-13 – 2021-10-14 (×3): 237 mL via ORAL

## 2021-10-11 MED ORDER — ACETAMINOPHEN 650 MG RE SUPP: 650.0000 mg | Freq: Four times a day (QID) | RECTAL | Status: DC | PRN

## 2021-10-11 MED ORDER — CYCLOBENZAPRINE HCL 5 MG PO TABS
5.0000 mg | ORAL_TABLET | Freq: Three times a day (TID) | ORAL | 0 refills | Status: AC | PRN
Start: 2021-10-11 — End: 2021-10-18

## 2021-10-11 MED ORDER — SENNA 8.6 MG PO TABS
1.0000 | ORAL_TABLET | Freq: Two times a day (BID) | ORAL | Status: DC
  Administered 2021-10-11 – 2021-10-15 (×8): 8.6 mg via ORAL
  Filled 2021-10-11 (×8): qty 1

## 2021-10-11 MED ORDER — HYDROCODONE-ACETAMINOPHEN 10-325 MG PO TABS: 1.0000 | ORAL_TABLET | Freq: Four times a day (QID) | ORAL | Status: DC | PRN

## 2021-10-11 MED ORDER — POLYETHYLENE GLYCOL 3350 17 G PO PACK: 17.0000 g | PACK | Freq: Every day | ORAL | Status: DC | PRN

## 2021-10-11 MED ORDER — HYDRALAZINE HCL 20 MG/ML IJ SOLN: 10.0000 mg | Freq: Four times a day (QID) | INTRAMUSCULAR | Status: DC | PRN

## 2021-10-11 MED ORDER — LOSARTAN POTASSIUM 50 MG PO TABS
100.0000 mg | ORAL_TABLET | Freq: Every day | ORAL | Status: DC
Start: 2021-10-11 — End: 2021-10-14
  Administered 2021-10-11 – 2021-10-13 (×3): 100 mg via ORAL
  Filled 2021-10-11 (×3): qty 2

## 2021-10-11 MED ORDER — OXYCODONE HCL 5 MG PO TABS
10.0000 mg | ORAL_TABLET | Freq: Four times a day (QID) | ORAL | Status: DC | PRN
  Administered 2021-10-11 – 2021-10-15 (×10): 10 mg via ORAL
  Filled 2021-10-11 (×10): qty 2

## 2021-10-11 MED ORDER — ALBUTEROL SULFATE (2.5 MG/3ML) 0.083% IN NEBU
2.5000 mg | INHALATION_SOLUTION | RESPIRATORY_TRACT | Status: DC | PRN
Start: 2021-10-11 — End: 2021-10-15

## 2021-10-11 MED ORDER — ACETAMINOPHEN 325 MG PO TABS
650.0000 mg | ORAL_TABLET | Freq: Four times a day (QID) | ORAL | Status: DC | PRN
Start: 2021-10-11 — End: 2021-10-11

## 2021-10-11 MED ORDER — HYDROMORPHONE HCL 1 MG/ML IJ SOLN
0.5000 mg | INTRAMUSCULAR | Status: DC | PRN
  Administered 2021-10-11 – 2021-10-14 (×7): 1 mg via INTRAVENOUS
  Filled 2021-10-11 (×7): qty 1

## 2021-10-11 MED ORDER — OXYCODONE HCL 5 MG PO TABS: 5.0000 mg | ORAL_TABLET | Freq: Four times a day (QID) | ORAL | Status: DC | PRN

## 2021-10-11 MED ORDER — SODIUM CHLORIDE 0.9 % IV SOLN: INTRAVENOUS | Status: DC

## 2021-10-11 NOTE — Progress Notes (Signed)
Bilateral lower extremity venous duplex completed. Refer to "CV Proc" under chart review to view preliminary results.  10/11/2021 3:17 PM Kelby Aline., MHA, RVT, RDCS, RDMS

## 2021-10-11 NOTE — H&P (Signed)
[facility] History and Physical  Franklin Rivera YTK:[id number] DOB: [birth year] DOA: 10/11/2021 PCP: [name], MD  Admitted from: Home Chief Complaint: Leg pain  History of Present Illness: Franklin Rivera is a 71 y.o. male with hepatocellular carcinoma on oral chemotherapy, patient of Dr. Irene Limbo. Patient presented to the cancer center today with left leg pain for 7 to 10 days.  Pain is sharp in left,, worse with ambulation, 10 out of 10 in severity, radiates up to his buttock and down to his foot.  No recent history of injury, long travel.  No previous history of blood clots.   He follows up at the pain clinic and was prescribed hydrocodone with not much symptom improvement.  Over the last few weeks, patient also has poor oral intake, poor hydration, constipation, abdominal pain.  He also stopped taking it medicines. No fever, chills, headache, visual changes, chest pain, shortness of breath, urinary symptoms, diarrhea, vomiting. At the cancer center, he underwent Ultrasound duplex showed extensive bilateral DVT extending up to the common femoral vein.  Thrombus in the right common femoral vein appears to be mobile.   LEFT: - Findings consistent with acute deep vein thrombosis involving the left popliteal vein, left posterior tibial veins, and left peroneal veins. - No cystic structure found in the popliteal fossa.  Covering hematologist Dr. Lorenso Courier recommended inpatient hospitalization for IV heparin drip.  Also recommend IVC filter because of the presence of mobile thrombus.  Continue transition to Lovenox subcu at discharge.  Once his liver enzymes improve, Eliquis would be an option as an outpatient.  Patient was accepted to [facility] service for direct admission.  On arrival to the unit, patient was afebrile, heart rate 97, blood pressure elevated to 167/116, breathing on room air Labs done earlier at the cancer center showed unremarkable CBC, BMP with normal renal function,  potassium but liver enzymes were off.  AST/ALT/alk phos/total bili elevated to 143/265/160/1.3.  At the time of my evaluation, patient was alert, awake, oriented x3.  Very nice gentleman.  Not in distress.  Pain controlled after he received oxycodone earlier.  Family at bedside.  Review of Systems:  All systems were reviewed and were negative unless otherwise mentioned in the HPI   Past medical history: Past Medical History:  Diagnosis Date   Allergy    Cancer ([place]) 02/2021   liver   Headache    Hyperlipidemia     Past surgical history: Past Surgical History:  Procedure Laterality Date   BLADDER SURGERY     IR 3D INDEPENDENT WKST  03/27/2021   IR ANGIOGRAM SELECTIVE EACH ADDITIONAL VESSEL  03/27/2021   IR ANGIOGRAM SELECTIVE EACH ADDITIONAL VESSEL  03/27/2021   IR ANGIOGRAM SELECTIVE EACH ADDITIONAL VESSEL  03/27/2021   IR ANGIOGRAM SELECTIVE EACH ADDITIONAL VESSEL  03/27/2021   IR ANGIOGRAM SELECTIVE EACH ADDITIONAL VESSEL  03/27/2021   IR ANGIOGRAM SELECTIVE EACH ADDITIONAL VESSEL  03/27/2021   IR ANGIOGRAM VISCERAL SELECTIVE  03/27/2021   IR EMBO TUMOR ORGAN ISCHEMIA INFARCT INC GUIDE ROADMAPPING  03/27/2021   IR RADIOLOGIST EVAL & MGMT  03/07/2021   IR RADIOLOGIST EVAL & MGMT  04/16/2021   IR RADIOLOGIST EVAL & MGMT  05/27/2021   IR US GUIDANCE  03/27/2021   NASAL FRACTURE SURGERY     Skull Surgery     fx forehead and graft from L occiput    Social History:  reports that he has never smoked. He has never used smokeless tobacco. He reports that  he does not currently use drugs. He reports that he does not drink alcohol.  Allergies:  No Known Allergies Patient has no known allergies.   Family history:  Family History  Problem Relation Age of Onset   Cancer Neg Hx      Home Meds: Prior to Admission medications   Medication Sig Start Date End Date Taking? Authorizing Provider  amLODipine (NORVASC) 10 MG tablet Take 10 mg by mouth daily. 09/06/21   [provider]  cetirizine (ZYRTEC) 10 MG tablet Take 1 tablet (10 mg total) by mouth daily. 07/11/21   Lincoln Brigham, PA-C  cyclobenzaprine (FLEXERIL) 5 MG tablet Take 1 tablet (5 mg total) by mouth 3 (three) times daily as needed for up to 7 days for muscle spasms. 10/11/21 10/18/21  Barrie Folk, PA-C  HYDROcodone-acetaminophen (NORCO) 10-325 MG tablet Take by mouth. 08/14/21   [provider]  hydrocortisone (ANUSOL-HC) 2.5 % rectal cream Place 1 Application rectally 2 (two) times daily. 09/13/21   Tawnya Crook, MD  lenvatinib 12 mg daily dose (LENVIMA, 12 MG DAILY DOSE,) 3 x 4 MG capsule Take 12 mg (3 capsules) by mouth daily. 09/17/21   Brunetta Genera, MD  losartan (COZAAR) 100 MG tablet Take 100 mg by mouth daily. 09/06/21   [provider]  ondansetron (ZOFRAN) 8 MG tablet Take 1 tablet (8 mg total) by mouth 2 (two) times daily as needed (Nausea or vomiting). 06/17/21   Brunetta Genera, MD  prochlorperazine (COMPAZINE) 10 MG tablet Take 1 tablet (10 mg total) by mouth every 6 (six) hours as needed (Nausea or vomiting). 06/17/21   Brunetta Genera, MD    Physical Exam: Vitals:   10/11/21 1700 10/11/21 1701  BP: (!) 167/116   Pulse: 97 95  Resp: 20   Temp:  97.8 F (36.6 C)  TempSrc:  Oral  SpO2: 98% 98%  Weight:  78.2 kg  Height:  6' (1.829 m)   Wt Readings from Last 3 Encounters:  10/11/21 78.2 kg  10/11/21 78 kg  09/13/21 83.9 kg   Body mass index is 23.38 kg/m.  General exam: Pleasant, not in pain at this time Skin: No rashes, lesions or ulcers. HEENT: Atraumatic, normocephalic, no obvious bleeding Lungs: Clear to auscultation bilaterally CVS: Regular rate and rhythm, no murmur GI/Abd soft, mild right upper quadrant tenderness, nondistended, bowel sound present CNS: Alert, awake abound x3 Psychiatry: Sad affect Extremities: Bilateral calf tenderness, no pedal edema noted    Labs on Admission:   CBC: Recent Labs  Lab  10/11/21 1130  WBC 6.0  NEUTROABS 4.0  HGB 14.4  HCT 43.5  MCV 86.5  PLT 465    Basic Metabolic Panel: Recent Labs  Lab 10/11/21 1130  NA 135  K 4.3  CL 97*  CO2 29  GLUCOSE 86  BUN 19  CREATININE 0.89  CALCIUM 9.8    Liver Function Tests: Recent Labs  Lab 10/11/21 1130  AST 143*  ALT 265*  ALKPHOS 160*  BILITOT 1.3*  PROT 9.1*  ALBUMIN 3.7   No results for input(s): "LIPASE", "AMYLASE" in the last 168 hours. No results for input(s): "AMMONIA" in the last 168 hours.  Cardiac Enzymes: No results for input(s): "CKTOTAL", "CKMB", "CKMBINDEX", "TROPONINI" in the last 168 hours.  BNP (last 3 results) No results for input(s): "BNP" in the last 8760 hours.  ProBNP (last 3 results) No results for input(s): "PROBNP" in the last 8760 hours.  CBG: No results  for input(s): "GLUCAP" in the last 168 hours.  Lipase     Component Value Date/Time   LIPASE 25 01/11/2021 1511     Urinalysis    Component Value Date/Time   COLORURINE YELLOW 01/11/2021 1424   APPEARANCEUR CLEAR 01/11/2021 1424   LABSPEC 1.016 01/11/2021 1424   PHURINE 6.0 01/11/2021 1424   GLUCOSEU NEGATIVE 01/11/2021 1424   [place] 01/11/2021 [place] 01/11/2021 1424   [place] 01/11/2021 1424   PROTEINUR <30 (A) 08/22/2021 1544   NITRITE NEGATIVE 01/11/2021 1424   LEUKOCYTESUR NEGATIVE 01/11/2021 1424     Drugs of Abuse  No results found for: "LABOPIA", "COCAINSCRNUR", "LABBENZ", "AMPHETMU", "THCU", "LABBARB"    Radiological Exams on Admission: VAS [place] LOWER EXTREMITY VENOUS (DVT)  Result Date: 10/11/2021  Lower Venous DVT Study Patient Name:  ORLA JOLLIFF  Date of Exam:   10/11/2021 Medical Rec #: [id number]        Accession #:    [id number] Date of Birth: [birth year]        Patient Gender: M Patient Age:   73 years Exam Location:  [facility] Procedure:      VAS [place] LOWER EXTREMITY VENOUS (DVT) Referring Phys: Sherol Dade  --------------------------------------------------------------------------------  Indications: Pain.  Comparison Study: No prior study Performing Technologist: Maudry Mayhew MHA, RDMS, RVT, RDCS  Examination Guidelines: A complete evaluation includes B-mode imaging, spectral Doppler, color Doppler, and power Doppler as needed of all accessible portions of each vessel. Bilateral testing is considered an integral part of a complete examination. Limited examinations for reoccurring indications may be performed as noted. The reflux portion of the exam is performed with the patient in reverse Trendelenburg.  +---------+---------------+---------+-----------+----------+------------------+ RIGHT    CompressibilityPhasicitySpontaneityPropertiesThrombus Aging     +---------+---------------+---------+-----------+----------+------------------+ CFV                     Yes      Yes                  Mobile acute                                                             thrombus           +---------+---------------+---------+-----------+----------+------------------+ SFJ                              Yes                  Acute              +---------+---------------+---------+-----------+----------+------------------+ FV Prox                          No                   Acute              +---------+---------------+---------+-----------+----------+------------------+ FV Mid   None                                         Acute              +---------+---------------+---------+-----------+----------+------------------+  FV DistalNone                                         Acute              +---------+---------------+---------+-----------+----------+------------------+ PFV                              No                   Acute              +---------+---------------+---------+-----------+----------+------------------+ POP      Full           Yes      Yes                                      +---------+---------------+---------+-----------+----------+------------------+ PTV      Full                    Yes                                     +---------+---------------+---------+-----------+----------+------------------+ PERO     Full                    Yes                                     +---------+---------------+---------+-----------+----------+------------------+ EIV                     Yes      Yes                                     +---------+---------------+---------+-----------+----------+------------------+ CIV                     Yes      Yes                                     +---------+---------------+---------+-----------+----------+------------------+ Unable to visualize IVC secondary to overlying bowel gas. All compression maneuvers not performed secondary to mobile CFV thrombus.  +---------+---------------+---------+-----------+----------+--------------+ LEFT     CompressibilityPhasicitySpontaneityPropertiesThrombus Aging +---------+---------------+---------+-----------+----------+--------------+ CFV      Full           Yes      Yes                                 +---------+---------------+---------+-----------+----------+--------------+ SFJ      Full                                                        +---------+---------------+---------+-----------+----------+--------------+ FV Prox  Full                                                        +---------+---------------+---------+-----------+----------+--------------+  FV Mid   Full                                                        +---------+---------------+---------+-----------+----------+--------------+ FV DistalFull                                                        +---------+---------------+---------+-----------+----------+--------------+ PFV      Full                                                         +---------+---------------+---------+-----------+----------+--------------+ POP      None                    No                   Acute          +---------+---------------+---------+-----------+----------+--------------+ PTV      None                    No                   Acute          +---------+---------------+---------+-----------+----------+--------------+ PERO     None                    No                   Acute          +---------+---------------+---------+-----------+----------+--------------+ EIV                     Yes      Yes                                 +---------+---------------+---------+-----------+----------+--------------+ CIV                     Yes      Yes                                 +---------+---------------+---------+-----------+----------+--------------+     Summary: RIGHT: - Findings consistent with acute deep vein thrombosis involving the right common femoral vein, right femoral vein, and right proximal profunda vein. Thrombus in the right common femoral vein appears to be mobile. - No cystic structure found in the popliteal fossa.  LEFT: - Findings consistent with acute deep vein thrombosis involving the left popliteal vein, left posterior tibial veins, and left peroneal veins. - No cystic structure found in the popliteal fossa.  *See table(s) above for measurements and observations.    Preliminary      ------------------------------------------------------------------------------------------------------ Assessment/Plan: Principal Problem:   Acute bilateral deep vein thrombosis (DVT) of femoral veins (HCC)  Extensive bilateral lower extremity DVT -Primary presented with leg pain left more than right. -  Ultrasound duplex showed extensive bilateral DVT extending up to the common femoral vein.  Thrombus in the right common femoral vein appears to be mobile. -Because of the severity of the DVT and the presence of a mobile clot,  hematologist Dr. Lorenso Courier recommended inpatient hospitalization for IV heparin drip. -He also recommended IVC filter because of the presence of mobile thrombus.  Tentative plan to transition to Lovenox subcu at discharge.  Once his liver enzymes improve, Eliquis would be an option as an outpatient. -IV morphine, Percocet as needed for pain  Elevated liver enzymes -Liver enzymes as well as bilirubin elevated compared to last LFT check from May.  Probably related to hepatocellular cancer -Per hematologist, patient cannot have NOAC before improvement in LFTs. -Continue to monitor Recent Labs  Lab 10/11/21 1130  AST 143*  ALT 265*  ALKPHOS 160*  BILITOT 1.3*  PROT 9.1*  ALBUMIN 3.7   Essential hypertension -PTA on amlodipine, losartan. -Blood pressure elevated on arrival today.  Apparently patient had not been taking his medications for the last few days. -Resume both.  Continue to monitor blood pressure.  Constipation -Last BM 2 days ago.  Starting scheduled Senokot and as needed MiraLAX.  Mobility -Encourage ambulation  Goals of care - -  Code Status: Full Code full code   Diet:  Diet Order             Diet NPO time specified Except for: Sips with Meds  Diet effective midnight           Diet regular Room service appropriate? Yes; Fluid consistency: Thin  Diet effective now                  DVT prophylaxis:  heparin bolus via infusion 5,000 Units Start: 10/11/21 1830   Antimicrobials: None Fluid:  start NS at 75 mill per hour  Consultants: Hematology, IR Family Communication: Wife at bedside Dispo: The patient is from: Home              Anticipated d/c is to: Home.  Pending clinical course              Anticipated d/c date is: > 3 days  ------------------------------------------------------------------------------------- Severity of Illness: The appropriate patient status for this patient is OBSERVATION. Observation status is judged to be reasonable and  necessary in order to provide the required intensity of service to ensure the patient's safety. The patient's presenting symptoms, physical exam findings, and initial radiographic and laboratory data in the context of their medical condition is felt to place them at decreased risk for further clinical deterioration. Furthermore, it is anticipated that the patient will be medically stable for discharge from the hospital within 2 midnights of admission.    Signed, Terrilee Croak, MD [facility] 10/11/2021

## 2021-10-11 NOTE — Progress Notes (Signed)
Symptom Management Consult note [place]    Patient Care Team: [name], MD as PCP - General (Family Medicine) Golden Circle, FNP as Nurse Practitioner (Family Medicine) [facility] as Consulting Physician Rozetta Nunnery, MD (Inactive) as Consulting Physician (Otolaryngology)    Name of the patient: Franklin Rivera  [id number]  11/05/1950   Date of visit: 10/11/2021    Chief complaint/ Reason for visit- leg pain  Oncology History  Hepatocellular carcinoma ([place])  02/12/2021 Initial Diagnosis   Hepatocellular carcinoma ([place])   03/11/2021 Cancer Staging   Staging form: Liver, AJCC 8th Edition - Clinical: Stage II (cT2, cN0, cM0) - Signed by Brunetta Genera, MD on 03/11/2021 Histologic grade (G): GX Histologic grading system: 4 grade system   06/20/2021 -  Chemotherapy   Patient is on Treatment Plan : LUNG Atezolizumab + Bevacizumab q21d Maintenance       Current Therapy: Lenvima    Interval history- [name] is a 71 y.o. with oncologic history as above presenting to [facility] today with chief complaint of left leg pain x 1.5 weeks.  Patient spouse accompanies him and provides additional history.  Patient reports sharp pain in his left calf.  Pain is worse with ambulation.  He rates the pain 10 out of 10 in severity.  He states with ambulation it radiates up to his buttock and down to his foot.  Patient denies any history of blood clots.  He denies any recent injury to his leg.  He sees a pain clinic and is prescribed hydrocodone which she has been taking without much symptom improvement.  Patient also adds that he has had decreased appetite over the last couple of weeks and dry mouth.  He is also experiencing constipation which is slightly improved after taking MiraLAX and drinking prune juice.  His last bowel movement was x2 days ago.  Patient admits that he always has abdominal pain because of his cancer and that the  pain is unchanged today.  Patient tells me that he stopped taking his blood pressure pills x4 days ago because at a recent doctor's visit he had abnormal blood pressure and he is tired of being on so many pills.  He denies any fever, chills, headache, visual changes, chest pain, shortness of breath, syncope, palpitations, hemoptysis, urinary symptoms, lower extremity numbness or weakness.  He did not try any over-the-counter medications prior to arrival today.    ROS  All other systems are reviewed and are negative for acute change except as noted in the HPI.    No Known Allergies   Past Medical History:  Diagnosis Date   Allergy    Cancer ([place]) 02/2021   liver   Headache    Hyperlipidemia      Past Surgical History:  Procedure Laterality Date   BLADDER SURGERY     IR 3D INDEPENDENT WKST  03/27/2021   IR ANGIOGRAM SELECTIVE EACH ADDITIONAL VESSEL  03/27/2021   IR ANGIOGRAM SELECTIVE EACH ADDITIONAL VESSEL  03/27/2021   IR ANGIOGRAM SELECTIVE EACH ADDITIONAL VESSEL  03/27/2021   IR ANGIOGRAM SELECTIVE EACH ADDITIONAL VESSEL  03/27/2021   IR ANGIOGRAM SELECTIVE EACH ADDITIONAL VESSEL  03/27/2021   IR ANGIOGRAM SELECTIVE EACH ADDITIONAL VESSEL  03/27/2021   IR ANGIOGRAM VISCERAL SELECTIVE  03/27/2021   IR EMBO TUMOR ORGAN ISCHEMIA INFARCT INC GUIDE ROADMAPPING  03/27/2021   IR RADIOLOGIST EVAL & MGMT  03/07/2021   IR RADIOLOGIST EVAL & MGMT  04/16/2021   IR RADIOLOGIST EVAL & MGMT  05/27/2021   IR US GUIDANCE  03/27/2021   NASAL FRACTURE SURGERY     Skull Surgery     fx forehead and graft from L occiput    Social History   Socioeconomic History   Marital status: Married    Spouse name: Not on file   Number of children: Not on file   Years of education: Not on file   Highest education level: Not on file  Occupational History   Not on file  Tobacco Use   Smoking status: Never   Smokeless tobacco: Never  Vaping Use   Vaping Use: Never used  Substance and Sexual  Activity   Alcohol use: Never   Drug use: Not Currently   Sexual activity: Not on file  Other Topics Concern   Not on file  Social History Narrative   Not on file   Social Determinants of Health   Financial Resource Strain: Low Risk  (08/27/2021)   Overall Financial Resource Strain (CARDIA)    Difficulty of Paying Living Expenses: Not hard at all  Food Insecurity: No Food Insecurity (08/27/2021)   Hunger Vital Sign    Worried About Running Out of Food in the Last Year: Never true    Ran Out of Food in the Last Year: Never true  Transportation Needs: No Transportation Needs (08/27/2021)   PRAPARE - Hydrologist (Medical): No    Lack of Transportation (Non-Medical): No  Physical Activity: Insufficiently Active (08/27/2021)   Exercise Vital Sign    Days of Exercise per Week: 3 days    Minutes of Exercise per Session: 30 min  Stress: No Stress Concern Present (08/27/2021)   [place]    Feeling of Stress : Not at all  Social Connections: Moderately Isolated (08/27/2021)   Social Connection and Isolation Panel [NHANES]    Frequency of Communication with Friends and Family: More than three times a week    Frequency of Social Gatherings with Friends and Family: More than three times a week    Attends Religious Services: Never    Marine scientist or Organizations: No    Attends Archivist Meetings: Never    Marital Status: Married  Human resources officer Violence: Not At Risk (08/27/2021)   Humiliation, Afraid, Rape, and Kick questionnaire    Fear of Current or Ex-Partner: No    Emotionally Abused: No    Physically Abused: No    Sexually Abused: No    Family History  Problem Relation Age of Onset   Cancer Neg Hx      Current Outpatient Medications:    cyclobenzaprine (FLEXERIL) 5 MG tablet, Take 1 tablet (5 mg total) by mouth 3 (three) times daily as needed for up to 7 days  for muscle spasms., Disp: 21 tablet, Rfl: 0   amLODipine (NORVASC) 10 MG tablet, Take 10 mg by mouth daily., Disp: , Rfl:    cetirizine (ZYRTEC) 10 MG tablet, Take 1 tablet (10 mg total) by mouth daily., Disp: 90 tablet, Rfl: 0   HYDROcodone-acetaminophen (NORCO) 10-325 MG tablet, Take by mouth., Disp: , Rfl:    hydrocortisone (ANUSOL-HC) 2.5 % rectal cream, Place 1 Application rectally 2 (two) times daily., Disp: 30 g, Rfl: 0   lenvatinib 12 mg daily dose (LENVIMA, 12 MG DAILY DOSE,) 3 x 4 MG capsule, Take 12 mg (3 capsules) by mouth daily., Disp: 90  capsule, Rfl: 1   losartan (COZAAR) 100 MG tablet, Take 100 mg by mouth daily., Disp: , Rfl:    ondansetron (ZOFRAN) 8 MG tablet, Take 1 tablet (8 mg total) by mouth 2 (two) times daily as needed (Nausea or vomiting)., Disp: 30 tablet, Rfl: 1   prochlorperazine (COMPAZINE) 10 MG tablet, Take 1 tablet (10 mg total) by mouth every 6 (six) hours as needed (Nausea or vomiting)., Disp: 30 tablet, Rfl: 1  PHYSICAL EXAM: ECOG FS:2 - Symptomatic, <50% confined to bed    Vitals:   10/11/21 1153 10/11/21 1336  BP: (!) 156/114 (!) 148/105  Pulse: (!) 101 99  Resp: 17 16  Temp:  (!) 97.5 F (36.4 C)  TempSrc:  Oral  SpO2: 98% 98%  Weight: 172 lb (78 kg)    Physical Exam Vitals and nursing note reviewed.  Constitutional:      Appearance: He is well-developed. He is not ill-appearing or toxic-appearing.  HENT:     Head: Normocephalic and atraumatic.     Right Ear: External ear normal.     Left Ear: External ear normal.     Nose: Nose normal.     Mouth/Throat:     Mouth: Mucous membranes are dry.  Eyes:     General: No scleral icterus.       Right eye: No discharge.        Left eye: No discharge.     Conjunctiva/sclera: Conjunctivae normal.  Neck:     Vascular: No JVD.  Cardiovascular:     Rate and Rhythm: Regular rhythm. Tachycardia present.     Pulses: Normal pulses.     Heart sounds: Normal heart sounds.  Pulmonary:     Effort:  Pulmonary effort is normal. No respiratory distress.     Breath sounds: Normal breath sounds. No stridor. No wheezing, rhonchi or rales.  Chest:     Chest wall: No tenderness.  Abdominal:     General: Bowel sounds are normal. There is distension.     Tenderness: There is no abdominal tenderness. There is no right CVA tenderness, left CVA tenderness, guarding or rebound.  Musculoskeletal:        General: Normal range of motion.     Cervical back: Normal range of motion.     Right lower leg: No edema.     Left lower leg: No edema.     Comments: No tenderness to palpation of cervical, thoracic or lumbar spinous processes.  No step-off or deformity.  No swelling of bilateral lower extremities.  Positive Homans' sign on left lower extremity.  Skin:    General: Skin is warm and dry.     Comments: Equal tactile temperature in bilateral lower extremities  Neurological:     Mental Status: He is oriented to person, place, and time.     GCS: GCS eye subscore is 4. GCS verbal subscore is 5. GCS motor subscore is 6.     Comments: Fluent speech, no facial droop.  Psychiatric:        Behavior: Behavior normal.        LABORATORY DATA: I have reviewed the data as listed    Latest Ref Rng & Units 10/11/2021   11:30 AM 08/22/2021    1:16 PM 08/01/2021   12:41 PM  CBC  WBC 4.0 - 10.5 K/uL 6.0  4.9  4.0   Hemoglobin 13.0 - 17.0 g/dL 14.4  13.3  13.3   Hematocrit 39.0 - 52.0 % 43.5  40.4  40.3   Platelets 150 - 400 K/uL 214  214  192         Latest Ref Rng & Units 10/11/2021   11:30 AM 08/22/2021    1:16 PM 08/01/2021   12:41 PM  CMP  Glucose 70 - 99 mg/dL 86  120  106   BUN 8 - 23 mg/dL '19  21  18   '$ Creatinine 0.61 - 1.24 mg/dL 0.89  0.78  0.75   Sodium 135 - 145 mmol/L 135  137  136   Potassium 3.5 - 5.1 mmol/L 4.3  4.1  4.1   Chloride 98 - 111 mmol/L 97  104  103   CO2 22 - 32 mmol/L '29  25  27   '$ Calcium 8.9 - 10.3 mg/dL 9.8  9.1  9.2   Total Protein 6.5 - 8.1 g/dL 9.1  8.0  7.8    Total Bilirubin 0.3 - 1.2 mg/dL 1.3  0.4  0.5   Alkaline Phos 38 - 126 U/L 160  205  165   AST 15 - 41 U/L 143  67  56   ALT 0 - 44 U/L 265  94  72        RADIOGRAPHIC STUDIES (from last 24 hours if applicable) I have personally reviewed the radiological images as listed and agreed with the findings in the report. VAS [place] LOWER EXTREMITY VENOUS (DVT)  Result Date: 10/11/2021  Lower Venous DVT Study Patient Name:  Franklin Rivera  Date of Exam:   10/11/2021 Medical Rec #: [id number]        Accession #:    [id number] Date of Birth: Aug 27, 1950        Patient Gender: M Patient Age:   72 years Exam Location:  [facility] Procedure:      VAS [place] LOWER EXTREMITY VENOUS (DVT) Referring Phys: Sherol Dade --------------------------------------------------------------------------------  Indications: Pain.  Comparison Study: No prior study Performing Technologist: Maudry Mayhew MHA, RDMS, RVT, RDCS  Examination Guidelines: A complete evaluation includes B-mode imaging, spectral Doppler, color Doppler, and power Doppler as needed of all accessible portions of each vessel. Bilateral testing is considered an integral part of a complete examination. Limited examinations for reoccurring indications may be performed as noted. The reflux portion of the exam is performed with the patient in reverse Trendelenburg.  +---------+---------------+---------+-----------+----------+------------------+ RIGHT    CompressibilityPhasicitySpontaneityPropertiesThrombus Aging     +---------+---------------+---------+-----------+----------+------------------+ CFV                     Yes      Yes                  Mobile acute                                                             thrombus           +---------+---------------+---------+-----------+----------+------------------+ SFJ                              Yes                  Acute               +---------+---------------+---------+-----------+----------+------------------+ FV Prox  No                   Acute              +---------+---------------+---------+-----------+----------+------------------+ FV Mid   None                                         Acute              +---------+---------------+---------+-----------+----------+------------------+ FV DistalNone                                         Acute              +---------+---------------+---------+-----------+----------+------------------+ PFV                              No                   Acute              +---------+---------------+---------+-----------+----------+------------------+ POP      Full           Yes      Yes                                     +---------+---------------+---------+-----------+----------+------------------+ PTV      Full                    Yes                                     +---------+---------------+---------+-----------+----------+------------------+ PERO     Full                    Yes                                     +---------+---------------+---------+-----------+----------+------------------+ EIV                     Yes      Yes                                     +---------+---------------+---------+-----------+----------+------------------+ CIV                     Yes      Yes                                     +---------+---------------+---------+-----------+----------+------------------+ Unable to visualize IVC secondary to overlying bowel gas. All compression maneuvers not performed secondary to mobile CFV thrombus.  +---------+---------------+---------+-----------+----------+--------------+ LEFT     CompressibilityPhasicitySpontaneityPropertiesThrombus Aging +---------+---------------+---------+-----------+----------+--------------+ CFV      Full           Yes      Yes                                  +---------+---------------+---------+-----------+----------+--------------+  SFJ      Full                                                        +---------+---------------+---------+-----------+----------+--------------+ FV Prox  Full                                                        +---------+---------------+---------+-----------+----------+--------------+ FV Mid   Full                                                        +---------+---------------+---------+-----------+----------+--------------+ FV DistalFull                                                        +---------+---------------+---------+-----------+----------+--------------+ PFV      Full                                                        +---------+---------------+---------+-----------+----------+--------------+ POP      None                    No                   Acute          +---------+---------------+---------+-----------+----------+--------------+ PTV      None                    No                   Acute          +---------+---------------+---------+-----------+----------+--------------+ PERO     None                    No                   Acute          +---------+---------------+---------+-----------+----------+--------------+ EIV                     Yes      Yes                                 +---------+---------------+---------+-----------+----------+--------------+ CIV                     Yes      Yes                                 +---------+---------------+---------+-----------+----------+--------------+     Summary: RIGHT: -  Findings consistent with acute deep vein thrombosis involving the right common femoral vein, right femoral vein, and right proximal profunda vein. Thrombus in the right common femoral vein appears to be mobile. - No cystic structure found in the popliteal fossa.  LEFT: - Findings consistent with acute deep vein thrombosis  involving the left popliteal vein, left posterior tibial veins, and left peroneal veins. - No cystic structure found in the popliteal fossa.  *See table(s) above for measurements and observations.    Preliminary        ASSESSMENT & PLAN: Patient is a 71 y.o. male  with oncologic history of hepatocellular carcinoma followed by Dr. Irene Limbo.  I have viewed most recent oncology note and lab work.   #)Left leg pain- Patient with extensive DVT in bilateral legs per report above with concerning finding for thrombus in the right common femoral vein appears to be mobile. Patient's oncologist Dr. Irene Limbo is out of the office today. I discussed findings with Dr. Lorenso Courier who is recommending IVC filter placed as there is a mobile thrombus. He can be started on heparin drip and transitioned to lovenox once the filter is in place. Eliquis should be avoided until his LFTs start to clear up.  #)Hypertension- Patient stopped taking his blood pressure medicine this weekly and now is hypertensive. He has a normal neuro exam.  #) Hepatocellular carcinoma- Patient had recent MR of liver 08/16/21 that showed disease progression. He last saw oncologist 09/17/21 and treatment was changed to second line therapy Lenvima. His CMP today show worsening LFTs: AST 143 and ALT 265 when previous have been in the 60s and 90s previously. He also has elevated t bilirubin at 1.3. Plan was to hold his chemo Lenvima for 1 week and see him for lab recheck in [facility] clinic.   Discussed case with hospitalist Dr. Pietro Cassis who accepts patient for admission.   Visit Diagnosis: 1. Left leg pain   2. Acute deep vein thrombosis (DVT) of both lower extremities, unspecified vein (HCC)   3. Hepatocellular carcinoma (HCC)   4. Elevated LFTs   5. Hypertension, unspecified type      No orders of the defined types were placed in this encounter.   All questions were answered. The patient knows to call the clinic with any problems, questions or concerns. No  barriers to learning was detected.  I have spent a total of 30 minutes minutes of face-to-face and non-face-to-face time, preparing to see the patient, obtaining and/or reviewing separately obtained history, performing a medically appropriate examination, counseling and educating the patient, ordering tests, documenting clinical information in the electronic health record, and care coordination (communications with other health care professionals or caregivers).    Thank you for allowing me to participate in the care of this patient.    Barrie Folk, PA-C Department of Hematology/Oncology [facility] at [facility] Phone: [phone]  [phone]    10/11/2021 4:21 PM

## 2021-10-11 NOTE — Progress Notes (Signed)
ANTICOAGULATION CONSULT NOTE - Initial Consult  Pharmacy Consult for Heparin Indication: DVT  No Known Allergies  Patient Measurements: Height: 6' (182.9 cm) Weight: 78.2 kg (172 lb 6.4 oz) IBW/kg (Calculated) : 77.6 Heparin Dosing Weight: actual   Vital Signs: Temp: 97.8 F (36.6 C) (07/14 1701) Temp Source: Oral (07/14 1701) BP: 167/116 (07/14 1700) Pulse Rate: 95 (07/14 1701)  Labs: Recent Labs    10/11/21 1130  HGB 14.4  HCT 43.5  PLT 214  CREATININE 0.89    Estimated Creatinine Clearance: 84.8 mL/min (by C-G formula based on SCr of 0.89 mg/dL).   Medical History: Past Medical History:  Diagnosis Date   Allergy    Cancer ([place]) 02/2021   liver   Headache    Hyperlipidemia     Medications:  Scheduled:   albuterol  2.5 mg Nebulization Q6H   amLODipine  10 mg Oral Daily   losartan  100 mg Oral Daily   senna  1 tablet Oral BID   Infusions:  PRN: acetaminophen **OR** acetaminophen, hydrALAZINE, HYDROcodone-acetaminophen, HYDROmorphone (DILAUDID) injection, polyethylene glycol  Assessment: 71 yo male with hepatocellular carcinoma on oral chemotherapy admitted with extensive bilateral lower extremity DVT extending up to the common femoral vein; thrombus in the right common femoral vein appears to be mobile.  Pharmacy consulted to dose IV heparin.  Plan is for IVC filter placement and transition to Lovenox.  Eventual plan for Eliquis when LFTs improve.  Baseline aPTT, PT/INR pending CBC: WNL SCr 0.89, Crcl ~85 ml/min No bleeding currently reported No anticoagulants/antiplatelets PTA  Goal of Therapy:  Heparin level 0.3-0.7 units/ml Monitor platelets by anticoagulation protocol: Yes   Plan:  Give 5000 units bolus x 1 Start heparin infusion at 1350 units/hr Check anti-Xa level in 8 hours and daily while on heparin Continue to monitor H&H and platelets  Peggyann Juba, PharmD, BCPS Pharmacy: [phone] 10/11/2021,5:32 PM

## 2021-10-12 DIAGNOSIS — K5900 Constipation, unspecified: Secondary | ICD-10-CM | POA: Diagnosis present

## 2021-10-12 DIAGNOSIS — D630 Anemia in neoplastic disease: Secondary | ICD-10-CM | POA: Diagnosis present

## 2021-10-12 DIAGNOSIS — Z6823 Body mass index (BMI) 23.0-23.9, adult: Secondary | ICD-10-CM | POA: Diagnosis not present

## 2021-10-12 DIAGNOSIS — R748 Abnormal levels of other serum enzymes: Secondary | ICD-10-CM | POA: Diagnosis present

## 2021-10-12 DIAGNOSIS — C220 Liver cell carcinoma: Secondary | ICD-10-CM | POA: Diagnosis present

## 2021-10-12 DIAGNOSIS — Z79899 Other long term (current) drug therapy: Secondary | ICD-10-CM | POA: Diagnosis not present

## 2021-10-12 DIAGNOSIS — R269 Unspecified abnormalities of gait and mobility: Secondary | ICD-10-CM | POA: Diagnosis present

## 2021-10-12 DIAGNOSIS — M79605 Pain in left leg: Secondary | ICD-10-CM | POA: Diagnosis present

## 2021-10-12 DIAGNOSIS — I82432 Acute embolism and thrombosis of left popliteal vein: Secondary | ICD-10-CM | POA: Diagnosis present

## 2021-10-12 DIAGNOSIS — E785 Hyperlipidemia, unspecified: Secondary | ICD-10-CM | POA: Diagnosis present

## 2021-10-12 DIAGNOSIS — E440 Moderate protein-calorie malnutrition: Secondary | ICD-10-CM | POA: Diagnosis present

## 2021-10-12 DIAGNOSIS — I10 Essential (primary) hypertension: Secondary | ICD-10-CM | POA: Diagnosis present

## 2021-10-12 DIAGNOSIS — E559 Vitamin D deficiency, unspecified: Secondary | ICD-10-CM | POA: Diagnosis present

## 2021-10-12 DIAGNOSIS — I82411 Acute embolism and thrombosis of right femoral vein: Secondary | ICD-10-CM | POA: Diagnosis present

## 2021-10-12 DIAGNOSIS — I82413 Acute embolism and thrombosis of femoral vein, bilateral: Secondary | ICD-10-CM | POA: Diagnosis not present

## 2021-10-12 DIAGNOSIS — I82442 Acute embolism and thrombosis of left tibial vein: Secondary | ICD-10-CM | POA: Diagnosis present

## 2021-10-12 DIAGNOSIS — N179 Acute kidney failure, unspecified: Secondary | ICD-10-CM | POA: Diagnosis not present

## 2021-10-12 DIAGNOSIS — I82452 Acute embolism and thrombosis of left peroneal vein: Secondary | ICD-10-CM | POA: Diagnosis present

## 2021-10-12 LAB — HEPARIN LEVEL (UNFRACTIONATED)
Heparin Unfractionated: >1.1 IU/mL — ABNORMAL HIGH (ref 0.30–0.70)
Heparin Unfractionated: 1.04 IU/mL — ABNORMAL HIGH (ref 0.30–0.70)
Heparin Unfractionated: 0.89 IU/mL — ABNORMAL HIGH (ref 0.30–0.70)

## 2021-10-12 LAB — CBC WITH DIFFERENTIAL/PLATELET
Abs Immature Granulocytes: 0.04 10*3/uL (ref 0.00–0.07)
Basophils Absolute: 0 10*3/uL (ref 0.0–0.1)
Basophils Relative: 0 %
Eosinophils Absolute: 0.2 10*3/uL (ref 0.0–0.5)
Eosinophils Relative: 2 %
HCT: 41 % (ref 39.0–52.0)
Hemoglobin: 13.1 g/dL (ref 13.0–17.0)
Immature Granulocytes: 1 %
Lymphocytes Relative: 22 %
Lymphs Abs: 1.7 10*3/uL (ref 0.7–4.0)
MCH: 28 pg (ref 26.0–34.0)
MCHC: 32 g/dL (ref 30.0–36.0)
MCV: 87.6 fL (ref 80.0–100.0)
Monocytes Absolute: 0.8 10*3/uL (ref 0.1–1.0)
Monocytes Relative: 10 %
Neutro Abs: 4.8 10*3/uL (ref 1.7–7.7)
Neutrophils Relative %: 65 %
Platelets: 226 10*3/uL (ref 150–400)
RBC: 4.68 MIL/uL (ref 4.22–5.81)
RDW: 15.1 % (ref 11.5–15.5)
WBC: 7.4 10*3/uL (ref 4.0–10.5)
nRBC: 0 % (ref 0.0–0.2)

## 2021-10-12 LAB — COMPREHENSIVE METABOLIC PANEL
ALT: 242 U/L — ABNORMAL HIGH (ref 0–44)
AST: 135 U/L — ABNORMAL HIGH (ref 15–41)
Albumin: 3 g/dL — ABNORMAL LOW (ref 3.5–5.0)
Alkaline Phosphatase: 119 U/L (ref 38–126)
Anion gap: 14 (ref 5–15)
BUN: 20 mg/dL (ref 8–23)
CO2: 20 mmol/L — ABNORMAL LOW (ref 22–32)
Calcium: 8.8 mg/dL — ABNORMAL LOW (ref 8.9–10.3)
Chloride: 101 mmol/L (ref 98–111)
Creatinine, Ser: 0.92 mg/dL (ref 0.61–1.24)
GFR, Estimated: >60 mL/min (ref >60)
Glucose, Bld: 51 mg/dL — ABNORMAL LOW (ref 70–99)
Potassium: 4.9 mmol/L (ref 3.5–5.1)
Sodium: 135 mmol/L (ref 135–145)
Total Bilirubin: 1.2 mg/dL (ref 0.3–1.2)
Total Protein: 8.1 g/dL (ref 6.5–8.1)

## 2021-10-12 LAB — AMMONIA: Ammonia: 32 umol/L (ref 9–35)

## 2021-10-12 LAB — PROTIME-INR
INR: 1.3 — ABNORMAL HIGH (ref 0.8–1.2)
Prothrombin Time: 15.7 seconds — ABNORMAL HIGH (ref 11.4–15.2)

## 2021-10-12 MED ORDER — ALUM & MAG HYDROXIDE-SIMETH 200-200-20 MG/5ML PO SUSP
15.0000 mL | Freq: Four times a day (QID) | ORAL | Status: DC | PRN
Start: 2021-10-12 — End: 2021-10-15
  Administered 2021-10-12 – 2021-10-14 (×2): 15 mL via ORAL
  Filled 2021-10-12 (×2): qty 30

## 2021-10-12 MED ORDER — HEPARIN (PORCINE) 25000 UT/250ML-% IV SOLN
800.0000 [IU]/h | INTRAVENOUS | Status: AC
  Administered 2021-10-12 (×2): 1150 [IU]/h via INTRAVENOUS
  Administered 2021-10-13: 800 [IU]/h via INTRAVENOUS
  Filled 2021-10-12 (×2): qty 250

## 2021-10-12 MED ORDER — LIP MEDEX EX OINT
TOPICAL_OINTMENT | CUTANEOUS | Status: DC | PRN
  Administered 2021-10-12: 75 via TOPICAL
  Filled 2021-10-12: qty 7

## 2021-10-12 NOTE — Progress Notes (Signed)
ANTICOAGULATION CONSULT NOTE - follow up  Pharmacy Consult for Heparin Indication: DVT  No Known Allergies  Patient Measurements: Height: 6' (182.9 cm) Weight: 78.2 kg (172 lb 6.4 oz) IBW/kg (Calculated) : 77.6 Heparin Dosing Weight: actual   Vital Signs: Temp: 98.2 F (36.8 C) (07/15 1951) Temp Source: Oral (07/15 1950) BP: 116/88 (07/15 1950) Pulse Rate: 99 (07/15 1950)  Labs: Recent Labs    10/11/21 1130 10/11/21 1846 10/12/21 0347 10/12/21 0352 10/12/21 1208 10/12/21 2235  HGB 14.4  --   --  13.1  --   --   HCT 43.5  --   --  41.0  --   --   PLT 214  --   --  226  --   --   APTT  --  28  --   --   --   --   LABPROT  --  14.0  --  15.7*  --   --   INR  --  1.1  --  1.3*  --   --   HEPARINUNFRC  --   --  >1.10*  --  1.04* 0.89*  CREATININE 0.89  --   --  0.92  --   --      Estimated Creatinine Clearance: 82 mL/min (by C-G formula based on SCr of 0.92 mg/dL).   Medical History: Past Medical History:  Diagnosis Date   Allergy    Cancer ([place]) 02/2021   liver   Headache    Hyperlipidemia     Medications:  Scheduled:   amLODipine  10 mg Oral Daily   feeding supplement  237 mL Oral BID BM   losartan  100 mg Oral Daily   senna  1 tablet Oral BID   Infusions:   heparin 1,000 Units/hr (10/12/21 1342)   PRN: acetaminophen **OR** acetaminophen, albuterol, alum & mag hydroxide-simeth, hydrALAZINE, HYDROmorphone (DILAUDID) injection, lip balm, oxyCODONE, polyethylene glycol  Assessment: 71 yo male with hepatocellular carcinoma on oral chemotherapy admitted with extensive bilateral lower extremity DVT extending up to the common femoral vein; thrombus in the right common femoral vein appears to be mobile.  Pharmacy consulted to dose IV heparin.  Plan is for IVC filter placement and transition to Lovenox.  Eventual plan for Eliquis when LFTs improve.  Baseline aPTT, PT/INR WNL CBC: WNL SCr 0.89, Crcl ~85 ml/min No bleeding currently reported No  anticoagulants/antiplatelets PTA  10/12/2021 HL 0.89 supra-therapeutic on 1000 units/hr No line or bleeding issues noted per RN    Goal of Therapy:  Heparin level 0.3-0.7 units/ml Monitor platelets by anticoagulation protocol: Yes   Plan:  Decrease heparin drip to 850 units/hr Check heparin level 8 hours after rate change  Daily CBC    Dolly Rias RPh 10/12/2021, 11:24 PM

## 2021-10-12 NOTE — Progress Notes (Signed)
PROGRESS NOTE  Franklin Rivera  DOB: [birth year]  PCP: [name], MD UYQ:[id number]  DOA: 10/11/2021  LOS: 0 days  Hospital Day: 2  Brief narrative: Franklin Rivera is a 71 y.o. male with PMH significant for hepatocellular carcinoma on oral chemotherapy, patient of Dr. Irene Limbo. Patient presented to the cancer center today with left leg pain for 7 to 10 days.  Pain is sharp in left,, worse with ambulation, 10 out of 10 in severity, radiates up to his buttock and down to his foot.  No recent history of injury, long travel.  No previous history of blood clots.   He follows up at the pain clinic and was prescribed hydrocodone with not much symptom improvement.  Over the last few weeks, patient also has poor oral intake, poor hydration, constipation, abdominal pain.  He also stopped taking it medicines. No fever, chills, headache, visual changes, chest pain, shortness of breath, urinary symptoms, diarrhea, vomiting. At the cancer center, he underwent Ultrasound duplex showed extensive bilateral DVT extending up to the common femoral vein.  Thrombus in the right common femoral vein appears to be mobile.   LEFT: - Findings consistent with acute deep vein thrombosis involving the left popliteal vein, left posterior tibial veins, and left peroneal veins. - No cystic structure found in the popliteal fossa.   Covering hematologist Dr. Lorenso Courier recommended inpatient hospitalization for IV heparin drip.  Also recommend IVC filter because of the presence of mobile thrombus.  Continue transition to Lovenox subcu at discharge.  Once his liver enzymes improve, Eliquis would be an option as an outpatient.   Patient was accepted to [facility] service for direct admission. See below for details  Subjective: Patient was seen and examined this morning.  Pleasant elderly African-American male.  Lying down in bed.  Not in distress.  Remains on heparin drip.  No active bleeding.  Labs stable. Multiple family members at  bedside  Assessment and plan: Extensive bilateral lower extremity DVT -Primarily presented with leg pain left more than right. -Ultrasound duplex showed extensive bilateral DVT extending up to the common femoral vein.  Thrombus in the right common femoral vein appears to be mobile. -Because of the severity of the DVT and the presence of a mobile clot, hematologist Dr. Lorenso Courier recommended inpatient hospitalization for IV heparin drip. Tentative plan to transition to Lovenox subcu at discharge.  Once his liver enzymes improve, Eliquis would be an option as an outpatient. -Dr. Lorenso Courier also recommended IVC filter because of the presence of mobile thrombus.  -This morning I spoke with radiologist Dr. Maryelizabeth Kaufmann.  We had a long conversation about the indications, risk and benefit of IVC filter.  At this time, patient is tolerating IV heparin without any evidence of bleeding.  We will continue heparin drip for next 48 hours.  If he continues to tolerate anticoagulation, he may not need IVC filter.  Otherwise, he will be reevaluated by IR on Monday for IVC filter.     Elevated liver enzymes -Liver enzymes as well as bilirubin elevated compared to last LFT check from May.  Probably related to hepatocellular cancer -Per hematologist, patient cannot have NOAC before improvement in LFTs. -Improving labs gradually. -Continue to monitor Recent Labs  Lab 10/11/21 1130 10/12/21 0352  AST 143* 135*  ALT 265* 242*  ALKPHOS 160* 119  BILITOT 1.3* 1.2  PROT 9.1* 8.1  ALBUMIN 3.7 3.0*   Essential hypertension -PTA on amlodipine, losartan. -Continue both.  As needed IV hydralazine.  Continue  to monitor blood pressure.   Constipation -Last BM 2 days ago.  Starting scheduled Senokot and as needed MiraLAX.   Goals of care   Code Status: Full Code    Mobility: Encourage ambulation.  PT eval ordered.  Skin assessment:     Nutritional status:  Body mass index is 23.38 kg/m.          Diet:  Diet  Order             Diet NPO time specified Except for: Sips with Meds  Diet effective ____           Diet regular Room service appropriate? Yes; Fluid consistency: Thin  Diet effective now                   DVT prophylaxis: IV heparin drip   Antimicrobials: None Fluid: Can stop IV fluid.  Encourage oral hydration. Consultants: Hematology, IR Family Communication: Multiple family members at bedside  Status is: Inpatient  Continue in-hospital care because: Remains on IV heparin drip Level of care: Telemetry   Dispo: The patient is from: Home              Anticipated d/c is to: Hopefully home, pending clinical course              Patient currently is not medically stable to d/c.   Difficult to place patient No     Infusions:   heparin 1,150 Units/hr (10/12/21 1024)    Scheduled Meds:  amLODipine  10 mg Oral Daily   feeding supplement  237 mL Oral BID BM   losartan  100 mg Oral Daily   senna  1 tablet Oral BID    PRN meds: acetaminophen **OR** acetaminophen, albuterol, hydrALAZINE, HYDROmorphone (DILAUDID) injection, oxyCODONE, polyethylene glycol   Antimicrobials: Anti-infectives (From admission, onward)    None       Objective: Vitals:   10/12/21 0058 10/12/21 0502  BP: (!) 143/96 130/81  Pulse: 89 96  Resp: 16 16  Temp: 97.6 F (36.4 C) 98.6 F (37 C)  SpO2: 97% 97%    Intake/Output Summary (Last 24 hours) at 10/12/2021 1050 Last data filed at 10/12/2021 0449 Gross per 24 hour  Intake 1445.23 ml  Output 250 ml  Net 1195.23 ml   Filed Weights   10/11/21 1701  Weight: 78.2 kg   Weight change:  Body mass index is 23.38 kg/m.   Physical Exam: General exam: Pleasant, elderly African-American male.  Not in physical distress Skin: No rashes, lesions or ulcers. HEENT: Atraumatic, normocephalic, no obvious bleeding Lungs: Clear to auscultation bilaterally CVS: Regular rate and rhythm, no murmur GI/Abd soft, nontender, nondistended, bowel  sound present CNS: Alert, awake, oriented x3 Psychiatry: Mood appropriate Extremities: No pedal edema, improving bilateral calf tenderness  Data Review: I have personally reviewed the laboratory data and studies available.  F/u labs ordered Unresulted Labs (From admission, onward)     Start     Ordered   10/13/21 0500  Heparin level (unfractionated)  Daily at 5am,   R      10/11/21 1903   10/12/21 1200  Heparin level (unfractionated)  Once-Timed,   TIMED        10/12/21 0502   10/12/21 0500  CBC with Differential/Platelet  Daily at 5am,   R      10/11/21 1658   10/12/21 0500  Comprehensive metabolic panel  Daily at 5am,   R      10/11/21 1658  10/12/21 0500  CBC  Daily at 5am,   R      10/11/21 1738            Signed, Terrilee Croak, MD [facility] 10/12/2021

## 2021-10-12 NOTE — Progress Notes (Signed)
ANTICOAGULATION CONSULT NOTE - follow up  Pharmacy Consult for Heparin Indication: DVT  No Known Allergies  Patient Measurements: Height: 6' (182.9 cm) Weight: 78.2 kg (172 lb 6.4 oz) IBW/kg (Calculated) : 77.6 Heparin Dosing Weight: actual   Vital Signs: Temp: 98.6 F (37 C) (07/15 0502) Temp Source: Oral (07/15 0502) BP: 130/81 (07/15 0502) Pulse Rate: 96 (07/15 0502)  Labs: Recent Labs    10/11/21 1130 10/11/21 1846 10/12/21 0347 10/12/21 0352 10/12/21 1208  HGB 14.4  --   --  13.1  --   HCT 43.5  --   --  41.0  --   PLT 214  --   --  226  --   APTT  --  28  --   --   --   LABPROT  --  14.0  --  15.7*  --   INR  --  1.1  --  1.3*  --   HEPARINUNFRC  --   --  >1.10*  --  1.04*  CREATININE 0.89  --   --  0.92  --      Estimated Creatinine Clearance: 82 mL/min (by C-G formula based on SCr of 0.92 mg/dL).   Medical History: Past Medical History:  Diagnosis Date   Allergy    Cancer ([place]) 02/2021   liver   Headache    Hyperlipidemia     Medications:  Scheduled:   amLODipine  10 mg Oral Daily   feeding supplement  237 mL Oral BID BM   losartan  100 mg Oral Daily   senna  1 tablet Oral BID   Infusions:   heparin 1,150 Units/hr (10/12/21 1024)   PRN: acetaminophen **OR** acetaminophen, albuterol, hydrALAZINE, HYDROmorphone (DILAUDID) injection, oxyCODONE, polyethylene glycol  Assessment: 71 yo male with hepatocellular carcinoma on oral chemotherapy admitted with extensive bilateral lower extremity DVT extending up to the common femoral vein; thrombus in the right common femoral vein appears to be mobile.  Pharmacy consulted to dose IV heparin.  Plan is for IVC filter placement and transition to Lovenox.  Eventual plan for Eliquis when LFTs improve.  Baseline aPTT, PT/INR WNL CBC: WNL SCr 0.89, Crcl ~85 ml/min No bleeding currently reported No anticoagulants/antiplatelets PTA  10/12/2021 HL 1.04 supra-therapeutic on 1150 units/hr No line or bleeding  issues noted per RN  CBC WNL INR 1.3  Goal of Therapy:  Heparin level 0.3-0.7 units/ml Monitor platelets by anticoagulation protocol: Yes   Plan:  Decrease heparin drip to 1000 units/hr Check heparin level 8 hours after rate change  Daily CBC    Royetta Asal, PharmD, BCPS 10/12/2021 1:33 PM

## 2021-10-12 NOTE — Progress Notes (Signed)
ANTICOAGULATION CONSULT NOTE - follow up  Pharmacy Consult for Heparin Indication: DVT  No Known Allergies  Patient Measurements: Height: 6' (182.9 cm) Weight: 78.2 kg (172 lb 6.4 oz) IBW/kg (Calculated) : 77.6 Heparin Dosing Weight: actual   Vital Signs: Temp: 97.6 F (36.4 C) (07/15 0058) Temp Source: Oral (07/15 0058) BP: 143/96 (07/15 0058) Pulse Rate: 89 (07/15 0058)  Labs: Recent Labs    10/11/21 1130 10/11/21 1846 10/12/21 0347 10/12/21 0352  HGB 14.4  --   --  13.1  HCT 43.5  --   --  41.0  PLT 214  --   --  226  APTT  --  28  --   --   LABPROT  --  14.0  --  15.7*  INR  --  1.1  --  1.3*  HEPARINUNFRC  --   --  >1.10*  --   CREATININE 0.89  --   --   --      Estimated Creatinine Clearance: 84.8 mL/min (by C-G formula based on SCr of 0.89 mg/dL).   Medical History: Past Medical History:  Diagnosis Date   Allergy    Cancer ([place]) 02/2021   liver   Headache    Hyperlipidemia     Medications:  Scheduled:   amLODipine  10 mg Oral Daily   feeding supplement  237 mL Oral BID BM   losartan  100 mg Oral Daily   senna  1 tablet Oral BID   Infusions:   sodium chloride 75 mL/hr at 10/12/21 0449   heparin 1,350 Units/hr (10/12/21 0449)   PRN: acetaminophen **OR** acetaminophen, albuterol, hydrALAZINE, HYDROmorphone (DILAUDID) injection, oxyCODONE, polyethylene glycol  Assessment: 71 yo male with hepatocellular carcinoma on oral chemotherapy admitted with extensive bilateral lower extremity DVT extending up to the common femoral vein; thrombus in the right common femoral vein appears to be mobile.  Pharmacy consulted to dose IV heparin.  Plan is for IVC filter placement and transition to Lovenox.  Eventual plan for Eliquis when LFTs improve.  Baseline aPTT, PT/INR WNL CBC: WNL SCr 0.89, Crcl ~85 ml/min No bleeding currently reported No anticoagulants/antiplatelets PTA  10/12/2021 HL>1.10 supra-therapeutic on 1350 units/hr Per RN level drawn opposite  arm of where heparin is infusion, no bleeding noted CBC WNL INR 1.3  Goal of Therapy:  Heparin level 0.3-0.7 units/ml Monitor platelets by anticoagulation protocol: Yes   Plan:  Hold heparin x 1 hour then Resume heparin drip at 1150 units/hr Heparin level in 6 hours Daily CBC  Dolly Rias RPh 10/12/2021, 5:01 AM

## 2021-10-12 NOTE — Progress Notes (Signed)
Request to IR for IVC filter placement in setting of mobile thrombus within the CFV. Patient currently tolerating heparin gtt per chart.  Case discussed with primary team by Dr. Maryelizabeth Kaufmann, indication for IVC filter placement even with mobile thrombus is weak however could be considered. Given patient tolerating heparin gtt at this time IR recommends contacting patient's primary oncologist to discuss IVC filter placement and if they desire placement IR attending on Monday will reassess patient for appropriateness.   Will restart diet today and make patient NPO at midnight on 7/17 in case decision is made to proceed with IVC filter placement.  Candiss Norse, PA-C

## 2021-10-12 NOTE — Plan of Care (Signed)

## 2021-10-13 DIAGNOSIS — I82413 Acute embolism and thrombosis of femoral vein, bilateral: Secondary | ICD-10-CM | POA: Diagnosis not present

## 2021-10-13 LAB — COMPREHENSIVE METABOLIC PANEL
ALT: 234 U/L — ABNORMAL HIGH (ref 0–44)
AST: 129 U/L — ABNORMAL HIGH (ref 15–41)
Albumin: 2.8 g/dL — ABNORMAL LOW (ref 3.5–5.0)
Alkaline Phosphatase: 111 U/L (ref 38–126)
Anion gap: 11 (ref 5–15)
BUN: 26 mg/dL — ABNORMAL HIGH (ref 8–23)
CO2: 21 mmol/L — ABNORMAL LOW (ref 22–32)
Calcium: 8.6 mg/dL — ABNORMAL LOW (ref 8.9–10.3)
Chloride: 102 mmol/L (ref 98–111)
Creatinine, Ser: 1.13 mg/dL (ref 0.61–1.24)
GFR, Estimated: >60 mL/min (ref >60)
Glucose, Bld: 53 mg/dL — ABNORMAL LOW (ref 70–99)
Potassium: 4.5 mmol/L (ref 3.5–5.1)
Sodium: 134 mmol/L — ABNORMAL LOW (ref 135–145)
Total Bilirubin: 1.5 mg/dL — ABNORMAL HIGH (ref 0.3–1.2)
Total Protein: 7.4 g/dL (ref 6.5–8.1)

## 2021-10-13 LAB — CBC WITH DIFFERENTIAL/PLATELET
Abs Immature Granulocytes: 0.02 10*3/uL (ref 0.00–0.07)
Basophils Absolute: 0 10*3/uL (ref 0.0–0.1)
Basophils Relative: 0 %
Eosinophils Absolute: 0.1 10*3/uL (ref 0.0–0.5)
Eosinophils Relative: 3 %
HCT: 38.7 % — ABNORMAL LOW (ref 39.0–52.0)
Hemoglobin: 12.4 g/dL — ABNORMAL LOW (ref 13.0–17.0)
Immature Granulocytes: 0 %
Lymphocytes Relative: 18 %
Lymphs Abs: 0.9 10*3/uL (ref 0.7–4.0)
MCH: 28.7 pg (ref 26.0–34.0)
MCHC: 32 g/dL (ref 30.0–36.0)
MCV: 89.6 fL (ref 80.0–100.0)
Monocytes Absolute: 0.5 10*3/uL (ref 0.1–1.0)
Monocytes Relative: 10 %
Neutro Abs: 3.5 10*3/uL (ref 1.7–7.7)
Neutrophils Relative %: 69 %
Platelets: 218 10*3/uL (ref 150–400)
RBC: 4.32 MIL/uL (ref 4.22–5.81)
RDW: 15.4 % (ref 11.5–15.5)
WBC: 5.1 10*3/uL (ref 4.0–10.5)
nRBC: 0 % (ref 0.0–0.2)

## 2021-10-13 LAB — HEPARIN LEVEL (UNFRACTIONATED)
Heparin Unfractionated: 0.69 IU/mL (ref 0.30–0.70)
Heparin Unfractionated: 0.42 IU/mL (ref 0.30–0.70)

## 2021-10-13 MED ORDER — BISACODYL 10 MG RE SUPP
10.0000 mg | Freq: Once | RECTAL | Status: AC
  Administered 2021-10-13: 10 mg via RECTAL
  Filled 2021-10-13: qty 1

## 2021-10-13 NOTE — Progress Notes (Signed)
PT Cancellation Note  Patient Details Name: TORRIS HOUSE MRN: [id number] DOB: 10-15-1950   Cancelled Treatment:     PT order received but eval deferred.  Per RN, pt with mobile blood clot and to have IVC filter placed in am.  Will follow.   Greidys Deland 10/13/2021, 3:50 PM

## 2021-10-13 NOTE — Plan of Care (Signed)

## 2021-10-13 NOTE — Progress Notes (Signed)
Pharmacy Brief Note - Evening Anticoagulation Follow Up:  Patient is a 13 yoM on heparin infusion for extensive DVT. For full history, see note by Royetta Asal, PharmD from earlier today.   Assessment: HL = 0.42 is therapeutic on heparin infusion of 800 units/hr Confirmed with RN that heparin infusing at correct rate. No signs of bleeding.   Goal: HL 0.3 - 0.7  Plan: Continue heparin infusion at current rate of 800 units/hr Check confirmatory 8 hour HL HL, CBC daily Monitor for signs of bleeding  Lenis Noon, PharmD 10/13/21 5:58 PM

## 2021-10-13 NOTE — Progress Notes (Signed)
PROGRESS NOTE  Franklin Rivera  DOB: 11/05/1950  PCP: [name], MD WPY:[id number]  DOA: 10/11/2021  LOS: 1 day  Hospital Day: 3  Brief narrative: Franklin Rivera is a 71 y.o. male with PMH significant for hepatocellular carcinoma on oral chemotherapy, patient of Dr. Irene Limbo. Patient presented to the cancer center today with left leg pain for 7 to 10 days.  Pain is sharp in left,, worse with ambulation, 10 out of 10 in severity, radiates up to his buttock and down to his foot.  No recent history of injury, long travel.  No previous history of blood clots.   He follows up at the pain clinic and was prescribed hydrocodone with not much symptom improvement.  Over the last few weeks, patient also has poor oral intake, poor hydration, constipation, abdominal pain.  He also stopped taking it medicines. No fever, chills, headache, visual changes, chest pain, shortness of breath, urinary symptoms, diarrhea, vomiting. At the cancer center, he underwent Ultrasound duplex showed extensive bilateral DVT extending up to the common femoral vein.  Thrombus in the right common femoral vein appears to be mobile.   LEFT: - Findings consistent with acute deep vein thrombosis involving the left popliteal vein, left posterior tibial veins, and left peroneal veins. - No cystic structure found in the popliteal fossa.   Covering hematologist Dr. Lorenso Courier recommended inpatient hospitalization for IV heparin drip.  Also recommend IVC filter because of the presence of mobile thrombus.  Continue transition to Lovenox subcu at discharge.  Once his liver enzymes improve, Eliquis would be an option as an outpatient.   Patient was accepted to [facility] service for direct admission. See below for details  Subjective: Patient was seen and examined this morning.   Pleasant elderly African-American male.  Lying down in bed.  Not in distress.   Remains on heparin drip.  No active bleeding.  Labs stable. Multiple family members  at bedside  Assessment and plan: Extensive bilateral lower extremity DVT -Primarily presented with leg pain left more than right. -Ultrasound duplex showed extensive bilateral DVT extending up to the common femoral vein.  Thrombus in the right common femoral vein appears to be mobile. -Because of the severity of the DVT and the presence of a mobile clot, hematologist Dr. Lorenso Courier recommended inpatient hospitalization for IV heparin drip. Tentative plan to transition to Lovenox subcu at discharge.  Once his liver enzymes improve, Eliquis would be an option as an outpatient. -Dr. Lorenso Courier also recommended IVC filter because of the presence of mobile thrombus.  -7/15, I spoke with radiologist Dr. Maryelizabeth Kaufmann.  We had a long conversation about the indications, risk and benefit of IVC filter.  At this time, patient is tolerating IV heparin without any evidence of bleeding.  We will continue heparin drip for next 48 hours.  If he continues to tolerate anticoagulation, he may not need IVC filter.  Otherwise, he will be reevaluated by IR on Monday for IVC filter.     Elevated liver enzymes -Liver enzymes as well as bilirubin elevated compared to last LFT check from May.  Probably related to hepatocellular cancer -Per hematologist, patient cannot have NOAC before improvement in LFTs. -Improving labs gradually. -Continue to monitor Recent Labs  Lab 10/11/21 1130 10/12/21 0352 10/13/21 0443  AST 143* 135* 129*  ALT 265* 242* 234*  ALKPHOS 160* 119 111  BILITOT 1.3* 1.2 1.5*  PROT 9.1* 8.1 7.4  ALBUMIN 3.7 3.0* 2.8*    Essential hypertension -PTA on amlodipine,  losartan. -Continue both.  As needed IV hydralazine.  Continue to monitor blood pressure.   Constipation -Last BM 4 days ago.  Continue scheduled Senokot and as needed MiraLAX.  Give 1 dose of Dulcolax today.  Goals of care   Code Status: Full Code    Mobility: Encourage ambulation.  PT eval ordered.  Skin assessment:     Nutritional  status:  Body mass index is 23.38 kg/m.          Diet:  Diet Order             Diet NPO time specified Except for: Sips with Meds  Diet effective ____           Diet regular Room service appropriate? Yes; Fluid consistency: Thin  Diet effective now                   DVT prophylaxis: IV heparin drip   Antimicrobials: None Fluid: Not on IV fluid  consultants: Hematology, IR Family Communication: Multiple family members at bedside  Status is: Inpatient  Continue in-hospital care because: Remains on IV heparin drip Level of care: Telemetry   Dispo: The patient is from: Home              Anticipated d/c is to: Hopefully home, pending clinical course              Patient currently is not medically stable to d/c.   Difficult to place patient No     Infusions:   heparin 800 Units/hr (10/13/21 1029)    Scheduled Meds:  amLODipine  10 mg Oral Daily   feeding supplement  237 mL Oral BID BM   losartan  100 mg Oral Daily   senna  1 tablet Oral BID    PRN meds: acetaminophen **OR** acetaminophen, albuterol, alum & mag hydroxide-simeth, hydrALAZINE, HYDROmorphone (DILAUDID) injection, lip balm, oxyCODONE, polyethylene glycol   Antimicrobials: Anti-infectives (From admission, onward)    None       Objective: Vitals:   10/12/21 1951 10/13/21 0445  BP: 116/88 137/82  Pulse: 99 88  Resp: 18 18  Temp: 98.2 F (36.8 C) 97.9 F (36.6 C)  SpO2: 95% 96%    Intake/Output Summary (Last 24 hours) at 10/13/2021 1200 Last data filed at 10/13/2021 0700 Gross per 24 hour  Intake 240 ml  Output 300 ml  Net -60 ml    Filed Weights   10/11/21 1701  Weight: 78.2 kg   Weight change:  Body mass index is 23.38 kg/m.   Physical Exam: General exam: Pleasant, elderly African-American male.  Not in physical distress Skin: No rashes, lesions or ulcers. HEENT: Atraumatic, normocephalic, no obvious bleeding Lungs: Clear to auscultation bilaterally CVS: Regular rate  and rhythm, no murmur GI/Abd soft, nontender, nondistended, bowel sound present CNS: Alert, awake, oriented x3 Psychiatry: Mood appropriate Extremities: No pedal edema, improving bilateral calf tenderness  Data Review: I have personally reviewed the laboratory data and studies available.  F/u labs ordered Unresulted Labs (From admission, onward)     Start     Ordered   10/12/21 0500  CBC with Differential/Platelet  Daily at 5am,   R      10/11/21 1658   10/12/21 0500  Comprehensive metabolic panel  Daily at 5am,   R      10/11/21 1658   10/12/21 0500  CBC  Daily at 5am,   R      10/11/21 1738  Signed, Terrilee Croak, MD [facility] 10/13/2021

## 2021-10-13 NOTE — Progress Notes (Signed)
ANTICOAGULATION CONSULT NOTE - follow up  Pharmacy Consult for Heparin Indication: DVT  No Known Allergies  Patient Measurements: Height: 6' (182.9 cm) Weight: 78.2 kg (172 lb 6.4 oz) IBW/kg (Calculated) : 77.6 Heparin Dosing Weight: actual   Vital Signs: Temp: 97.6 F (36.4 C) (07/16 1308) Temp Source: Oral (07/16 1308) BP: 130/78 (07/16 1308) Pulse Rate: 96 (07/16 1308)  Labs: Recent Labs    10/11/21 1130 10/11/21 1846 10/12/21 0347 10/12/21 0352 10/12/21 1208 10/12/21 2235 10/13/21 0443 10/13/21 0805  HGB 14.4  --   --  13.1  --   --  12.4*  --   HCT 43.5  --   --  41.0  --   --  38.7*  --   PLT 214  --   --  226  --   --  218  --   APTT  --  28  --   --   --   --   --   --   LABPROT  --  14.0  --  15.7*  --   --   --   --   INR  --  1.1  --  1.3*  --   --   --   --   HEPARINUNFRC  --   --    < >  --  1.04* 0.89*  --  0.69  CREATININE 0.89  --   --  0.92  --   --  1.13  --    < > = values in this interval not displayed.     Estimated Creatinine Clearance: 66.8 mL/min (by C-G formula based on SCr of 1.13 mg/dL).   Medical History: Past Medical History:  Diagnosis Date   Allergy    Cancer ([place]) 02/2021   liver   Headache    Hyperlipidemia     Medications:  Scheduled:   amLODipine  10 mg Oral Daily   bisacodyl  10 mg Rectal Once   feeding supplement  237 mL Oral BID BM   losartan  100 mg Oral Daily   senna  1 tablet Oral BID   Infusions:   heparin 800 Units/hr (10/13/21 1029)   PRN: acetaminophen **OR** acetaminophen, albuterol, alum & mag hydroxide-simeth, hydrALAZINE, HYDROmorphone (DILAUDID) injection, lip balm, oxyCODONE, polyethylene glycol  Assessment: 71 yo male with hepatocellular carcinoma on oral chemotherapy admitted with extensive bilateral lower extremity DVT extending up to the common femoral vein; thrombus in the right common femoral vein appears to be mobile.  Pharmacy consulted to dose IV heparin.  Plan is for IVC filter placement  and transition to Lovenox.  Eventual plan for Eliquis when LFTs improve.  Baseline aPTT, PT/INR WNL CBC: WNL HL 0.69, therapeutic but on borderline SCr 0.13, Crcl ~67 ml/min No bleeding or line issues per RN   No anticoagulants/antiplatelets PTA     Goal of Therapy:  Heparin level 0.3-0.7 units/ml Monitor platelets by anticoagulation protocol: Yes   Plan:  Decrease heparin drip to 800 units/hr Check heparin level 8 hours after rate change  Daily CBC   Royetta Asal, PharmD, BCPS 10/13/2021 1:39 PM

## 2021-10-14 ENCOUNTER — Telehealth (HOSPITAL_COMMUNITY): Payer: Self-pay

## 2021-10-14 ENCOUNTER — Other Ambulatory Visit (HOSPITAL_COMMUNITY): Payer: Self-pay

## 2021-10-14 DIAGNOSIS — I82413 Acute embolism and thrombosis of femoral vein, bilateral: Secondary | ICD-10-CM | POA: Diagnosis not present

## 2021-10-14 LAB — COMPREHENSIVE METABOLIC PANEL
ALT: 243 U/L — ABNORMAL HIGH (ref 0–44)
AST: 127 U/L — ABNORMAL HIGH (ref 15–41)
Albumin: 2.9 g/dL — ABNORMAL LOW (ref 3.5–5.0)
Alkaline Phosphatase: 112 U/L (ref 38–126)
Anion gap: 10 (ref 5–15)
BUN: 32 mg/dL — ABNORMAL HIGH (ref 8–23)
CO2: 24 mmol/L (ref 22–32)
Calcium: 8.9 mg/dL (ref 8.9–10.3)
Chloride: 103 mmol/L (ref 98–111)
Creatinine, Ser: 1.3 mg/dL — ABNORMAL HIGH (ref 0.61–1.24)
GFR, Estimated: 59 mL/min — ABNORMAL LOW (ref >60)
Glucose, Bld: 80 mg/dL (ref 70–99)
Potassium: 4.6 mmol/L (ref 3.5–5.1)
Sodium: 137 mmol/L (ref 135–145)
Total Bilirubin: 1.8 mg/dL — ABNORMAL HIGH (ref 0.3–1.2)
Total Protein: 7.8 g/dL (ref 6.5–8.1)

## 2021-10-14 LAB — CBC WITH DIFFERENTIAL/PLATELET
Abs Immature Granulocytes: 0.01 10*3/uL (ref 0.00–0.07)
Basophils Absolute: 0 10*3/uL (ref 0.0–0.1)
Basophils Relative: 0 %
Eosinophils Absolute: 0.1 10*3/uL (ref 0.0–0.5)
Eosinophils Relative: 2 %
HCT: 38.1 % — ABNORMAL LOW (ref 39.0–52.0)
Hemoglobin: 12.3 g/dL — ABNORMAL LOW (ref 13.0–17.0)
Immature Granulocytes: 0 %
Lymphocytes Relative: 17 %
Lymphs Abs: 0.9 10*3/uL (ref 0.7–4.0)
MCH: 28.7 pg (ref 26.0–34.0)
MCHC: 32.3 g/dL (ref 30.0–36.0)
MCV: 88.8 fL (ref 80.0–100.0)
Monocytes Absolute: 0.5 10*3/uL (ref 0.1–1.0)
Monocytes Relative: 9 %
Neutro Abs: 3.8 10*3/uL (ref 1.7–7.7)
Neutrophils Relative %: 72 %
Platelets: 235 10*3/uL (ref 150–400)
RBC: 4.29 MIL/uL (ref 4.22–5.81)
RDW: 15.5 % (ref 11.5–15.5)
WBC: 5.4 10*3/uL (ref 4.0–10.5)
nRBC: 0 % (ref 0.0–0.2)

## 2021-10-14 LAB — HEPARIN LEVEL (UNFRACTIONATED): Heparin Unfractionated: 0.41 IU/mL (ref 0.30–0.70)

## 2021-10-14 MED ORDER — SENNA 8.6 MG PO TABS: 1.0000 | ORAL_TABLET | Freq: Two times a day (BID) | ORAL | 0 refills | Status: AC

## 2021-10-14 MED ORDER — ENOXAPARIN SODIUM 80 MG/0.8ML IJ SOSY: 1.0000 mg/kg | PREFILLED_SYRINGE | Freq: Two times a day (BID) | INTRAMUSCULAR | 2 refills | Status: DC

## 2021-10-14 MED ORDER — ADULT MULTIVITAMIN W/MINERALS CH
1.0000 | ORAL_TABLET | Freq: Every day | ORAL | Status: DC
  Administered 2021-10-14 – 2021-10-15 (×2): 1 via ORAL
  Filled 2021-10-14 (×2): qty 1

## 2021-10-14 MED ORDER — MIRTAZAPINE 15 MG PO TABS
7.5000 mg | ORAL_TABLET | Freq: Every day | ORAL | Status: DC
  Administered 2021-10-14: 7.5 mg via ORAL
  Filled 2021-10-14: qty 1

## 2021-10-14 MED ORDER — SODIUM CHLORIDE 0.9 % IV SOLN: INTRAVENOUS | Status: DC

## 2021-10-14 MED ORDER — ENOXAPARIN SODIUM 80 MG/0.8ML IJ SOSY
1.0000 mg/kg | PREFILLED_SYRINGE | Freq: Two times a day (BID) | INTRAMUSCULAR | Status: DC
Start: 2021-10-14 — End: 2021-10-15
  Administered 2021-10-14 – 2021-10-15 (×2): 77.5 mg via SUBCUTANEOUS
  Filled 2021-10-14 (×2): qty 0.8

## 2021-10-14 NOTE — Progress Notes (Signed)
Chaplain engaged in an initial visit with Jenny Reichmann.  Chaplain introduced the Scientist, physiological, Healthcare POA document.  Jaze voiced that he wanted to wait on completing the document.  Chaplain offered understanding and support.  Chaplain let Wyeth know to contact a Chaplain via the nurse if he has any other questions.    10/14/21 1600  Clinical Encounter Type  Visited With Patient  Visit Type Initial;Social support  Spiritual Encounters  Spiritual Needs Literature;Brochure

## 2021-10-14 NOTE — TOC Benefit Eligibility Note (Signed)
Patient Research scientist (life sciences) completed.     The patient is currently admitted and upon discharge could be taking Lovenox 80 mg.   The current 30 day co-pay is, $0.   The patient is insured through [employer].

## 2021-10-14 NOTE — Progress Notes (Signed)
Initial Nutrition Assessment  DOCUMENTATION CODES:   Non-severe (moderate) malnutrition in context of chronic illness  INTERVENTION:  - continue Ensure Plus High Protein BID, each supplement provides 350 kcal and 20 grams of protein.  - will order 1 tablet multivitamin with minerals/day.  - check serum vitamin D on 7/18.  - will place High Calorie, High Protein handout in the AVS.   NUTRITION DIAGNOSIS:   Moderate Malnutrition related to chronic illness, cancer and cancer related treatments as evidenced by moderate fat depletion, moderate muscle depletion.  GOAL:   Patient will meet greater than or equal to 90% of their needs  MONITOR:   PO intake, Supplement acceptance, Labs, Weight trends  REASON FOR ASSESSMENT:   Malnutrition Screening Tool, Consult Assessment of nutrition requirement/status  ASSESSMENT:   71 y.o. male with medical history of hepatocellular carcinoma on oral chemotherapy and HLD. He presented to the [facility] due to LLE pain x7-10 days that was radiating up his buttocks and down to his foot. In the several weeks prior he had poor oral intake, poor hydration, constipation, and abdominal pain. He had stopped taking his medications at an unknown time. At the [facility] he underwent ultrasound which showed extensive bilateral DVTs extending up the common femoral vein and thrombus in the R common femoral vein appeared to be mobile.  Patient laying in bed with no visitors present at the time of RD visit. Patient shares that he abruptly lost his appetite ~1 month ago and that during that time he was mainly only consuming water. He shares that appetite returned yesterday AM and is now continuing to improve. He shares that he choked on a piece of a [employer] sandwich today d/t being excited appetite was back and eating too big of bites too quickly.  Bottle of Ensure on bedside table. Patient shares that he intermittently drinks supplement at home.  He recounts  difficulty with ambulating d/t LLE pain and shares that this is greatly improving. He shares that he was having abdominal pain and this is now improving too.   No chewing or swallowing difficulties at baseline. No taste changes.  Patient shares that he was taking vitamin B12 for a short time somewhat recently but no other vitamin or mineral supplements at home. Discussed checking vitamin D and rationale for this. Patient very agreeable.  Weight on 7/14 was 172 lb and weight on 6/16 was 184 lb. This indicates 12 lb weight loss (6.5% body weight) in the past 1 month; significant for time frame.    Labs reviewed; BUN: 32 mg/dl, creatinine: 1.3 mg/dl, LFTs elevated, GFR: 59 ml/min. Medications reviewed; 1 tablet senokot BID. IVF; NS @ 75 ml/hr.     NUTRITION - FOCUSED PHYSICAL EXAM:  Flowsheet Row Most Recent Value  Orbital Region Mild depletion  Upper Arm Region Moderate depletion  Thoracic and Lumbar Region Unable to assess  Buccal Region Moderate depletion  Temple Region Moderate depletion  Clavicle Bone Region Moderate depletion  Clavicle and Acromion Bone Region Moderate depletion  Scapular Bone Region Mild depletion  Dorsal Hand Mild depletion  Patellar Region Mild depletion  Anterior Thigh Region Mild depletion  Posterior Calf Region Moderate depletion  Edema (RD Assessment) None  Hair Reviewed  Eyes Reviewed  Mouth Reviewed  [missing a few teeth]  Skin Reviewed  Nails Reviewed       Diet Order:   Diet Order             Diet regular Room service appropriate? Yes;  Fluid consistency: Thin  Diet effective now           Diet general                   EDUCATION NEEDS:   Education needs have been addressed  Skin:  Skin Assessment: Reviewed RN Assessment  Last BM:  7/16 (type 2, large amount)  Height:   Ht Readings from Last 1 Encounters:  10/11/21 6' (1.829 m)    Weight:   Wt Readings from Last 1 Encounters:  10/11/21 78.2 kg     BMI:  Body mass  index is 23.38 kg/m.  Estimated Nutritional Needs:  Kcal:  2100-2300 kcal Protein:  105-120 grams Fluid:  >/= 2.3 L/day     Jarome Matin, MS, RD, LDN, CNSC Registered Dietitian II Inpatient Clinical Nutrition RD pager # and on-call/weekend pager # available in [facility]

## 2021-10-14 NOTE — Progress Notes (Addendum)
PROGRESS NOTE  Franklin Rivera  DOB: 1950/05/09  PCP: [name], MD NGE:[id number]  DOA: 10/11/2021  LOS: 2 days  Hospital Day: 4  Brief narrative: Franklin Rivera is a 71 y.o. male with PMH significant for hepatocellular carcinoma on oral chemotherapy, patient of Dr. Irene Limbo. Patient presented to the cancer center today with left leg pain for 7 to 10 days.  Pain is sharp in left,, worse with ambulation, 10 out of 10 in severity, radiates up to his buttock and down to his foot.  No recent history of injury, long travel.  No previous history of blood clots.   He follows up at the pain clinic and was prescribed hydrocodone with not much symptom improvement.  Over the last few weeks, patient also has poor oral intake, poor hydration, constipation, abdominal pain.  He also stopped taking it medicines. No fever, chills, headache, visual changes, chest pain, shortness of breath, urinary symptoms, diarrhea, vomiting. At the cancer center, he underwent Ultrasound duplex showed extensive bilateral DVT extending up to the common femoral vein.  Thrombus in the right common femoral vein appears to be mobile.   LEFT: - Findings consistent with acute deep vein thrombosis involving the left popliteal vein, left posterior tibial veins, and left peroneal veins. - No cystic structure found in the popliteal fossa.   Covering hematologist Dr. Lorenso Courier recommended inpatient hospitalization for IV heparin drip.  Also recommend IVC filter because of the presence of mobile thrombus.  Continue transition to Lovenox subcu at discharge.  Once his liver enzymes improve, Eliquis would be an option as an outpatient.   Patient was accepted to [facility] service for direct admission. See below for details  Subjective: Patient was seen and examined this morning.   Lying on bed.  Not in distress.  No new symptoms.  He has been on heparin drip for last several hours with stable hemoglobin.  Creatinine slightly up today. Seen by  IR.  Noted a full replacement. Pending evaluation by PT.  Assessment and plan: Extensive bilateral lower extremity DVT -Primarily presented with leg pain left more than right. -Ultrasound duplex showed extensive bilateral DVT extending up to the common femoral vein.  Thrombus in the right common femoral vein appears to be mobile. -Because of the severity of the DVT and the presence of a mobile clot, hematologist Dr. Lorenso Courier recommended inpatient hospitalization for IV heparin drip.  Patient has tolerated heparin drip for last 72 hours without drop in hemoglobin. -IR consultation was obtained.  No need of IVC filter. -I switched him from heparin drip to Lovenox subcu today. -As recommended by oncologist Dr. Lorenso Courier on 7/14, patient is to be discharged on Lovenox subcu.  Once his liver enzymes improve, Eliquis would be an option as an outpatient.   Elevated liver enzymes -Liver enzymes as well as bilirubin elevated compared to last LFT check from May.  Probably related to hepatocellular cancer -Per hematologist, patient cannot have NOAC before improvement in LFTs. -Improving labs gradually. -Continue to monitor as an outpatient Recent Labs  Lab 10/11/21 1130 10/12/21 0352 10/13/21 0443 10/14/21 0058  AST 143* 135* 129* 127*  ALT 265* 242* 234* 243*  ALKPHOS 160* 119 111 112  BILITOT 1.3* 1.2 1.5* 1.8*  PROT 9.1* 8.1 7.4 7.8  ALBUMIN 3.7 3.0* 2.8* 2.9*    Essential hypertension -PTA on amlodipine, losartan. -Continue both.  As needed IV hydralazine.  Continue to monitor blood pressure.   Constipation -Last BM 4 days ago.  Daily bowel  movement as an outpatient  Impaired mobility -Pending PT eval  Goals of care   Code Status: Full Code    Mobility: Encourage ambulation.  PT eval ordered.  Skin assessment:     Nutritional status:  Body mass index is 23.38 kg/m.          Diet:  Diet Order             Diet regular Room service appropriate? Yes; Fluid consistency:  Thin  Diet effective now                   DVT prophylaxis: IV heparin drip   Antimicrobials: None Fluid: Not on IV fluid  consultants: Hematology, IR Family Communication: Wife at bedside  Status is: Inpatient  Continue in-hospital care because: Pending PT eval Level of care: Telemetry   Dispo: The patient is from: Home              Anticipated d/c is to: Pending PT eval              Patient currently is not medically stable to d/c.   Difficult to place patient No     Infusions:   sodium chloride 75 mL/hr at 10/14/21 0944   heparin 800 Units/hr (10/14/21 0944)    Scheduled Meds:  amLODipine  10 mg Oral Daily   enoxaparin (LOVENOX) injection  1 mg/kg Subcutaneous Q12H   feeding supplement  237 mL Oral BID BM   mirtazapine  7.5 mg Oral QHS   senna  1 tablet Oral BID    PRN meds: acetaminophen **OR** acetaminophen, albuterol, alum & mag hydroxide-simeth, hydrALAZINE, HYDROmorphone (DILAUDID) injection, lip balm, oxyCODONE, polyethylene glycol   Antimicrobials: Anti-infectives (From admission, onward)    None       Objective: Vitals:   10/14/21 0916 10/14/21 1301  BP: 109/73 (!) 152/87  Pulse:  (!) 101  Resp:  18  Temp:  98.8 F (37.1 C)  SpO2:  98%    Intake/Output Summary (Last 24 hours) at 10/14/2021 1623 Last data filed at 10/14/2021 0944 Gross per 24 hour  Intake 845.1 ml  Output 300 ml  Net 545.1 ml    Filed Weights   10/11/21 1701  Weight: 78.2 kg   Weight change:  Body mass index is 23.38 kg/m.   Physical Exam: General exam: Pleasant, elderly African-American male.  Not in physical distress Skin: No rashes, lesions or ulcers. HEENT: Atraumatic, normocephalic, no obvious bleeding Lungs: Clear to auscultation bilaterally CVS: Regular rate and rhythm, no murmur GI/Abd soft, nontender, nondistended, bowel sound present CNS: Alert, awake, oriented x3 Psychiatry: Mood appropriate Extremities: No pedal edema, improving bilateral  calf tenderness  Data Review: I have personally reviewed the laboratory data and studies available.  F/u labs ordered Unresulted Labs (From admission, onward)     Start     Ordered   10/12/21 0500  CBC  Daily at 5am,   R      10/11/21 1738            Signed, Terrilee Croak, MD [facility] 10/14/2021

## 2021-10-14 NOTE — Plan of Care (Signed)
  Problem: Education: Goal: Knowledge of General Education information will improve Description Including pain rating scale, medication(s)/side effects and non-pharmacologic comfort measures Outcome: Progressing   Problem: Health Behavior/Discharge Planning: Goal: Ability to manage health-related needs will improve Outcome: Progressing   

## 2021-10-14 NOTE — Telephone Encounter (Signed)
Pharmacy Patient Advocate Encounter  Insurance verification completed.    The patient is insured through [employer]   The patient is currently admitted and ran test claims for the following: Lovenox.  Copays and coinsurance results were relayed to Inpatient clinical team.

## 2021-10-14 NOTE — Progress Notes (Signed)
Pt educated on self-administration of Lovenox injection.  Pt verbalized and demonstrated understanding.  Spouse not present at time of injection.  Heparin gtt discontinued.

## 2021-10-14 NOTE — Plan of Care (Signed)
  Problem: Clinical Measurements: Goal: Diagnostic test results will improve Outcome: Progressing   Problem: Activity: Goal: Risk for activity intolerance will decrease Outcome: Progressing   Problem: Nutrition: Goal: Adequate nutrition will be maintained Outcome: Progressing   Problem: Coping: Goal: Level of anxiety will decrease Outcome: Progressing   

## 2021-10-14 NOTE — Progress Notes (Signed)
Request to IR for IVC filter placement in setting of mobile thrombus within the CFV. Patient currently tolerating heparin gtt per chart.   Case was initially discussed with primary team by Dr. Maryelizabeth Kaufmann, indication for IVC filter placement even with mobile thrombus is weak however could be considered. Given patient tolerating heparin gtt at this time IR recommends contacting patient's primary oncologist to discuss IVC filter placement and if they desire placement IR attending on Monday will reassess patient for appropriateness.   Pt has done well past 48 hrs. No signs of bleeding while on anticoagulation. No SOB or worsening leg pain/swelling.  Discussed with Dr. Dwaine Gale, who is in agreement that at this point, pt stable on anticoagulation and IVC filter not necessary.  Will resume regular diet. Transition to po anticoagulation per primary/Hem-Onc teams.  Ascencion Dike PA-C Interventional Radiology 10/14/2021 11:54 AM

## 2021-10-14 NOTE — Progress Notes (Signed)
MD order for PT to see pt prior to discharge.  Pt spouse notified.  Will continue to monitor.

## 2021-10-14 NOTE — Progress Notes (Addendum)
[place] for Lovenox Indication: DVT  No Known Allergies  Patient Measurements: Height: 6' (182.9 cm) Weight: 78.2 kg (172 lb 6.4 oz) IBW/kg (Calculated) : 77.6 Heparin Dosing Weight: actual   Vital Signs: Temp: 98.8 F (37.1 C) (07/17 1301) Temp Source: Oral (07/17 1301) BP: 152/87 (07/17 1301) Pulse Rate: 101 (07/17 1301)  Labs: Recent Labs    10/11/21 1846 10/12/21 0347 10/12/21 0352 10/12/21 1208 10/13/21 0443 10/13/21 0805 10/13/21 1759 10/14/21 0058  HGB  --    < > 13.1  --  12.4*  --   --  12.3*  HCT  --   --  41.0  --  38.7*  --   --  38.1*  PLT  --   --  226  --  218  --   --  235  APTT 28  --   --   --   --   --   --   --   LABPROT 14.0  --  15.7*  --   --   --   --   --   INR 1.1  --  1.3*  --   --   --   --   --   HEPARINUNFRC  --    < >  --    < >  --  0.69 0.42 0.41  CREATININE  --   --  0.92  --  1.13  --   --  1.30*   < > = values in this interval not displayed.     Estimated Creatinine Clearance: 58 mL/min (A) (by C-G formula based on SCr of 1.3 mg/dL (H)).   Medical History: Past Medical History:  Diagnosis Date   Allergy    Cancer ([place]) 02/2021   liver   Headache    Hyperlipidemia      Assessment: 71 yo male with hepatocellular carcinoma on oral chemotherapy admitted with extensive bilateral lower extremity DVT extending up to the common femoral vein; thrombus in the right common femoral vein appears to be mobile.  Pharmacy initially consulted to dose IV heparin. IVC filter no longer indicated per IR given response to anticoagulation; pharmacy consulted to transition to Lovenox. Eventual plan for Eliquis when LFTs improve.  CBC stable, no bleeding noted.    Plan:  Continue heparin drip at 800 units/hr until 1800 Start Lovenox 77.5 mg (1 mg/kg) BID at 1800 and turn off heparin drip at same time Plan discussed with RN Monitor CBC, signs/symptoms of bleeding   Tawnya Crook, PharmD,  BCPS Clinical Pharmacist 10/14/2021 3:07 PM

## 2021-10-14 NOTE — Progress Notes (Signed)
ANTICOAGULATION CONSULT NOTE - follow up  Pharmacy Consult for Heparin Indication: DVT  No Known Allergies  Patient Measurements: Height: 6' (182.9 cm) Weight: 78.2 kg (172 lb 6.4 oz) IBW/kg (Calculated) : 77.6 Heparin Dosing Weight: actual   Vital Signs: Temp: 97.5 F (36.4 C) (07/16 2009) BP: 155/85 (07/16 2009) Pulse Rate: 93 (07/16 2009)  Labs: Recent Labs    10/11/21 1846 10/12/21 0347 10/12/21 0352 10/12/21 1208 10/13/21 0443 10/13/21 0805 10/13/21 1759 10/14/21 0058  HGB  --    < > 13.1  --  12.4*  --   --  12.3*  HCT  --   --  41.0  --  38.7*  --   --  38.1*  PLT  --   --  226  --  218  --   --  235  APTT 28  --   --   --   --   --   --   --   LABPROT 14.0  --  15.7*  --   --   --   --   --   INR 1.1  --  1.3*  --   --   --   --   --   HEPARINUNFRC  --    < >  --    < >  --  0.69 0.42 0.41  CREATININE  --   --  0.92  --  1.13  --   --  1.30*   < > = values in this interval not displayed.     Estimated Creatinine Clearance: 58 mL/min (A) (by C-G formula based on SCr of 1.3 mg/dL (H)).   Medical History: Past Medical History:  Diagnosis Date   Allergy    Cancer ([place]) 02/2021   liver   Headache    Hyperlipidemia     Medications:  Scheduled:   amLODipine  10 mg Oral Daily   feeding supplement  237 mL Oral BID BM   losartan  100 mg Oral Daily   senna  1 tablet Oral BID   Infusions:   heparin 800 Units/hr (10/14/21 0131)   PRN: acetaminophen **OR** acetaminophen, albuterol, alum & mag hydroxide-simeth, hydrALAZINE, HYDROmorphone (DILAUDID) injection, lip balm, oxyCODONE, polyethylene glycol  Assessment: 71 yo male with hepatocellular carcinoma on oral chemotherapy admitted with extensive bilateral lower extremity DVT extending up to the common femoral vein; thrombus in the right common femoral vein appears to be mobile.  Pharmacy consulted to dose IV heparin.  Plan is for IVC filter placement and transition to Lovenox.  Eventual plan for Eliquis  when LFTs improve.  10/14/2021 HL 0.41 therapeutic on 800 units/hr CBC stable No bleeding noted   Goal of Therapy:  Heparin level 0.3-0.7 units/ml Monitor platelets by anticoagulation protocol: Yes   Plan:  Continue heparin drip at 800 units/hr Daily CBC and heparin level   Dolly Rias RPh 10/14/2021, 3:44 AM

## 2021-10-14 NOTE — Discharge Instructions (Signed)

## 2021-10-15 ENCOUNTER — Other Ambulatory Visit (HOSPITAL_COMMUNITY): Payer: Self-pay

## 2021-10-15 ENCOUNTER — Other Ambulatory Visit: Payer: Self-pay | Admitting: Student

## 2021-10-15 ENCOUNTER — Encounter: Payer: Self-pay | Admitting: Hematology

## 2021-10-15 DIAGNOSIS — I10 Essential (primary) hypertension: Secondary | ICD-10-CM

## 2021-10-15 DIAGNOSIS — N179 Acute kidney failure, unspecified: Secondary | ICD-10-CM

## 2021-10-15 DIAGNOSIS — D649 Anemia, unspecified: Secondary | ICD-10-CM

## 2021-10-15 DIAGNOSIS — I82413 Acute embolism and thrombosis of femoral vein, bilateral: Secondary | ICD-10-CM

## 2021-10-15 DIAGNOSIS — E440 Moderate protein-calorie malnutrition: Secondary | ICD-10-CM | POA: Diagnosis not present

## 2021-10-15 DIAGNOSIS — R748 Abnormal levels of other serum enzymes: Secondary | ICD-10-CM | POA: Diagnosis not present

## 2021-10-15 LAB — RENAL FUNCTION PANEL
Albumin: 2.5 g/dL — ABNORMAL LOW (ref 3.5–5.0)
Anion gap: 8 (ref 5–15)
BUN: 25 mg/dL — ABNORMAL HIGH (ref 8–23)
CO2: 23 mmol/L (ref 22–32)
Calcium: 8.6 mg/dL — ABNORMAL LOW (ref 8.9–10.3)
Chloride: 105 mmol/L (ref 98–111)
Creatinine, Ser: 0.96 mg/dL (ref 0.61–1.24)
GFR, Estimated: >60 mL/min (ref >60)
Glucose, Bld: 72 mg/dL (ref 70–99)
Phosphorus: 2.7 mg/dL (ref 2.5–4.6)
Potassium: 4.4 mmol/L (ref 3.5–5.1)
Sodium: 136 mmol/L (ref 135–145)

## 2021-10-15 LAB — VITAMIN D 25 HYDROXY (VIT D DEFICIENCY, FRACTURES): Vit D, 25-Hydroxy: 12.2 ng/mL — ABNORMAL LOW (ref 30–100)

## 2021-10-15 LAB — CBC
HCT: 35.2 % — ABNORMAL LOW (ref 39.0–52.0)
Hemoglobin: 11 g/dL — ABNORMAL LOW (ref 13.0–17.0)
MCH: 28.1 pg (ref 26.0–34.0)
MCHC: 31.3 g/dL (ref 30.0–36.0)
MCV: 90 fL (ref 80.0–100.0)
Platelets: 211 10*3/uL (ref 150–400)
RBC: 3.91 MIL/uL — ABNORMAL LOW (ref 4.22–5.81)
RDW: 15.4 % (ref 11.5–15.5)
WBC: 4.7 10*3/uL (ref 4.0–10.5)
nRBC: 0 % (ref 0.0–0.2)

## 2021-10-15 LAB — MAGNESIUM: Magnesium: 2 mg/dL (ref 1.7–2.4)

## 2021-10-15 MED ORDER — ENOXAPARIN SODIUM 80 MG/0.8ML IJ SOSY: 80.0000 mg | PREFILLED_SYRINGE | Freq: Two times a day (BID) | INTRAMUSCULAR | 2 refills | Status: AC

## 2021-10-15 MED ORDER — ENOXAPARIN SODIUM 80 MG/0.8ML IJ SOSY: 80.0000 mg | PREFILLED_SYRINGE | Freq: Two times a day (BID) | INTRAMUSCULAR | Status: DC

## 2021-10-15 MED ORDER — VITAMIN D (ERGOCALCIFEROL) 1.25 MG (50000 UNIT) PO CAPS: 50000.0000 [IU] | ORAL_CAPSULE | ORAL | 0 refills | Status: DC

## 2021-10-15 MED ORDER — ADULT MULTIVITAMIN W/MINERALS CH: 1.0000 | ORAL_TABLET | Freq: Every day | ORAL | Status: AC

## 2021-10-15 MED ORDER — ENOXAPARIN SODIUM 80 MG/0.8ML IJ SOSY: 80.0000 mg | PREFILLED_SYRINGE | Freq: Two times a day (BID) | INTRAMUSCULAR | 2 refills | Status: DC

## 2021-10-15 NOTE — Progress Notes (Signed)
  Transition of Care [facility]) Screening Note   Patient Details  Name: Franklin Rivera Date of Birth: April 25, 1950   Transition of Care [facility]) CM/SW Contact:    Vassie Moselle, LCSW Phone Number: 10/15/2021, 10:14 AM    Transition of Care Department [facility]) has reviewed patient and no TOC needs have been identified at this time. We will continue to monitor patient advancement through interdisciplinary progression rounds. If new patient transition needs arise, please place a TOC consult.

## 2021-10-15 NOTE — Care Management Important Message (Signed)
Important Message  Patient Details IM Letter given to the Patient. Name: Franklin Rivera MRN: [id number] Date of Birth: [birth year]   Medicare Important Message Given:  Yes     Kerin Salen 10/15/2021, 9:56 AM

## 2021-10-15 NOTE — Progress Notes (Addendum)
BRIEF PHARMACY MONITORING NOTE:  Franklin Rivera is a 71 yo male found to have bilateral DVT's and will be discharged on Lovenox syringes.   Plan: - Adjust Lovenox to '80mg'$  [place] q12 hours (~'1mg'$ /kg) for ease of administration - Inpatient and discharge orders updated to reflect new dosing  Dimple Nanas, PharmD, BCPS 10/15/2021 9:06 AM

## 2021-10-15 NOTE — Discharge Summary (Signed)
Physician Discharge Summary  Franklin Rivera YPP:[id number] DOB: 11/30/1950 DOA: 10/11/2021  PCP: [name], MD  Admit date: 10/11/2021 Discharge date: 10/15/2021 Admitted From: Home Disposition: Home Recommendations for Outpatient Follow-up:  Follow up with PCP in 1 week Check CBC and CMP in 1 week Reassess blood pressure and adjust antihypertensive meds as appropriate Please follow up on the following pending results: None  Home Health: HH PT Equipment/Devices: None  Discharge Condition: Stable CODE STATUS: Full code  Follow-up Information     [name], MD Follow up.   Specialty: Family Medicine Contact information: [place] [address] [phone]         Brunetta Genera, MD Follow up.   Specialties: Hematology, Oncology Contact information: [place] [address] [facility] course 71 year old M with PMH of hepatocellular carcinoma on chemo, HTN and constipation presenting with LLE pain for 7 to 10 days that did not improve with pain medication outpatient.  He presented to consider and had an ultrasound that showed extensive bilateral DVT extending up to the common femoral veins with mobile thrombus in right common femoral vein.  He was admitted per oncology recommendation for IV heparin and consideration for IVC filter.   Patient was started on IV heparin.  IR consulted and did not feel IVC is needed.  Eventually, he was transitioned to subcu Lovenox and discharged on Lovenox until follow-up with oncology outpatient.  Plan is to consider DOAC such as Eliquis once liver enzymes improve.   See individual problem list below for more.   Problems addressed during this hospitalization Principal Problem:   Acute bilateral deep vein thrombosis (DVT) of femoral veins (HCC) Active Problems:   Malnutrition of moderate degree   Extensive bilateral lower extremity DVT- -Discharged  on subcu Lovenox 80 mg twice daily per recommendation by oncology. -Plan for transition to [place] once patient's liver enzymes improves -Outpatient follow-up with oncology   Elevated liver enzymes: Improved.  Likely from underlying [place]. -Recheck at follow-up  AKI: Resolved -Discontinue losartan on discharge.  Essential hypertension: Normotensive. -Continue home amlodipine -Discontinue losartan due to risk for AKI. -Reassess blood pressure and adjust as appropriate   Constipation: Resolved. -Bowel regimen  Normocytic anemia: Relatively stable. -Check CBC at follow-up   Impaired mobility -HH PT per recommendation by therapy  Vitamin D deficiency: Vitamin D 12.2. -Discharged on vitamin D 50,000 units every 7 hours for 7 weeks  Moderate malnutrition Nutrition Problem: Moderate Malnutrition Etiology: chronic illness, cancer and cancer related treatments Signs/Symptoms: moderate fat depletion, moderate muscle depletion Interventions: Ensure Enlive (each supplement provides 350kcal and 20 grams of protein), Refer to RD note for recommendations     Vital signs Vitals:   10/14/21 0916 10/14/21 1301 10/14/21 2105 10/15/21 0407  BP: 109/73 (!) 152/87 116/82 121/90  Pulse:  (!) 101 78 84  Temp:  98.8 F (37.1 C) 97.9 F (36.6 C) 97.6 F (36.4 C)  Resp:  '18 17 20  '$ Height:      Weight:      SpO2:  98% 94% 98%  TempSrc:  Oral    BMI (Calculated):         Discharge exam  GENERAL: No apparent distress.  Nontoxic. HEENT: MMM.  Vision and hearing grossly intact.  NECK: Supple.  No apparent JVD.  RESP:  No IWOB.  Fair aeration bilaterally. CVS:  RRR. Heart  sounds normal.  ABD/GI/GU: BS+. Abd soft, NTND.  MSK/EXT:  Moves extremities. No apparent deformity. No edema.  SKIN: no apparent skin lesion or wound NEURO: Awake and alert. Oriented appropriately.  No apparent focal neuro deficit. PSYCH: Calm. Normal affect.   Discharge Instructions Discharge Instructions     Call MD  for:  difficulty breathing, headache or visual disturbances   Complete by: As directed    Call MD for:  extreme fatigue   Complete by: As directed    Call MD for:  hives   Complete by: As directed    Call MD for:  persistant dizziness or light-headedness   Complete by: As directed    Call MD for:  persistant nausea and vomiting   Complete by: As directed    Call MD for:  severe uncontrolled pain   Complete by: As directed    Call MD for:  temperature >100.4   Complete by: As directed    Diet general   Complete by: As directed    Discharge instructions   Complete by: As directed    It has been a pleasure taking care of you!  You were hospitalized due to blood clot in your legs for which you have been started on blood thinner (Lovenox injection).  We are discharging you on this blood thinner.  It is very important that you continue taking your medication as prescribed.  We strongly recommend avoiding over-the-counter pain medication while taking blood thinner.  Please follow-up with your primary care doctor and oncologist in 1 to 2 weeks or sooner if needed.   Take care,   Increase activity slowly   Complete by: As directed       Allergies as of 10/15/2021   No Known Allergies      Medication List     STOP taking these medications    losartan 100 MG tablet Commonly known as: COZAAR       TAKE these medications    amLODipine 10 MG tablet Commonly known as: NORVASC Take 10 mg by mouth daily.   cetirizine 10 MG tablet Commonly known as: ZYRTEC Take 1 tablet (10 mg total) by mouth daily.   cyclobenzaprine 5 MG tablet Commonly known as: FLEXERIL Take 1 tablet (5 mg total) by mouth 3 (three) times daily as needed for up to 7 days for muscle spasms.   enoxaparin 80 MG/0.8ML injection Commonly known as: LOVENOX Inject 0.8 mLs (80 mg total) into the skin every 12 (twelve) hours.   HYDROcodone-acetaminophen 10-325 MG tablet Commonly known as: NORCO Take 1 tablet  by mouth 3 (three) times daily.   hydrocortisone 2.5 % rectal cream Commonly known as: Anusol-HC Place 1 Application rectally 2 (two) times daily. What changed:  when to take this reasons to take this   Lenvima (12 MG Daily Dose) 3 x 4 MG capsule Generic drug: lenvatinib 12 mg daily dose Take 12 mg (3 capsules) by mouth daily.   multivitamin with minerals Tabs tablet Take 1 tablet by mouth daily. Start taking on: October 16, 2021   ondansetron 8 MG tablet Commonly known as: Zofran Take 1 tablet (8 mg total) by mouth 2 (two) times daily as needed (Nausea or vomiting). What changed: reasons to take this   PriLOSEC OTC 20 MG tablet Generic drug: omeprazole Take 20 mg by mouth See admin instructions. Take 20 mg by mouth in the morning before breakfast and an additional 20 mg at bedtime as needed for reflux   prochlorperazine 10  MG tablet Commonly known as: COMPAZINE Take 1 tablet (10 mg total) by mouth every 6 (six) hours as needed (Nausea or vomiting). What changed: reasons to take this   senna 8.6 MG Tabs tablet Commonly known as: SENOKOT Take 1 tablet (8.6 mg total) by mouth 2 (two) times daily.   Vitamin D (Ergocalciferol) 1.25 MG (50000 UNIT) Caps capsule Commonly known as: DRISDOL Take 1 capsule (50,000 Units total) by mouth every 7 (seven) days.        Consultations: Oncology Interventional radiology  Procedures/Studies:   VAS [place] LOWER EXTREMITY VENOUS (DVT)  Result Date: 10/12/2021  Lower Venous DVT Study Patient Name:  Franklin Rivera  Date of Exam:   10/11/2021 Medical Rec #: [id number]        Accession #:    [id number] Date of Birth: 1950-05-02        Patient Gender: M Patient Age:   71 years Exam Location:  [facility] Procedure:      VAS [place] LOWER EXTREMITY VENOUS (DVT) Referring Phys: Sherol Dade --------------------------------------------------------------------------------  Indications: Pain.  Comparison Study: No prior study Performing  Technologist: Maudry Mayhew MHA, RDMS, RVT, RDCS  Examination Guidelines: A complete evaluation includes B-mode imaging, spectral Doppler, color Doppler, and power Doppler as needed of all accessible portions of each vessel. Bilateral testing is considered an integral part of a complete examination. Limited examinations for reoccurring indications may be performed as noted. The reflux portion of the exam is performed with the patient in reverse Trendelenburg.  +---------+---------------+---------+-----------+----------+------------------+ RIGHT    CompressibilityPhasicitySpontaneityPropertiesThrombus Aging     +---------+---------------+---------+-----------+----------+------------------+ CFV                     Yes      Yes                  Mobile acute                                                             thrombus           +---------+---------------+---------+-----------+----------+------------------+ SFJ                              Yes                  Acute              +---------+---------------+---------+-----------+----------+------------------+ FV Prox                          No                   Acute              +---------+---------------+---------+-----------+----------+------------------+ FV Mid   None                                         Acute              +---------+---------------+---------+-----------+----------+------------------+ FV DistalNone  Acute              +---------+---------------+---------+-----------+----------+------------------+ PFV                              No                   Acute              +---------+---------------+---------+-----------+----------+------------------+ POP      Full           Yes      Yes                                     +---------+---------------+---------+-----------+----------+------------------+ PTV      Full                     Yes                                     +---------+---------------+---------+-----------+----------+------------------+ PERO     Full                    Yes                                     +---------+---------------+---------+-----------+----------+------------------+ EIV                     Yes      Yes                                     +---------+---------------+---------+-----------+----------+------------------+ CIV                     Yes      Yes                                     +---------+---------------+---------+-----------+----------+------------------+ Unable to visualize IVC secondary to overlying bowel gas. All compression maneuvers not performed secondary to mobile CFV thrombus.  +---------+---------------+---------+-----------+----------+--------------+ LEFT     CompressibilityPhasicitySpontaneityPropertiesThrombus Aging +---------+---------------+---------+-----------+----------+--------------+ CFV      Full           Yes      Yes                                 +---------+---------------+---------+-----------+----------+--------------+ SFJ      Full                                                        +---------+---------------+---------+-----------+----------+--------------+ FV Prox  Full                                                        +---------+---------------+---------+-----------+----------+--------------+  FV Mid   Full                                                        +---------+---------------+---------+-----------+----------+--------------+ FV DistalFull                                                        +---------+---------------+---------+-----------+----------+--------------+ PFV      Full                                                        +---------+---------------+---------+-----------+----------+--------------+ POP      None                    No                   Acute           +---------+---------------+---------+-----------+----------+--------------+ PTV      None                    No                   Acute          +---------+---------------+---------+-----------+----------+--------------+ PERO     None                    No                   Acute          +---------+---------------+---------+-----------+----------+--------------+ EIV                     Yes      Yes                                 +---------+---------------+---------+-----------+----------+--------------+ CIV                     Yes      Yes                                 +---------+---------------+---------+-----------+----------+--------------+     Summary: RIGHT: - Findings consistent with acute deep vein thrombosis involving the right common femoral vein, right femoral vein, and right proximal profunda vein. Thrombus in the right common femoral vein appears to be mobile. - No cystic structure found in the popliteal fossa.  LEFT: - Findings consistent with acute deep vein thrombosis involving the left popliteal vein, left posterior tibial veins, and left peroneal veins. - No cystic structure found in the popliteal fossa.  *See table(s) above for measurements and observations. Electronically signed by Harold Barban MD on 10/12/2021 at 12:50:40 AM.    Final    DG Thoracic Spine W/Swimmers  Result Date: 09/17/2021 CLINICAL DATA:  pain thoracic spine and some swelling. please place marker EXAM: THORACIC  SPINE - 3 VIEWS COMPARISON:  CT abdomen pelvis 05/24/2021 FINDINGS: Limited evaluation due to overlapping osseous structures and overlying soft tissues. There is no evidence of thoracic spine fracture. Alignment is normal. No other significant bone abnormalities are identified. Calcifications overlying the left kidney suggesting nephrolithiasis. Aortic calcification. IMPRESSION: 1. No acute displaced fracture or traumatic listhesis of the thoracic spine. 2.  Aortic Atherosclerosis  (ICD10-I70.0). Electronically Signed   By: Iven Finn M.D.   On: 09/17/2021 15:40       The results of significant diagnostics from this hospitalization (including imaging, microbiology, ancillary and laboratory) are listed below for reference.     Microbiology: No results found for this or any previous visit (from the past 240 hour(s)).   Labs:  CBC: Recent Labs  Lab 10/11/21 1130 10/12/21 0352 10/13/21 0443 10/14/21 0058 10/15/21 0541  WBC 6.0 7.4 5.1 5.4 4.7  NEUTROABS 4.0 4.8 3.5 3.8  --   HGB 14.4 13.1 12.4* 12.3* 11.0*  HCT 43.5 41.0 38.7* 38.1* 35.2*  MCV 86.5 87.6 89.6 88.8 90.0  PLT 214 226 218 235 211   BMP &GFR Recent Labs  Lab 10/11/21 1130 10/12/21 0352 10/13/21 0443 10/14/21 0058 10/15/21 0537  NA 135 135 134* 137 136  K 4.3 4.9 4.5 4.6 4.4  CL 97* 101 102 103 105  CO2 29 20* 21* 24 23  GLUCOSE 86 51* 53* 80 72  BUN 19 20 26* 32* 25*  CREATININE 0.89 0.92 1.13 1.30* 0.96  CALCIUM 9.8 8.8* 8.6* 8.9 8.6*  MG  --   --   --   --  2.0  PHOS  --   --   --   --  2.7   Estimated Creatinine Clearance: 78.6 mL/min (by C-G formula based on SCr of 0.96 mg/dL). Liver & Pancreas: Recent Labs  Lab 10/11/21 1130 10/12/21 0352 10/13/21 0443 10/14/21 0058 10/15/21 0537  AST 143* 135* 129* 127*  --   ALT 265* 242* 234* 243*  --   ALKPHOS 160* 119 111 112  --   BILITOT 1.3* 1.2 1.5* 1.8*  --   PROT 9.1* 8.1 7.4 7.8  --   ALBUMIN 3.7 3.0* 2.8* 2.9* 2.5*   No results for input(s): "LIPASE", "AMYLASE" in the last 168 hours. Recent Labs  Lab 10/12/21 0347  AMMONIA 32   Diabetic: No results for input(s): "HGBA1C" in the last 72 hours. No results for input(s): "GLUCAP" in the last 168 hours. Cardiac Enzymes: No results for input(s): "CKTOTAL", "CKMB", "CKMBINDEX", "TROPONINI" in the last 168 hours. No results for input(s): "PROBNP" in the last 8760 hours. Coagulation Profile: Recent Labs  Lab 10/11/21 1846 10/12/21 0352  INR 1.1 1.3*    Thyroid Function Tests: No results for input(s): "TSH", "T4TOTAL", "FREET4", "T3FREE", "THYROIDAB" in the last 72 hours. Lipid Profile: No results for input(s): "CHOL", "HDL", "LDLCALC", "TRIG", "CHOLHDL", "LDLDIRECT" in the last 72 hours. Anemia Panel: No results for input(s): "VITAMINB12", "FOLATE", "FERRITIN", "TIBC", "IRON", "RETICCTPCT" in the last 72 hours. Urine analysis:    Component Value Date/Time   COLORURINE YELLOW 01/11/2021 1424   APPEARANCEUR CLEAR 01/11/2021 1424   LABSPEC 1.016 01/11/2021 1424   PHURINE 6.0 01/11/2021 1424   GLUCOSEU NEGATIVE 01/11/2021 [place] 01/11/2021 [place] 01/11/2021 [place] 01/11/2021 1424   PROTEINUR <30 (A) 08/22/2021 1544   NITRITE NEGATIVE 01/11/2021 [place] 01/11/2021 1424   Sepsis Labs: Invalid input(s): "PROCALCITONIN", "LACTICIDVEN"  SIGNED:  Mercy Riding, MD  [facility] 10/15/2021, 6:42 PM

## 2021-10-15 NOTE — Evaluation (Addendum)
Physical Therapy Evaluation Patient Details Name: Franklin Rivera MRN: [id number] DOB: May 02, 1950 Today's Date: 10/15/2021  History of Present Illness  71 yo male admitted with acute bil LE DVT. Hx of hepatocellular ca  Clinical Impression  On eval, pt required Min guard-Min A for mobility. He walked ~350 feet around the unit (~175 feet with support of IV pole and then another ~175 feet without UE support). He is unsteady and generally weak. He tolerated activity well. D/C plan is for pt to return home where he lives with his family. He politely declines any HHPT f/u. Will plan to follow and progress activity as tolerated. Recommend daily ambulation with nursing supervision/assistance. Encouraged pt to slowly increase activity and to try to walk often at home. Instructed him to consult PCP for order for physical therapy if he feels he doesn't return to his PLOF/independence within a reasonable amount of time.      Recommendations for follow up therapy are one component of a multi-disciplinary discharge planning process, led by the attending physician.  Recommendations may be updated based on patient status, additional functional criteria and insurance authorization.  Follow Up Recommendations No PT follow up (pt politely declines HHPT f/u)      Assistance Recommended at Discharge PRN  Patient can return home with the following  Help with stairs or ramp for entrance;A little help with walking and/or transfers;Assist for transportation;Assistance with cooking/housework    Equipment Recommendations None recommended by PT  Recommendations for Other Services       Functional Status Assessment Patient has had a recent decline in their functional status and demonstrates the ability to make significant improvements in function in a reasonable and predictable amount of time.     Precautions / Restrictions Precautions Precautions: Fall Restrictions Weight Bearing Restrictions: No       Mobility  Bed Mobility Overal bed mobility: Modified Independent                  Transfers Overall transfer level: Modified independent                      Ambulation/Gait Ambulation/Gait assistance: Min guard, Min assist Gait Distance (Feet): 350 Feet Assistive device: None (IV pole vs no device) Gait Pattern/deviations: Step-through pattern, Decreased stride length       General Gait Details: Intermittent assist to steady throughout distance. Walked ~175 feet with support of IV pole then another ~175 feet without UE support.  Stairs Stairs: Yes  Min guard assist  Stair Management: Step to pattern, Two rails, Forwards Number of Stairs: 5 General stair comments: Cues for safety, technique (1 step at a time). Min guard assist.  Wheelchair Mobility    Modified Rankin (Stroke Patients Only)       Balance                                             Pertinent Vitals/Pain Pain Assessment Pain Assessment: Faces Faces Pain Scale: Hurts a little bit Pain Location: bil LEs and overal general soreness Pain Descriptors / Indicators: Sore Pain Intervention(s): Monitored during session    Home Living Family/patient expects to be discharged to:: Private residence Living Arrangements: Spouse/significant other Available Help at Discharge: Family Type of Home: Apartment Home Access: Level entry       Home Layout: Bed/bath upstairs Home Equipment: None  Prior Function Prior Level of Function : Independent/Modified Independent                     Hand Dominance        Extremity/Trunk Assessment   Upper Extremity Assessment Upper Extremity Assessment: Overall WFL for tasks assessed    Lower Extremity Assessment Lower Extremity Assessment: Generalized weakness    Cervical / Trunk Assessment Cervical / Trunk Assessment: Normal  Communication   Communication: No difficulties  Cognition Arousal/Alertness:  Awake/alert Behavior During Therapy: WFL for tasks assessed/performed Overall Cognitive Status: Within Functional Limits for tasks assessed                                          General Comments      Exercises     Assessment/Plan    PT Assessment Patient needs continued PT services  PT Problem List Decreased strength;Decreased balance;Decreased mobility;Decreased activity tolerance;Decreased knowledge of use of DME       PT Treatment Interventions DME instruction;Gait training;Functional mobility training;Therapeutic activities;Balance training;Patient/family education;Therapeutic exercise    PT Goals (Current goals can be found in the Care Plan section)  Acute Rehab PT Goals Patient Stated Goal: home soon PT Goal Formulation: With patient Time For Goal Achievement: 10/29/21 Potential to Achieve Goals: Good    Frequency Min 3X/week     Co-evaluation               AM-PAC PT "6 Clicks" Mobility  Outcome Measure Help needed turning from your back to your side while in a flat bed without using bedrails?: None Help needed moving from lying on your back to sitting on the side of a flat bed without using bedrails?: None Help needed moving to and from a bed to a chair (including a wheelchair)?: A Little Help needed standing up from a chair using your arms (e.g., wheelchair or bedside chair)?: A Little Help needed to walk in hospital room?: A Little Help needed climbing 3-5 steps with a railing? : A Little 6 Click Score: 20    End of Session Equipment Utilized During Treatment: Gait belt Activity Tolerance: Patient tolerated treatment well Patient left: in chair;with call bell/phone within reach;with family/visitor present   PT Visit Diagnosis: Muscle weakness (generalized) (M62.81);Difficulty in walking, not elsewhere classified (R26.2)    Time: 8413-2440 PT Time Calculation (min) (ACUTE ONLY): 13 min   Charges:   PT Evaluation $PT Eval Low  Complexity: [place], PT Acute Rehabilitation  Office: [phone] Pager: [phone]

## 2021-10-15 NOTE — Progress Notes (Signed)
Rx for Lovenox initially sent to [facility] outpatient pharmacy.  Patient was not able to pick.  New Rx sent to patient's primary pharmacy at [facility] on [address].

## 2021-10-15 NOTE — Progress Notes (Signed)
NUTRITION NOTE  Patient seen for full assessment by this RD yesterday. Serum vitamin D result: 12.2 (Reference range: 30-100).   Discharge order entered; no discharge summary yet.   Sent secure chat message to Attending to alert him of vitamin D result and request that vitamin D supplementation be added to list of meds patient should take following d/c.  Recommend checking serum vitamin A outpatient.     Jarome Matin, MS, RD, LDN, [place] Registered Dietitian II Inpatient Clinical Nutrition RD pager # and on-call/weekend pager # available in [facility]

## 2021-10-16 ENCOUNTER — Telehealth: Payer: Self-pay

## 2021-10-16 ENCOUNTER — Other Ambulatory Visit (HOSPITAL_COMMUNITY): Payer: Self-pay

## 2021-10-16 NOTE — Telephone Encounter (Signed)
This RN left voicemail to reschedule upcoming [facility] appointment. Per Eloise Harman., PA, patient can come in next week since he was recently discharged from the hospital.

## 2021-10-16 NOTE — Telephone Encounter (Signed)
This RN called patient to reschedule upcoming [facility] appointment. Patient confirmed [place] lab appointment Wednesday 7/26 and [facility] appointment.

## 2021-10-16 NOTE — Telephone Encounter (Signed)
Transition Care Management Follow-up Telephone Call Date of discharge and from where: [facility] 10/15/21 How have you been since you were released from the hospital? [place]  Any questions or concerns? No  Items Reviewed: Did the pt receive and understand the discharge instructions provided? Yes  Medications obtained and verified? Yes  Other? No  Any new allergies since your discharge? No  Dietary orders reviewed? Yes Do you have support at home? Yes   Home Care and Equipment/Supplies: Were home health services ordered? yes If so, what is the name of the agency?   Has the agency set up a time to come to the patient's home? no Were any new equipment or medical supplies ordered?  No What is the name of the medical supply agency?  Were you able to get the supplies/equipment? not applicable Do you have any questions related to the use of the equipment or supplies? No  Functional Questionnaire: (I = Independent and D = Dependent) ADLs: I  Bathing/Dressing- I  Meal Prep- I  Eating- I  Maintaining continence- I  Transferring/Ambulation- I  Managing Meds- I  Follow up appointments reviewed:  PCP Hospital f/u appt confirmed? No   pt will schedule a follow up  [facility] f/u appt confirmed?  no Are transportation arrangements needed? No  If their condition worsens, is the pt aware to call PCP or go to the Emergency Dept.? Yes Was the patient provided with contact information for the PCP's office or ED? Yes Was to pt encouraged to call back with questions or concerns? Yes

## 2021-10-17 ENCOUNTER — Inpatient Hospital Stay: Payer: Medicare HMO

## 2021-10-17 ENCOUNTER — Inpatient Hospital Stay: Payer: Medicare HMO | Admitting: Physician Assistant

## 2021-10-21 ENCOUNTER — Other Ambulatory Visit: Payer: Self-pay

## 2021-10-23 ENCOUNTER — Inpatient Hospital Stay: Payer: Medicare HMO

## 2021-10-23 ENCOUNTER — Inpatient Hospital Stay (HOSPITAL_BASED_OUTPATIENT_CLINIC_OR_DEPARTMENT_OTHER): Payer: Medicare HMO | Admitting: Physician Assistant

## 2021-10-23 ENCOUNTER — Other Ambulatory Visit: Payer: Self-pay

## 2021-10-23 VITALS — BP 131/81 | HR 93 | Temp 98.1°F | Resp 18 | Wt 176.1 lb

## 2021-10-23 DIAGNOSIS — I82403 Acute embolism and thrombosis of unspecified deep veins of lower extremity, bilateral: Secondary | ICD-10-CM

## 2021-10-23 DIAGNOSIS — E876 Hypokalemia: Secondary | ICD-10-CM

## 2021-10-23 DIAGNOSIS — R7989 Other specified abnormal findings of blood chemistry: Secondary | ICD-10-CM

## 2021-10-23 DIAGNOSIS — I824Z3 Acute embolism and thrombosis of unspecified deep veins of distal lower extremity, bilateral: Secondary | ICD-10-CM | POA: Diagnosis not present

## 2021-10-23 DIAGNOSIS — C220 Liver cell carcinoma: Secondary | ICD-10-CM

## 2021-10-23 DIAGNOSIS — M79662 Pain in left lower leg: Secondary | ICD-10-CM | POA: Diagnosis not present

## 2021-10-23 DIAGNOSIS — I10 Essential (primary) hypertension: Secondary | ICD-10-CM | POA: Diagnosis not present

## 2021-10-23 DIAGNOSIS — K5900 Constipation, unspecified: Secondary | ICD-10-CM | POA: Diagnosis not present

## 2021-10-23 DIAGNOSIS — E785 Hyperlipidemia, unspecified: Secondary | ICD-10-CM | POA: Diagnosis not present

## 2021-10-23 DIAGNOSIS — Z79899 Other long term (current) drug therapy: Secondary | ICD-10-CM | POA: Diagnosis not present

## 2021-10-23 LAB — CBC WITH DIFFERENTIAL (CANCER CENTER ONLY)
Abs Immature Granulocytes: 0.01 10*3/uL (ref 0.00–0.07)
Basophils Absolute: 0 10*3/uL (ref 0.0–0.1)
Basophils Relative: 0 %
Eosinophils Absolute: 0.2 10*3/uL (ref 0.0–0.5)
Eosinophils Relative: 3 %
HCT: 35.7 % — ABNORMAL LOW (ref 39.0–52.0)
Hemoglobin: 11.6 g/dL — ABNORMAL LOW (ref 13.0–17.0)
Immature Granulocytes: 0 %
Lymphocytes Relative: 21 %
Lymphs Abs: 1.1 10*3/uL (ref 0.7–4.0)
MCH: 28.4 pg (ref 26.0–34.0)
MCHC: 32.5 g/dL (ref 30.0–36.0)
MCV: 87.5 fL (ref 80.0–100.0)
Monocytes Absolute: 0.3 10*3/uL (ref 0.1–1.0)
Monocytes Relative: 6 %
Neutro Abs: 3.6 10*3/uL (ref 1.7–7.7)
Neutrophils Relative %: 70 %
Platelet Count: 255 10*3/uL (ref 150–400)
RBC: 4.08 MIL/uL — ABNORMAL LOW (ref 4.22–5.81)
RDW: 15.4 % (ref 11.5–15.5)
WBC Count: 5.1 10*3/uL (ref 4.0–10.5)
nRBC: 0 % (ref 0.0–0.2)

## 2021-10-23 LAB — CMP (CANCER CENTER ONLY)
ALT: 205 U/L — ABNORMAL HIGH (ref 0–44)
AST: 91 U/L — ABNORMAL HIGH (ref 15–41)
Albumin: 3.3 g/dL — ABNORMAL LOW (ref 3.5–5.0)
Alkaline Phosphatase: 181 U/L — ABNORMAL HIGH (ref 38–126)
Anion gap: 8 (ref 5–15)
BUN: 8 mg/dL (ref 8–23)
CO2: 26 mmol/L (ref 22–32)
Calcium: 8.5 mg/dL — ABNORMAL LOW (ref 8.9–10.3)
Chloride: 103 mmol/L (ref 98–111)
Creatinine: 0.56 mg/dL — ABNORMAL LOW (ref 0.61–1.24)
GFR, Estimated: >60 mL/min (ref >60)
Glucose, Bld: 99 mg/dL (ref 70–99)
Potassium: 3.1 mmol/L — ABNORMAL LOW (ref 3.5–5.1)
Sodium: 137 mmol/L (ref 135–145)
Total Bilirubin: 0.9 mg/dL (ref 0.3–1.2)
Total Protein: 8.1 g/dL (ref 6.5–8.1)

## 2021-10-23 MED ORDER — POTASSIUM CHLORIDE CRYS ER 20 MEQ PO TBCR: 20.0000 meq | EXTENDED_RELEASE_TABLET | Freq: Two times a day (BID) | ORAL | 0 refills | Status: DC

## 2021-10-23 NOTE — Progress Notes (Signed)
Symptom Management Consult note [place]    Patient Care Team: [name], MD as PCP - General (Family Medicine) Golden Circle, FNP as Nurse Practitioner (Family Medicine) [facility] as Consulting Physician Rozetta Nunnery, MD (Inactive) as Consulting Physician (Otolaryngology)    Name of the patient: Franklin Rivera  [id number]  01/09/1951   Date of visit: 10/23/2021    Chief complaint/ Reason for visit- recheck labs   Oncology History  Hepatocellular carcinoma ([place])  02/12/2021 Initial Diagnosis   Hepatocellular carcinoma ([place])   03/11/2021 Cancer Staging   Staging form: Liver, AJCC 8th Edition - Clinical: Stage II (cT2, cN0, cM0) - Signed by Brunetta Genera, MD on 03/11/2021 Histologic grade (G): GX Histologic grading system: 4 grade system   06/20/2021 -  Chemotherapy   Patient is on Treatment Plan : LUNG Atezolizumab + Bevacizumab q21d Maintenance       Current Therapy: Lenvatinib    Interval history- Franklin Rivera is a 71 y.o. with oncologic history as above presenting to [facility] today with chief complaint of lab check. Patient is accompanied by his spouse who provides additional history. Patient was seen in clinic 10/11/21 and found to have elevated liver enzymes likely secondary to his PO chemo. At that visit he was also diagnosed with extensive bilateral DVT and was admitted to the hospital. Patient was discharged on 10/15/21 on Lovenox.  Patient states since his hospital discharge he is feeling great. His pain medication was recently changed to oxycodone by his pain management clinic and he is only having to take it once per day. He is still holding his Levatinib as we discussed. He only has abdominal pain if he lays on his right side. His appetite has improved. He was experiencing constipation however after using a suppository that resolved. He denies any abnormal bleeding or bruising. Denies fever, chills,  shortness of breath, palpitations, syncope, extremity pain or swelling.    ROS  All other systems are reviewed and are negative for acute change except as noted in the HPI.    No Known Allergies   Past Medical History:  Diagnosis Date   Allergy    Cancer ([place]) 02/2021   liver   Headache    Hyperlipidemia      Past Surgical History:  Procedure Laterality Date   BLADDER SURGERY     IR 3D INDEPENDENT WKST  03/27/2021   IR ANGIOGRAM SELECTIVE EACH ADDITIONAL VESSEL  03/27/2021   IR ANGIOGRAM SELECTIVE EACH ADDITIONAL VESSEL  03/27/2021   IR ANGIOGRAM SELECTIVE EACH ADDITIONAL VESSEL  03/27/2021   IR ANGIOGRAM SELECTIVE EACH ADDITIONAL VESSEL  03/27/2021   IR ANGIOGRAM SELECTIVE EACH ADDITIONAL VESSEL  03/27/2021   IR ANGIOGRAM SELECTIVE EACH ADDITIONAL VESSEL  03/27/2021   IR ANGIOGRAM VISCERAL SELECTIVE  03/27/2021   IR EMBO TUMOR ORGAN ISCHEMIA INFARCT INC GUIDE ROADMAPPING  03/27/2021   IR RADIOLOGIST EVAL & MGMT  03/07/2021   IR RADIOLOGIST EVAL & MGMT  04/16/2021   IR RADIOLOGIST EVAL & MGMT  05/27/2021   IR US GUIDANCE  03/27/2021   NASAL FRACTURE SURGERY     Skull Surgery     fx forehead and graft from L occiput    Social History   Socioeconomic History   Marital status: Married    Spouse name: Not on file   Number of children: Not on file   Years of education: Not on file   Highest education level: Not on  file  Occupational History   Not on file  Tobacco Use   Smoking status: Never   Smokeless tobacco: Never  Vaping Use   Vaping Use: Never used  Substance and Sexual Activity   Alcohol use: Never   Drug use: Not Currently   Sexual activity: Not on file  Other Topics Concern   Not on file  Social History Narrative   Not on file   Social Determinants of Health   Financial Resource Strain: Low Risk  (08/27/2021)   Overall Financial Resource Strain (CARDIA)    Difficulty of Paying Living Expenses: Not hard at all  Food Insecurity: No Food  Insecurity (08/27/2021)   Hunger Vital Sign    Worried About Running Out of Food in the Last Year: Never true    [place] in the Last Year: Never true  Transportation Needs: No Transportation Needs (08/27/2021)   PRAPARE - Hydrologist (Medical): No    Lack of Transportation (Non-Medical): No  Physical Activity: Insufficiently Active (08/27/2021)   Exercise Vital Sign    Days of Exercise per Week: 3 days    Minutes of Exercise per Session: 30 min  Stress: No Stress Concern Present (08/27/2021)   Burden    Feeling of Stress : Not at all  Social Connections: Moderately Isolated (08/27/2021)   Social Connection and Isolation Panel [NHANES]    Frequency of Communication with Friends and Family: More than three times a week    Frequency of Social Gatherings with Friends and Family: More than three times a week    Attends Religious Services: Never    Marine scientist or Organizations: No    Attends Archivist Meetings: Never    Marital Status: Married  Human resources officer Violence: Not At Risk (08/27/2021)   Humiliation, Afraid, Rape, and Kick questionnaire    Fear of Current or Ex-Partner: No    Emotionally Abused: No    Physically Abused: No    Sexually Abused: No    Family History  Problem Relation Age of Onset   Cancer Neg Hx      Current Outpatient Medications:    potassium chloride SA (KLOR-CON M) 20 MEQ tablet, Take 1 tablet (20 mEq total) by mouth 2 (two) times daily for 7 days., Disp: 14 tablet, Rfl: 0   amLODipine (NORVASC) 10 MG tablet, Take 10 mg by mouth daily., Disp: , Rfl:    cetirizine (ZYRTEC) 10 MG tablet, Take 1 tablet (10 mg total) by mouth daily. (Patient not taking: Reported on 10/11/2021), Disp: 90 tablet, Rfl: 0   enoxaparin (LOVENOX) 80 MG/0.8ML injection, Inject 0.8 mLs (80 mg total) into the skin every 12 (twelve) hours., Disp: 48 mL, Rfl: 2    HYDROcodone-acetaminophen (NORCO) 10-325 MG tablet, Take 1 tablet by mouth 3 (three) times daily., Disp: , Rfl:    hydrocortisone (ANUSOL-HC) 2.5 % rectal cream, Place 1 Application rectally 2 (two) times daily. (Patient taking differently: Place 1 Application rectally 2 (two) times daily as needed for hemorrhoids.), Disp: 30 g, Rfl: 0   lenvatinib 12 mg daily dose (LENVIMA, 12 MG DAILY DOSE,) 3 x 4 MG capsule, Take 12 mg (3 capsules) by mouth daily. (Patient not taking: Reported on 10/11/2021), Disp: 90 capsule, Rfl: 1   Multiple Vitamin (MULTIVITAMIN WITH MINERALS) TABS tablet, Take 1 tablet by mouth daily., Disp: , Rfl:    ondansetron (ZOFRAN) 8 MG tablet, Take  1 tablet (8 mg total) by mouth 2 (two) times daily as needed (Nausea or vomiting). (Patient taking differently: Take 8 mg by mouth 2 (two) times daily as needed for nausea or vomiting.), Disp: 30 tablet, Rfl: 1   PRILOSEC OTC 20 MG tablet, Take 20 mg by mouth See admin instructions. Take 20 mg by mouth in the morning before breakfast and an additional 20 mg at bedtime as needed for reflux, Disp: , Rfl:    prochlorperazine (COMPAZINE) 10 MG tablet, Take 1 tablet (10 mg total) by mouth every 6 (six) hours as needed (Nausea or vomiting). (Patient taking differently: Take 10 mg by mouth every 6 (six) hours as needed for nausea or vomiting.), Disp: 30 tablet, Rfl: 1   senna (SENOKOT) 8.6 MG TABS tablet, Take 1 tablet (8.6 mg total) by mouth 2 (two) times daily., Disp: 120 tablet, Rfl: 0   Vitamin D, Ergocalciferol, (DRISDOL) 1.25 MG (50000 UNIT) CAPS capsule, Take 1 capsule (50,000 Units total) by mouth every 7 (seven) days., Disp: 7 capsule, Rfl: 0  PHYSICAL EXAM: ECOG FS:1 - Symptomatic but completely ambulatory    Vitals:   10/23/21 0935  BP: 131/81  Pulse: 93  Resp: 18  Temp: 98.1 F (36.7 C)  TempSrc: Oral  SpO2: 98%  Weight: 176 lb 1.6 oz (79.9 kg)   Physical Exam Vitals and nursing note reviewed.  Constitutional:       Appearance: He is well-developed. He is not ill-appearing or toxic-appearing.  HENT:     Head: Normocephalic and atraumatic.     Nose: Nose normal.  Eyes:     General: No scleral icterus.       Right eye: No discharge.        Left eye: No discharge.     Conjunctiva/sclera: Conjunctivae normal.  Neck:     Vascular: No JVD.  Cardiovascular:     Rate and Rhythm: Normal rate and regular rhythm.     Pulses: Normal pulses.     Heart sounds: Normal heart sounds.  Pulmonary:     Effort: Pulmonary effort is normal.     Breath sounds: Normal breath sounds.  Abdominal:     General: There is no distension.     Palpations: Abdomen is soft.  Musculoskeletal:        General: Normal range of motion.     Cervical back: Normal range of motion.     Comments: Mild left lower extremity swelling compare to right lower extremity. Compartments are soft in bilateral lower extremities and no calf tenderness  Skin:    General: Skin is warm and dry.  Neurological:     Mental Status: He is oriented to person, place, and time.     GCS: GCS eye subscore is 4. GCS verbal subscore is 5. GCS motor subscore is 6.     Comments: Fluent speech, no facial droop.  Psychiatric:        Behavior: Behavior normal.        LABORATORY DATA: I have reviewed the data as listed    Latest Ref Rng & Units 10/23/2021    9:15 AM 10/15/2021    5:41 AM 10/14/2021   12:58 AM  CBC  WBC 4.0 - 10.5 K/uL 5.1  4.7  5.4   Hemoglobin 13.0 - 17.0 g/dL 11.6  11.0  12.3   Hematocrit 39.0 - 52.0 % 35.7  35.2  38.1   Platelets 150 - 400 K/uL 255  211  235  Latest Ref Rng & Units 10/23/2021    9:15 AM 10/15/2021    5:37 AM 10/14/2021   12:58 AM  CMP  Glucose 70 - 99 mg/dL 99  72  80   BUN 8 - 23 mg/dL 8  25  32   Creatinine 0.61 - 1.24 mg/dL 0.56  0.96  1.30   Sodium 135 - 145 mmol/L 137  136  137   Potassium 3.5 - 5.1 mmol/L 3.1  4.4  4.6   Chloride 98 - 111 mmol/L 103  105  103   CO2 22 - 32 mmol/L '26  23  24    '$ Calcium 8.9 - 10.3 mg/dL 8.5  8.6  8.9   Total Protein 6.5 - 8.1 g/dL 8.1   7.8   Total Bilirubin 0.3 - 1.2 mg/dL 0.9   1.8   Alkaline Phos 38 - 126 U/L 181   112   AST 15 - 41 U/L 91   127   ALT 0 - 44 U/L 205   243        RADIOGRAPHIC STUDIES (from last 24 hours if applicable) I have personally reviewed the radiological images as listed and agreed with the findings in the report. No results found.     ASSESSMENT & PLAN: Patient is a 71 y.o. male  with oncologic history of hepatocellular carcinoma followed by Dr. Irene Limbo.  I have viewed most recent oncology note and lab work.   #) Bilateral DVT-  Patient will continue Lovenox injections as prescribed by hospitalist at discharge of recent admission. CBC today with stable anemia hgb 11.6 and normal platelets. No symptoms to suggest PE at this time.  #)Hypokalemia- Potassium low at 3.1. Short course of PO potassium sent to pharmacy.  #) Hepatocellular carcinoma with elevated LFTs - LFTS are improved today compared to prior. AST and ALT 91/205 from 127/243. Discussed patient with Dr. Irene Limbo who agrees with plan to Levanitib until next appointment with him is 11/01/21 so further discussion and goals of care discussion can be had. Patient is agreeable with plan.   Visit Diagnosis: 1. Acute deep vein thrombosis (DVT) of both lower extremities, unspecified vein (HCC)   2. Hepatocellular carcinoma (HCC)   3. Elevated LFTs   4. Hypokalemia      No orders of the defined types were placed in this encounter.   All questions were answered. The patient knows to call the clinic with any problems, questions or concerns. No barriers to learning was detected.  I have spent a total of 30 minutes minutes of face-to-face and non-face-to-face time, preparing to see the patient, obtaining and/or reviewing separately obtained history, performing a medically appropriate examination, counseling and educating the patient, ordering tests, documenting  clinical information in the electronic health record, and care coordination (communications with other health care professionals or caregivers).    Thank you for allowing me to participate in the care of this patient.    Barrie Folk, PA-C Department of Hematology/Oncology [facility] at [facility] Phone: [phone]  [phone]    10/23/2021 1:13 PM

## 2021-10-24 ENCOUNTER — Ambulatory Visit (INDEPENDENT_AMBULATORY_CARE_PROVIDER_SITE_OTHER): Payer: Medicare HMO | Admitting: Family Medicine

## 2021-10-24 ENCOUNTER — Encounter: Payer: Self-pay | Admitting: Family Medicine

## 2021-10-24 VITALS — BP 138/78 | HR 101 | Temp 98.2°F | Ht 72.0 in | Wt 174.4 lb

## 2021-10-24 DIAGNOSIS — C220 Liver cell carcinoma: Secondary | ICD-10-CM | POA: Diagnosis not present

## 2021-10-24 DIAGNOSIS — R079 Chest pain, unspecified: Secondary | ICD-10-CM | POA: Diagnosis not present

## 2021-10-24 DIAGNOSIS — I82413 Acute embolism and thrombosis of femoral vein, bilateral: Secondary | ICD-10-CM | POA: Diagnosis not present

## 2021-10-24 NOTE — Progress Notes (Signed)
   Franklin Rivera is a 71 y.o. male who presents today for an office visit.  Assessment/Plan:  New/Acute Problems: Chest Pain Possible musculoskeletal or due to his hepatocellular carcinoma.  EKG today with normal sinus rhythm and no signs of ischemic changes or any signs of right heart strain.  Differential also includes PE in setting of his lower extremity bilateral DVTs, especially in light of the recent finding of mobile thrombus, however given that he clinically appears well with no dyspnea or persistent chest pain, do not think getting a CTA will change management significantly at this point as he is already on anticoagulation with Lovenox and it very unlikely he has a large PE based on his clinical appearance.  Patient is agreeable to hold off on CTA for now.  We did discuss strict return precautions and reasons to return to the ED.  He will let us know if symptoms change.  Chronic Problems Addressed Today: Acute bilateral deep vein thrombosis (DVT) of femoral veins (HCC) Doing well on Lovenox injections twice daily.  He has been compliant.  No missed doses.  We will defer further management to oncology.  We will recheck CBC and c-Met today  Hepatocellular carcinoma ([place]) Continue management per oncology.    Subjective:  HPI:  Patient here for hospital follow-up.  Was admitted to the hospital on 10/11/2021 and discharged on 10/15/2021.  Initially presented to the emergency department with left lower extremity pain for 7 to 10 days.  Had an ultrasound which showed extensive bilateral DVT leading up to common femoral veins with a mobile thrombus in his right common femoral vein.  He was admitted for IV hydration.  IR was consulted but did think patient was a candidate for IVC filter.  He was transitioned to Lovenox and discharged home.  He is having some intermittent issues with chest pain. Started this morning. Located on right lower part of chest. No shortness of breath. No obvious  precipitating events. He has not had anything like this before.  Symptoms last for a few moments and then subside.      Objective:  Physical Exam: BP 138/78   Pulse (!) 101   Temp 98.2 F (36.8 C) (Temporal)   Ht 6' (1.829 m)   Wt 174 lb 6.4 oz (79.1 kg)   SpO2 96%   BMI 23.65 kg/m   Gen: No acute distress, resting comfortably CV: Regular rate and rhythm with no murmurs appreciated Pulm: Normal work of breathing, clear to auscultation bilaterally with no crackles, wheezes, or rhonchi MSK: No chest wall tenderness Neuro: Grossly normal, moves all extremities Psych: Normal affect and thought content  EKG: NSR.  No ischemic changes.  Time Spent: 50 minutes of total time was spent on the date of the encounter performing the following actions: chart review prior to seeing the patient including recent hospitalization, obtaining history, performing a medically necessary exam, counseling on the treatment plan, placing orders, and documenting in our EHR.        Algis Greenhouse. Jerline Pain, MD 10/24/2021 10:40 AM

## 2021-10-24 NOTE — Assessment & Plan Note (Signed)
Doing well on Lovenox injections twice daily.  He has been compliant.  No missed doses.  We will defer further management to oncology.  We will recheck CBC and c-Met today

## 2021-10-24 NOTE — Patient Instructions (Addendum)
It was very nice to see you today!  Your EKG is normal.  It is possible that you may have a small blood clot in your lungs.  We could check a CT scan however a small clot would not change your management at this point.  Please let us know or go to the emergency room if you have any worsening chest pain or shortness of breath.  We will see back in 6 months.  Come back sooner if needed.   Take care, Dr Jerline Pain  PLEASE NOTE:  If you had any lab tests please let us know if you have not heard back within a few days. You may see your results on mychart before we have a chance to review them but we will give you a call once they are reviewed by [place]. If we ordered any referrals today, please let us know if you have not heard from their office within the next week.   Please try these tips to maintain a healthy lifestyle:  Eat at least 3 REAL meals and 1-2 snacks per day.  Aim for no more than 5 hours between eating.  If you eat breakfast, please do so within one hour of getting up.   Each meal should contain half fruits/vegetables, one quarter protein, and one quarter carbs (no bigger than a computer mouse)  Cut down on sweet beverages. This includes juice, soda, and sweet tea.   Drink at least 1 glass of water with each meal and aim for at least 8 glasses per day  Exercise at least 150 minutes every week.

## 2021-10-24 NOTE — Assessment & Plan Note (Signed)
Continue management per oncology. 

## 2021-10-29 ENCOUNTER — Other Ambulatory Visit: Payer: Medicare HMO

## 2021-10-29 ENCOUNTER — Ambulatory Visit: Payer: Medicare HMO | Admitting: Hematology

## 2021-10-31 ENCOUNTER — Other Ambulatory Visit: Payer: Self-pay

## 2021-10-31 DIAGNOSIS — C220 Liver cell carcinoma: Secondary | ICD-10-CM

## 2021-11-01 ENCOUNTER — Other Ambulatory Visit: Payer: Self-pay

## 2021-11-01 ENCOUNTER — Inpatient Hospital Stay: Payer: Medicare HMO | Attending: Hematology

## 2021-11-01 ENCOUNTER — Other Ambulatory Visit (HOSPITAL_COMMUNITY): Payer: Self-pay

## 2021-11-01 ENCOUNTER — Inpatient Hospital Stay (HOSPITAL_BASED_OUTPATIENT_CLINIC_OR_DEPARTMENT_OTHER): Payer: Medicare HMO | Admitting: Hematology

## 2021-11-01 VITALS — BP 140/89 | HR 98 | Temp 97.5°F | Ht 72.0 in | Wt 172.8 lb

## 2021-11-01 DIAGNOSIS — C220 Liver cell carcinoma: Secondary | ICD-10-CM | POA: Insufficient documentation

## 2021-11-01 DIAGNOSIS — Z79899 Other long term (current) drug therapy: Secondary | ICD-10-CM | POA: Insufficient documentation

## 2021-11-01 DIAGNOSIS — Z7901 Long term (current) use of anticoagulants: Secondary | ICD-10-CM | POA: Insufficient documentation

## 2021-11-01 DIAGNOSIS — Z86718 Personal history of other venous thrombosis and embolism: Secondary | ICD-10-CM | POA: Insufficient documentation

## 2021-11-01 DIAGNOSIS — Z7189 Other specified counseling: Secondary | ICD-10-CM

## 2021-11-01 LAB — CBC WITH DIFFERENTIAL (CANCER CENTER ONLY)
Abs Immature Granulocytes: 0.01 10*3/uL (ref 0.00–0.07)
Basophils Absolute: 0 10*3/uL (ref 0.0–0.1)
Basophils Relative: 0 %
Eosinophils Absolute: 0.1 10*3/uL (ref 0.0–0.5)
Eosinophils Relative: 3 %
HCT: 34.3 % — ABNORMAL LOW (ref 39.0–52.0)
Hemoglobin: 11.3 g/dL — ABNORMAL LOW (ref 13.0–17.0)
Immature Granulocytes: 0 %
Lymphocytes Relative: 24 %
Lymphs Abs: 0.9 10*3/uL (ref 0.7–4.0)
MCH: 28.4 pg (ref 26.0–34.0)
MCHC: 32.9 g/dL (ref 30.0–36.0)
MCV: 86.2 fL (ref 80.0–100.0)
Monocytes Absolute: 0.3 10*3/uL (ref 0.1–1.0)
Monocytes Relative: 8 %
Neutro Abs: 2.5 10*3/uL (ref 1.7–7.7)
Neutrophils Relative %: 65 %
Platelet Count: 315 10*3/uL (ref 150–400)
RBC: 3.98 MIL/uL — ABNORMAL LOW (ref 4.22–5.81)
RDW: 15.5 % (ref 11.5–15.5)
WBC Count: 3.8 10*3/uL — ABNORMAL LOW (ref 4.0–10.5)
nRBC: 0 % (ref 0.0–0.2)

## 2021-11-01 LAB — CMP (CANCER CENTER ONLY)
ALT: 145 U/L — ABNORMAL HIGH (ref 0–44)
AST: 101 U/L — ABNORMAL HIGH (ref 15–41)
Albumin: 3.7 g/dL (ref 3.5–5.0)
Alkaline Phosphatase: 238 U/L — ABNORMAL HIGH (ref 38–126)
Anion gap: 8 (ref 5–15)
BUN: 9 mg/dL (ref 8–23)
CO2: 29 mmol/L (ref 22–32)
Calcium: 9.3 mg/dL (ref 8.9–10.3)
Chloride: 100 mmol/L (ref 98–111)
Creatinine: 0.68 mg/dL (ref 0.61–1.24)
GFR, Estimated: >60 mL/min (ref >60)
Glucose, Bld: 107 mg/dL — ABNORMAL HIGH (ref 70–99)
Potassium: 4 mmol/L (ref 3.5–5.1)
Sodium: 137 mmol/L (ref 135–145)
Total Bilirubin: 0.7 mg/dL (ref 0.3–1.2)
Total Protein: 8.6 g/dL — ABNORMAL HIGH (ref 6.5–8.1)

## 2021-11-01 MED ORDER — VITAMIN D (ERGOCALCIFEROL) 1.25 MG (50000 UNIT) PO CAPS: 50000.0000 [IU] | ORAL_CAPSULE | ORAL | 0 refills | Status: AC

## 2021-11-01 MED ORDER — OXYCODONE HCL 5 MG PO TABS
5.0000 mg | ORAL_TABLET | ORAL | 0 refills | Status: AC | PRN
  Filled 2021-11-01: qty 60, 10d supply, fill #0

## 2021-11-01 MED ORDER — DRONABINOL 2.5 MG PO CAPS
2.5000 mg | ORAL_CAPSULE | Freq: Two times a day (BID) | ORAL | 0 refills | Status: AC
  Filled 2021-11-01: qty 60, 30d supply, fill #0

## 2021-11-01 MED ORDER — LORAZEPAM 0.5 MG PO TABS
0.5000 mg | ORAL_TABLET | Freq: Three times a day (TID) | ORAL | 0 refills | Status: AC | PRN
  Filled 2021-11-01: qty 30, 10d supply, fill #0

## 2021-11-03 LAB — AFP TUMOR MARKER: AFP, Serum, Tumor Marker: 315 ng/mL — ABNORMAL HIGH (ref 0.0–8.4)

## 2021-11-04 ENCOUNTER — Other Ambulatory Visit (HOSPITAL_COMMUNITY): Payer: Self-pay

## 2021-11-06 ENCOUNTER — Other Ambulatory Visit (HOSPITAL_COMMUNITY): Payer: Self-pay

## 2021-11-08 ENCOUNTER — Encounter: Payer: Self-pay | Admitting: Hematology

## 2021-11-08 NOTE — Progress Notes (Addendum)
HEMATOLOGY/ONCOLOGY PHONE VISIT NOTE  Date of Service:11/01/2021    Patient Care Team: [name], MD as PCP - General (Family Medicine) Golden Circle, FNP as Nurse Practitioner (Family Medicine) [facility] as Consulting Physician Rozetta Nunnery, MD (Inactive) as Consulting Physician (Otolaryngology)  CHIEF COMPLAINTS/PURPOSE OF CONSULTATION:  Follow-up for continued evaluation and management of hepatocellular carcinoma and goals of care discussion  HISTORY OF PRESENTING ILLNESS:  Please see previous note for details on initial presentation  INTERVAL HISTORY  Patient is here with his wife for continued evaluation and management of his hepatocellular carcinoma. Since his last clinic visit he complained of bilateral lower extremity discomfort and cramping and had an ultrasound that showed lower extremity extensive DVTs.  His lenvatinib has been on hold he is been on Lovenox shots.  He notes his leg swelling is much improved. He notes he is a right upper abdominal discomfort. Labs done today were discussed with with him in detail.  His AFP tumor marker continues to increase and is up to 315. He notes that he has felt much better since he has been off the lenvatinib he has been eating better and has not had as much fatigue. Fritz Pickerel had a detailed discussion including the goals of the treatment being palliative.  Patient at this time she wishes to hold off on any additional palliative treatment and prefers to enroll with home hospice for his hepatocellular carcinoma.   MEDICAL HISTORY:  Past Medical History:  Diagnosis Date   Allergy    Cancer ([place]) 02/2021   liver   Headache    Hyperlipidemia     SURGICAL HISTORY: Past Surgical History:  Procedure Laterality Date   BLADDER SURGERY     IR 3D INDEPENDENT WKST  03/27/2021   IR ANGIOGRAM SELECTIVE EACH ADDITIONAL VESSEL  03/27/2021   IR ANGIOGRAM SELECTIVE EACH ADDITIONAL VESSEL  03/27/2021    IR ANGIOGRAM SELECTIVE EACH ADDITIONAL VESSEL  03/27/2021   IR ANGIOGRAM SELECTIVE EACH ADDITIONAL VESSEL  03/27/2021   IR ANGIOGRAM SELECTIVE EACH ADDITIONAL VESSEL  03/27/2021   IR ANGIOGRAM SELECTIVE EACH ADDITIONAL VESSEL  03/27/2021   IR ANGIOGRAM VISCERAL SELECTIVE  03/27/2021   IR EMBO TUMOR ORGAN ISCHEMIA INFARCT INC GUIDE ROADMAPPING  03/27/2021   IR RADIOLOGIST EVAL & MGMT  03/07/2021   IR RADIOLOGIST EVAL & MGMT  04/16/2021   IR RADIOLOGIST EVAL & MGMT  05/27/2021   IR US GUIDANCE  03/27/2021   NASAL FRACTURE SURGERY     Skull Surgery     fx forehead and graft from L occiput    SOCIAL HISTORY: Social History   Socioeconomic History   Marital status: Married    Spouse name: Not on file   Number of children: Not on file   Years of education: Not on file   Highest education level: Not on file  Occupational History   Not on file  Tobacco Use   Smoking status: Never   Smokeless tobacco: Never  Vaping Use   Vaping Use: Never used  Substance and Sexual Activity   Alcohol use: Never   Drug use: Not Currently   Sexual activity: Not on file  Other Topics Concern   Not on file  Social History Narrative   Not on file   Social Determinants of Health   Financial Resource Strain: Low Risk  (08/27/2021)   Overall Financial Resource Strain (CARDIA)    Difficulty of Paying Living Expenses: Not hard at all  Food Insecurity:  No Food Insecurity (08/27/2021)   Hunger Vital Sign    Worried About Running Out of Food in the Last Year: Never true    Ran Out of Food in the Last Year: Never true  Transportation Needs: No Transportation Needs (08/27/2021)   PRAPARE - Hydrologist (Medical): No    Lack of Transportation (Non-Medical): No  Physical Activity: Insufficiently Active (08/27/2021)   Exercise Vital Sign    Days of Exercise per Week: 3 days    Minutes of Exercise per Session: 30 min  Stress: No Stress Concern Present (08/27/2021)   [place]    Feeling of Stress : Not at all  Social Connections: Moderately Isolated (08/27/2021)   Social Connection and Isolation Panel [NHANES]    Frequency of Communication with Friends and Family: More than three times a week    Frequency of Social Gatherings with Friends and Family: More than three times a week    Attends Religious Services: Never    Marine scientist or Organizations: No    Attends Archivist Meetings: Never    Marital Status: Married  Human resources officer Violence: Not At Risk (08/27/2021)   Humiliation, Afraid, Rape, and Kick questionnaire    Fear of Current or Ex-Partner: No    Emotionally Abused: No    Physically Abused: No    Sexually Abused: No    FAMILY HISTORY: Family History  Problem Relation Age of Onset   Cancer Neg Hx     ALLERGIES:  has No Known Allergies.  MEDICATIONS:  Current Outpatient Medications  Medication Sig Dispense Refill   amLODipine (NORVASC) 10 MG tablet Take 10 mg by mouth daily.     cetirizine (ZYRTEC) 10 MG tablet Take 1 tablet (10 mg total) by mouth daily. 90 tablet 0   dronabinol (MARINOL) 2.5 MG capsule Take 1 capsule (2.5 mg total) by mouth 2 (two) times daily before a meal. 60 capsule 0   enoxaparin (LOVENOX) 80 MG/0.8ML injection Inject 0.8 mLs (80 mg total) into the skin every 12 (twelve) hours. 48 mL 2   LORazepam (ATIVAN) 0.5 MG tablet Take 1 tablet (0.5 mg total) by mouth every 8 (eight) hours as needed for anxiety or sleep. 30 tablet 0   Multiple Vitamin (MULTIVITAMIN WITH MINERALS) TABS tablet Take 1 tablet by mouth daily.     ondansetron (ZOFRAN) 8 MG tablet Take 1 tablet (8 mg total) by mouth 2 (two) times daily as needed (Nausea or vomiting). (Patient taking differently: Take 8 mg by mouth 2 (two) times daily as needed for nausea or vomiting.) 30 tablet 1   oxyCODONE (OXY IR/ROXICODONE) 5 MG immediate release tablet Take 1 tablet (5 mg total) by  mouth every 4 (four) hours as needed for severe pain. 60 tablet 0   PRILOSEC OTC 20 MG tablet Take 20 mg by mouth See admin instructions. Take 20 mg by mouth in the morning before breakfast and an additional 20 mg at bedtime as needed for reflux     senna (SENOKOT) 8.6 MG TABS tablet Take 1 tablet (8.6 mg total) by mouth 2 (two) times daily. 120 tablet 0   hydrocortisone (ANUSOL-HC) 2.5 % rectal cream Place 1 Application rectally 2 (two) times daily. (Patient not taking: Reported on 11/01/2021) 30 g 0   potassium chloride SA (KLOR-CON M) 20 MEQ tablet Take 1 tablet (20 mEq total) by mouth 2 (two) times daily for 7 days.  14 tablet 0   prochlorperazine (COMPAZINE) 10 MG tablet Take 1 tablet (10 mg total) by mouth every 6 (six) hours as needed (Nausea or vomiting). (Patient not taking: Reported on 11/01/2021) 30 tablet 1   Vitamin D, Ergocalciferol, (DRISDOL) 1.25 MG (50000 UNIT) CAPS capsule Take 1 capsule (50,000 Units total) by mouth every 7 (seven) days. 7 capsule 0   No current facility-administered medications for this visit.    REVIEW OF SYSTEMS:   10 Point review of Systems was done is negative except as noted above.  PHYSICAL EXAMINATION:  .BP (!) 140/89 (BP Location: Left Arm, Patient Position: Sitting)   Pulse 98   Temp (!) 97.5 F (36.4 C) (Oral)   Ht 6' (1.829 m)   Wt 172 lb 12.8 oz (78.4 kg)   SpO2 97%   BMI 23.44 kg/m  . GENERAL:alert, in no acute distress and comfortable SKIN: no acute rashes, no significant lesions EYES: conjunctiva are pink and non-injected, sclera anicteric OROPHARYNX: MMM, no exudates, no oropharyngeal erythema or ulceration NECK: supple, no JVD LYMPH:  no palpable lymphadenopathy in the cervical, axillary or inguinal regions LUNGS: clear to auscultation b/l with normal respiratory effort HEART: regular rate & rhythm ABDOMEN:  normoactive bowel sounds , non tender, not distended. Extremity: no pedal edema PSYCH: alert & oriented x 3 with fluent  speech NEURO: no focal motor/sensory deficits    LABORATORY DATA:  I have reviewed the data as listed  .    Latest Ref Rng & Units 11/01/2021    9:01 AM 10/23/2021    9:15 AM 10/15/2021    5:41 AM  CBC  WBC 4.0 - 10.5 K/uL 3.8  5.1  4.7   Hemoglobin 13.0 - 17.0 g/dL 11.3  11.6  11.0   Hematocrit 39.0 - 52.0 % 34.3  35.7  35.2   Platelets 150 - 400 K/uL 315  255  211     .    Latest Ref Rng & Units 11/01/2021    9:01 AM 10/23/2021    9:15 AM 10/15/2021    5:37 AM  CMP  Glucose 70 - 99 mg/dL 107  99  72   BUN 8 - 23 mg/dL '9  8  25   '$ Creatinine 0.61 - 1.24 mg/dL 0.68  0.56  0.96   Sodium 135 - 145 mmol/L 137  137  136   Potassium 3.5 - 5.1 mmol/L 4.0  3.1  4.4   Chloride 98 - 111 mmol/L 100  103  105   CO2 22 - 32 mmol/L '29  26  23   '$ Calcium 8.9 - 10.3 mg/dL 9.3  8.5  8.6   Total Protein 6.5 - 8.1 g/dL 8.6  8.1    Total Bilirubin 0.3 - 1.2 mg/dL 0.7  0.9    Alkaline Phos 38 - 126 U/L 238  181    AST 15 - 41 U/L 101  91    ALT 0 - 44 U/L 145  205     HCV FibroSURE Order: [id number] Status: Final result   Visible to patient: Yes (not seen)   Next appt: 02/05/2021 at 08:30 AM in Radiology ([facility])   Dx: Liver mass   0 Result Notes Component Ref Range & Units 10 d ago 1 yr ago  Fibrosis Score 0.00 - 0.21 0.80 High   0.72 R   Fibrosis Stage   Cirrhosis  F3        RADIOGRAPHIC STUDIES: I have personally reviewed the radiological images  as listed and agreed with the findings in the report. VAS [place] LOWER EXTREMITY VENOUS (DVT)  Result Date: 10/12/2021  Lower Venous DVT Study Patient Name:  TRAMPUS MCQUERRY  Date of Exam:   10/11/2021 Medical Rec #: [id number]        Accession #:    [id number] Date of Birth: [birth year]        Patient Gender: M Patient Age:   47 years Exam Location:  [facility] Procedure:      VAS [place] LOWER EXTREMITY VENOUS (DVT) Referring Phys: Sherol Dade --------------------------------------------------------------------------------   Indications: Pain.  Comparison Study: No prior study Performing Technologist: Maudry Mayhew MHA, RDMS, RVT, RDCS  Examination Guidelines: A complete evaluation includes B-mode imaging, spectral Doppler, color Doppler, and power Doppler as needed of all accessible portions of each vessel. Bilateral testing is considered an integral part of a complete examination. Limited examinations for reoccurring indications may be performed as noted. The reflux portion of the exam is performed with the patient in reverse Trendelenburg.  +---------+---------------+---------+-----------+----------+------------------+ RIGHT    CompressibilityPhasicitySpontaneityPropertiesThrombus Aging     +---------+---------------+---------+-----------+----------+------------------+ CFV                     Yes      Yes                  Mobile acute                                                             thrombus           +---------+---------------+---------+-----------+----------+------------------+ SFJ                              Yes                  Acute              +---------+---------------+---------+-----------+----------+------------------+ FV Prox                          No                   Acute              +---------+---------------+---------+-----------+----------+------------------+ FV Mid   None                                         Acute              +---------+---------------+---------+-----------+----------+------------------+ FV DistalNone                                         Acute              +---------+---------------+---------+-----------+----------+------------------+ PFV                              No                   Acute              +---------+---------------+---------+-----------+----------+------------------+  POP      Full           Yes      Yes                                      +---------+---------------+---------+-----------+----------+------------------+ PTV      Full                    Yes                                     +---------+---------------+---------+-----------+----------+------------------+ PERO     Full                    Yes                                     +---------+---------------+---------+-----------+----------+------------------+ EIV                     Yes      Yes                                     +---------+---------------+---------+-----------+----------+------------------+ CIV                     Yes      Yes                                     +---------+---------------+---------+-----------+----------+------------------+ Unable to visualize IVC secondary to overlying bowel gas. All compression maneuvers not performed secondary to mobile CFV thrombus.  +---------+---------------+---------+-----------+----------+--------------+ LEFT     CompressibilityPhasicitySpontaneityPropertiesThrombus Aging +---------+---------------+---------+-----------+----------+--------------+ CFV      Full           Yes      Yes                                 +---------+---------------+---------+-----------+----------+--------------+ SFJ      Full                                                        +---------+---------------+---------+-----------+----------+--------------+ FV Prox  Full                                                        +---------+---------------+---------+-----------+----------+--------------+ FV Mid   Full                                                        +---------+---------------+---------+-----------+----------+--------------+ FV DistalFull                                                        +---------+---------------+---------+-----------+----------+--------------+  PFV      Full                                                         +---------+---------------+---------+-----------+----------+--------------+ POP      None                    No                   Acute          +---------+---------------+---------+-----------+----------+--------------+ PTV      None                    No                   Acute          +---------+---------------+---------+-----------+----------+--------------+ PERO     None                    No                   Acute          +---------+---------------+---------+-----------+----------+--------------+ EIV                     Yes      Yes                                 +---------+---------------+---------+-----------+----------+--------------+ CIV                     Yes      Yes                                 +---------+---------------+---------+-----------+----------+--------------+     Summary: RIGHT: - Findings consistent with acute deep vein thrombosis involving the right common femoral vein, right femoral vein, and right proximal profunda vein. Thrombus in the right common femoral vein appears to be mobile. - No cystic structure found in the popliteal fossa.  LEFT: - Findings consistent with acute deep vein thrombosis involving the left popliteal vein, left posterior tibial veins, and left peroneal veins. - No cystic structure found in the popliteal fossa.  *See table(s) above for measurements and observations. Electronically signed by Harold Barban MD on 10/12/2021 at 12:50:40 AM.    Final     ASSESSMENT & PLAN:   71 year old male with history of hepatitis C with recently diagnosed hepatocellular carcinoma.  1) progressive locally advanced unresectable hepatocellular carcinoma  told he does not have overt cirrhotic morphology on MRI he previously has had a fibrosis score of F3. MRI is LIRADS 5 concern for hepatocellular carcinoma. Patient has been seen by Dr. Kathlene Cote and is status post Y90 treatment CT abdomen on 05/20/2021 showed interval progression of  dominant mass in the right hepatic lobe compared to CT scan and November 2022.  New malignant lesion in the dome of the liver. 2) liver cirrhosis likely related to hepatitis C -HCV FibroSure testing was done and shows F4 status consistent with liver cirrhosis Has been treated with Epclusa for 12 weeks starting 07/12/2020 with Terri Piedra FNP.  #3 bilateral extensive DVTs related  to his progressive hepatocellular carcinoma and Avastin and lenvatinib PLAN -Patient's labs from today were discussed with him in detail.  His AFP tumor marker continues to increase and is up to 315. -His liver enzymes have been progressively abnormal in the context of progression of his hepatocellular carcinoma. -He is currently on lenvatinib in the context of his DVTs abnormal liver functions and fatigue. -We added detailed discussion regarding goals of care.  Patient clearly notes that his goals are to be kept comfortable through best supportive care through hospice and he is not inclined to try any other new targeted therapies since they make him sick and he knows they are only of limited benefit at this time. -We will refer him to a [facility] care hospice. -He was given prescription for Marinol to help with his appetite and nausea -Will continue Lovenox shots at this time for his extensive lower extremity cancer related DVTs.  Since he is under supportive cares he could be switched to Eliquis after completing his current prescription of Lovenox. - Follow-up Referral to total care hospice for continued best supportive cares.  The total time spent in the appointment was 35 minutes*.  All of the patient's questions were answered with apparent satisfaction. The patient knows to call the clinic with any problems, questions or concerns.   Sullivan Lone MD MS AAHIVMS [facility] Hematology/Oncology Physician [facility]  .*Total Encounter Time as defined by the Centers for Medicare and Medicaid Services includes,  in addition to the face-to-face time of a patient visit (documented in the note above) non-face-to-face time: obtaining and reviewing outside history, ordering and reviewing medications, tests or procedures, care coordination (communications with other health care professionals or caregivers) and documentation in the medical record.

## 2021-11-11 ENCOUNTER — Other Ambulatory Visit: Payer: Self-pay

## 2021-11-11 DIAGNOSIS — C220 Liver cell carcinoma: Secondary | ICD-10-CM

## 2021-11-11 DIAGNOSIS — I82403 Acute embolism and thrombosis of unspecified deep veins of lower extremity, bilateral: Secondary | ICD-10-CM

## 2021-11-11 MED ORDER — APIXABAN 5 MG PO TABS: 5.0000 mg | ORAL_TABLET | Freq: Two times a day (BID) | ORAL | 2 refills | Status: AC

## 2021-11-13 ENCOUNTER — Encounter: Payer: Self-pay | Admitting: Hematology

## 2021-11-13 NOTE — Progress Notes (Signed)
Patient called regarding remaining grant balance and how it could be utilized if he doesn't have to come back.  Provided information regarding grant expense sheet. He verbalized understanding.  He has my card for any additional financial questions or concerns.

## 2021-11-14 ENCOUNTER — Other Ambulatory Visit: Payer: Self-pay

## 2021-11-14 DIAGNOSIS — C220 Liver cell carcinoma: Secondary | ICD-10-CM

## 2021-12-12 ENCOUNTER — Encounter: Payer: Self-pay | Admitting: Hematology

## 2021-12-16 ENCOUNTER — Ambulatory Visit: Admitting: Family Medicine

## 2021-12-16 ENCOUNTER — Encounter: Payer: Self-pay | Admitting: Family Medicine

## 2021-12-16 VITALS — BP 127/76 | HR 97 | Temp 98.6°F | Ht 72.0 in | Wt 162.2 lb

## 2021-12-16 DIAGNOSIS — I82413 Acute embolism and thrombosis of femoral vein, bilateral: Secondary | ICD-10-CM

## 2021-12-16 DIAGNOSIS — C220 Liver cell carcinoma: Secondary | ICD-10-CM | POA: Diagnosis not present

## 2021-12-16 DIAGNOSIS — G8929 Other chronic pain: Secondary | ICD-10-CM

## 2021-12-16 MED ORDER — MELOXICAM 15 MG PO TABS: 15.0000 mg | ORAL_TABLET | Freq: Every day | ORAL | 5 refills | Status: AC

## 2021-12-16 MED ORDER — OMEPRAZOLE 40 MG PO CPDR: 40.0000 mg | DELAYED_RELEASE_CAPSULE | Freq: Every day | ORAL | 3 refills | Status: AC

## 2021-12-16 MED ORDER — BACLOFEN 10 MG PO TABS: 10.0000 mg | ORAL_TABLET | Freq: Three times a day (TID) | ORAL | 3 refills | Status: AC

## 2021-12-16 NOTE — Patient Instructions (Signed)
It was very nice to see you today!  Please start the meloxicam and baclofen.  Please take the acid blocker while you are on meloxicam.  Let me know in a few weeks how you are doing.  Take care, Dr Jerline Pain  PLEASE NOTE:  If you had any lab tests please let us know if you have not heard back within a few days. You may see your results on mychart before we have a chance to review them but we will give you a call once they are reviewed by [place]. If we ordered any referrals today, please let us know if you have not heard from their office within the next week.   Please try these tips to maintain a healthy lifestyle:  Eat at least 3 REAL meals and 1-2 snacks per day.  Aim for no more than 5 hours between eating.  If you eat breakfast, please do so within one hour of getting up.   Each meal should contain half fruits/vegetables, one quarter protein, and one quarter carbs (no bigger than a computer mouse)  Cut down on sweet beverages. This includes juice, soda, and sweet tea.   Drink at least 1 glass of water with each meal and aim for at least 8 glasses per day  Exercise at least 150 minutes every week.

## 2021-12-16 NOTE — Progress Notes (Signed)
   Franklin Rivera is a 71 y.o. male who presents today for an office visit.  Assessment/Plan:  New/Acute Problems: Upper Back Pain Seems to be more muscular based on tenderness on palpation.  Does have a known history of hepatocellular carcinoma which could be contributing as well.  He did have an x-ray done a couple of months ago which was negative.  He will continue taking oxycodone per direction of hospice.  We will add on meloxicam 15 mg daily and baclofen 10 mg 3 times daily as needed.  He can also use Tylenol as needed.  Advised him to start Prilosec while on meloxicam.  He will follow-up me in a few weeks via MyChart.  If pain persist would consider referral to orthopedics or sports medicine versus PT.  Chronic Problems Addressed Today: Chronic pain Continue management per hospice.  We will be adding on meloxicam and baclofen as above.  Hepatocellular carcinoma ([place]) Continue management per oncology.  Acute bilateral deep vein thrombosis (DVT) of femoral veins (HCC) Anticoagulated on Eliquis 5 mg twice daily per oncology.  We will add on PPI while he is on NSAID as above.     Subjective:  HPI:  See A/p for status of chronic conditions  He is here today with upper back pain.  This has been a persistent issue for the last several months.  He was diagnosed with hepatocellular carcinoma last year has been following with oncology for this.  He has been referred to hospice and they have been managing his pain control however he states that the oxycodone may have prescribed him has not adequately controlling his pain.  He was doing well until they had a recent manufacturer change and he has noticed a significant difference in pain control on the new medication.  He has been on muscle relaxers in the past for this which is worked well.       Objective:  Physical Exam: BP 127/76   Pulse 97   Temp 98.6 F (37 C) (Temporal)   Ht 6' (1.829 m)   Wt 162 lb 3.2 oz (73.6 kg)   SpO2 96%    BMI 22.00 kg/m   Gen: No acute distress, resting comfortably MSK: - Back: Tenderness palpation along mid thoracic paraspinal muscles. Neuro: Grossly normal, moves all extremities Psych: Normal affect and thought content      Arien Morine M. Jerline Pain, MD 12/16/2021 10:23 AM

## 2021-12-16 NOTE — Assessment & Plan Note (Signed)
Continue management per hospice.  We will be adding on meloxicam and baclofen as above.

## 2021-12-16 NOTE — Assessment & Plan Note (Signed)
Continue management per oncology. 

## 2021-12-16 NOTE — Assessment & Plan Note (Signed)
Anticoagulated on Eliquis 5 mg twice daily per oncology.  We will add on PPI while he is on NSAID as above.

## 2021-12-18 ENCOUNTER — Emergency Department (HOSPITAL_COMMUNITY)

## 2021-12-18 ENCOUNTER — Emergency Department (HOSPITAL_COMMUNITY)
Admission: EM | Admit: 2021-12-18 | Discharge: 2021-12-18 | Disposition: A | Discharge status: Home / Self Care | Attending: Emergency Medicine | Admitting: Emergency Medicine

## 2021-12-18 ENCOUNTER — Encounter (HOSPITAL_COMMUNITY): Payer: Self-pay

## 2021-12-18 DIAGNOSIS — C220 Liver cell carcinoma: Secondary | ICD-10-CM | POA: Diagnosis not present

## 2021-12-18 DIAGNOSIS — S24109A Unspecified injury at unspecified level of thoracic spinal cord, initial encounter: Secondary | ICD-10-CM | POA: Diagnosis present

## 2021-12-18 DIAGNOSIS — R112 Nausea with vomiting, unspecified: Secondary | ICD-10-CM | POA: Diagnosis not present

## 2021-12-18 DIAGNOSIS — Z7901 Long term (current) use of anticoagulants: Secondary | ICD-10-CM | POA: Insufficient documentation

## 2021-12-18 DIAGNOSIS — R109 Unspecified abdominal pain: Secondary | ICD-10-CM | POA: Diagnosis not present

## 2021-12-18 DIAGNOSIS — S22059A Unspecified fracture of T5-T6 vertebra, initial encounter for closed fracture: Secondary | ICD-10-CM

## 2021-12-18 DIAGNOSIS — Z79899 Other long term (current) drug therapy: Secondary | ICD-10-CM | POA: Insufficient documentation

## 2021-12-18 DIAGNOSIS — S22058A Other fracture of T5-T6 vertebra, initial encounter for closed fracture: Secondary | ICD-10-CM | POA: Diagnosis not present

## 2021-12-18 DIAGNOSIS — X58XXXA Exposure to other specified factors, initial encounter: Secondary | ICD-10-CM | POA: Insufficient documentation

## 2021-12-18 LAB — I-STAT CHEM 8, ED
BUN: 8 mg/dL (ref 8–23)
Calcium, Ion: 1.23 mmol/L (ref 1.15–1.40)
Chloride: 94 mmol/L — ABNORMAL LOW (ref 98–111)
Creatinine, Ser: 0.5 mg/dL — ABNORMAL LOW (ref 0.61–1.24)
Glucose, Bld: 91 mg/dL (ref 70–99)
HCT: 36 % — ABNORMAL LOW (ref 39.0–52.0)
Hemoglobin: 12.2 g/dL — ABNORMAL LOW (ref 13.0–17.0)
Potassium: 3.8 mmol/L (ref 3.5–5.1)
Sodium: 135 mmol/L (ref 135–145)
TCO2: 27 mmol/L (ref 22–32)

## 2021-12-18 LAB — COMPREHENSIVE METABOLIC PANEL
ALT: 609 U/L — ABNORMAL HIGH (ref 0–44)
AST: 305 U/L — ABNORMAL HIGH (ref 15–41)
Albumin: 3.7 g/dL (ref 3.5–5.0)
Alkaline Phosphatase: 204 U/L — ABNORMAL HIGH (ref 38–126)
Anion gap: 15 (ref 5–15)
BUN: 10 mg/dL (ref 8–23)
CO2: 25 mmol/L (ref 22–32)
Calcium: 10.4 mg/dL — ABNORMAL HIGH (ref 8.9–10.3)
Chloride: 95 mmol/L — ABNORMAL LOW (ref 98–111)
Creatinine, Ser: 0.64 mg/dL (ref 0.61–1.24)
GFR, Estimated: >60 mL/min (ref >60)
Glucose, Bld: 98 mg/dL (ref 70–99)
Potassium: 3.7 mmol/L (ref 3.5–5.1)
Sodium: 135 mmol/L (ref 135–145)
Total Bilirubin: 1.9 mg/dL — ABNORMAL HIGH (ref 0.3–1.2)
Total Protein: 7.8 g/dL (ref 6.5–8.1)

## 2021-12-18 LAB — CBC WITH DIFFERENTIAL/PLATELET
Abs Immature Granulocytes: 0.01 10*3/uL (ref 0.00–0.07)
Basophils Absolute: 0 10*3/uL (ref 0.0–0.1)
Basophils Relative: 0 %
Eosinophils Absolute: 0 10*3/uL (ref 0.0–0.5)
Eosinophils Relative: 1 %
HCT: 30.9 % — ABNORMAL LOW (ref 39.0–52.0)
Hemoglobin: 9.9 g/dL — ABNORMAL LOW (ref 13.0–17.0)
Immature Granulocytes: 0 %
Lymphocytes Relative: 14 %
Lymphs Abs: 0.5 10*3/uL — ABNORMAL LOW (ref 0.7–4.0)
MCH: 27.6 pg (ref 26.0–34.0)
MCHC: 32 g/dL (ref 30.0–36.0)
MCV: 86.1 fL (ref 80.0–100.0)
Monocytes Absolute: 0.2 10*3/uL (ref 0.1–1.0)
Monocytes Relative: 6 %
Neutro Abs: 2.8 10*3/uL (ref 1.7–7.7)
Neutrophils Relative %: 79 %
Platelets: 255 10*3/uL (ref 150–400)
RBC: 3.59 MIL/uL — ABNORMAL LOW (ref 4.22–5.81)
RDW: 19.4 % — ABNORMAL HIGH (ref 11.5–15.5)
WBC: 3.5 10*3/uL — ABNORMAL LOW (ref 4.0–10.5)
nRBC: 0 % (ref 0.0–0.2)

## 2021-12-18 LAB — TROPONIN I (HIGH SENSITIVITY): Troponin I (High Sensitivity): 8 ng/L (ref <18)

## 2021-12-18 LAB — LIPASE, BLOOD: Lipase: 159 U/L — ABNORMAL HIGH (ref 11–51)

## 2021-12-18 MED ORDER — DIPHENHYDRAMINE HCL 50 MG/ML IJ SOLN
25.0000 mg | Freq: Once | INTRAMUSCULAR | Status: AC
  Administered 2021-12-18: 25 mg via INTRAVENOUS
  Filled 2021-12-18: qty 1

## 2021-12-18 MED ORDER — HYDROMORPHONE HCL 1 MG/ML IJ SOLN
1.0000 mg | Freq: Once | INTRAMUSCULAR | Status: AC
  Administered 2021-12-18: 1 mg via INTRAVENOUS
  Filled 2021-12-18: qty 1

## 2021-12-18 MED ORDER — SODIUM CHLORIDE 0.9 % IV BOLUS
1000.0000 mL | Freq: Once | INTRAVENOUS | Status: AC
  Administered 2021-12-18: 1000 mL via INTRAVENOUS

## 2021-12-18 MED ORDER — IOHEXOL 350 MG/ML SOLN
100.0000 mL | Freq: Once | INTRAVENOUS | Status: AC | PRN
  Administered 2021-12-18: 100 mL via INTRAVENOUS

## 2021-12-18 MED ORDER — SODIUM CHLORIDE (PF) 0.9 % IJ SOLN
INTRAMUSCULAR | Status: AC
  Filled 2021-12-18: qty 50

## 2021-12-18 MED ORDER — METOCLOPRAMIDE HCL 5 MG/ML IJ SOLN
10.0000 mg | Freq: Once | INTRAMUSCULAR | Status: AC
  Administered 2021-12-18: 10 mg via INTRAVENOUS
  Filled 2021-12-18: qty 2

## 2021-12-18 NOTE — ED Provider Notes (Signed)
[facility] DEPT Provider Note   CSN: [id number] Arrival date & time: 12/18/21  1607     History  Chief Complaint  Patient presents with   Abdominal Pain   Nausea    Franklin Rivera is a 71 y.o. male.  71 yo M with a chief complaint of R upper back pain, sleepiness, n/v.  Going on for the past week or so.  Seen by PCP and oncology.  Was started on some new medications recently.  Was very sleepy yesterday after starting baclofen and muscle relaxants.  Woke this morning and started having some nausea and then vomiting.  He has a history of hepatocellular carcinoma.  Is currently under pain management for his discomfort.  No fevers.  No cough congestion.   Abdominal Pain      Home Medications Prior to Admission medications   Medication Sig Start Date End Date Taking? Authorizing Provider  amLODipine (NORVASC) 10 MG tablet Take 10 mg by mouth daily. 09/06/21   [provider]  apixaban (ELIQUIS) 5 MG TABS tablet Take 1 tablet (5 mg total) by mouth 2 (two) times daily. 11/11/21   Brunetta Genera, MD  baclofen (LIORESAL) 10 MG tablet Take 1 tablet (10 mg total) by mouth 3 (three) times daily. 12/16/21   [name], MD  cetirizine (ZYRTEC) 10 MG tablet Take 1 tablet (10 mg total) by mouth daily. 07/11/21   Lincoln Brigham, PA-C  dronabinol (MARINOL) 2.5 MG capsule Take 1 capsule (2.5 mg total) by mouth 2 (two) times daily before a meal. 11/01/21   Brunetta Genera, MD  enoxaparin (LOVENOX) 80 MG/0.8ML injection Inject 0.8 mLs (80 mg total) into the skin every 12 (twelve) hours. 10/15/21 01/13/22  Mercy Riding, MD  hydrocortisone (ANUSOL-HC) 2.5 % rectal cream Place 1 Application rectally 2 (two) times daily. 09/13/21   Tawnya Crook, MD  LORazepam (ATIVAN) 0.5 MG tablet Take 1 tablet (0.5 mg total) by mouth every 8 (eight) hours as needed for anxiety or sleep. 11/01/21   Brunetta Genera, MD  losartan (COZAAR) 100 MG tablet Take 100 mg  by mouth daily. 12/16/21   [provider]  meloxicam (MOBIC) 15 MG tablet Take 1 tablet (15 mg total) by mouth daily. 12/16/21   [name], MD  Multiple Vitamin (MULTIVITAMIN WITH MINERALS) TABS tablet Take 1 tablet by mouth daily. 10/16/21   Mercy Riding, MD  omeprazole (PRILOSEC) 40 MG capsule Take 1 capsule (40 mg total) by mouth daily. 12/16/21   [name], MD  ondansetron (ZOFRAN) 8 MG tablet Take 1 tablet (8 mg total) by mouth 2 (two) times daily as needed (Nausea or vomiting). Patient taking differently: Take 8 mg by mouth 2 (two) times daily as needed for nausea or vomiting. 06/17/21   Brunetta Genera, MD  oxyCODONE (OXY IR/ROXICODONE) 5 MG immediate release tablet Take 1 tablet (5 mg total) by mouth every 4 (four) hours as needed for severe pain. 11/01/21   Brunetta Genera, MD  PRILOSEC OTC 20 MG tablet Take 20 mg by mouth See admin instructions. Take 20 mg by mouth in the morning before breakfast and an additional 20 mg at bedtime as needed for reflux    [provider]  prochlorperazine (COMPAZINE) 10 MG tablet Take 1 tablet (10 mg total) by mouth every 6 (six) hours as needed (Nausea or vomiting). 06/17/21   Brunetta Genera, MD  senna (SENOKOT) 8.6 MG TABS tablet  Take 1 tablet (8.6 mg total) by mouth 2 (two) times daily. 10/14/21   Dahal, Marlowe Aschoff, MD  Vitamin D, Ergocalciferol, (DRISDOL) 1.25 MG (50000 UNIT) CAPS capsule Take 1 capsule (50,000 Units total) by mouth every 7 (seven) days. Patient not taking: Reported on 12/16/2021 11/01/21   Brunetta Genera, MD      Allergies    Patient has no known allergies.    Review of Systems   Review of Systems  Gastrointestinal:  Positive for abdominal pain.    Physical Exam Updated Vital Signs BP (!) 144/82   Pulse 92   Temp (!) 97.4 F (36.3 C) (Oral)   Resp 18   Ht 6' (1.829 m)   Wt 73.5 kg   SpO2 96%   BMI 21.97 kg/m  Physical Exam Vitals and nursing note reviewed.  Constitutional:       Appearance: He is well-developed.  HENT:     Head: Normocephalic and atraumatic.  Eyes:     Pupils: Pupils are equal, round, and reactive to light.  Neck:     Vascular: No JVD.  Cardiovascular:     Rate and Rhythm: Normal rate and regular rhythm.     Heart sounds: No murmur heard.    No friction rub. No gallop.  Pulmonary:     Effort: No respiratory distress.     Breath sounds: No wheezing.  Abdominal:     General: There is no distension.     Tenderness: There is abdominal tenderness. There is no guarding or rebound.     Comments: Mild tenderness about the upper abdomen.  Very large palpable liver.  Musculoskeletal:        General: Normal range of motion.     Cervical back: Normal range of motion and neck supple.     Comments: Points the area along the right paraspinal musculature about T9-10.  Skin:    Coloration: Skin is not pale.     Findings: No rash.  Neurological:     Mental Status: He is alert and oriented to person, place, and time.  Psychiatric:        Behavior: Behavior normal.     ED Results / Procedures / Treatments   Labs (all labs ordered are listed, but only abnormal results are displayed) Labs Reviewed  CBC WITH DIFFERENTIAL/PLATELET - Abnormal; Notable for the following components:      Result Value   WBC 3.5 (*)    RBC 3.59 (*)    Hemoglobin 9.9 (*)    HCT 30.9 (*)    RDW 19.4 (*)    Lymphs Abs 0.5 (*)    All other components within normal limits  COMPREHENSIVE METABOLIC PANEL - Abnormal; Notable for the following components:   Chloride 95 (*)    Calcium 10.4 (*)    AST 305 (*)    ALT 609 (*)    Alkaline Phosphatase 204 (*)    Total Bilirubin 1.9 (*)    All other components within normal limits  LIPASE, BLOOD - Abnormal; Notable for the following components:   Lipase 159 (*)    All other components within normal limits  I-STAT CHEM 8, ED - Abnormal; Notable for the following components:   Chloride 94 (*)    Creatinine, Ser 0.50 (*)     Hemoglobin 12.2 (*)    HCT 36.0 (*)    All other components within normal limits  TROPONIN I (HIGH SENSITIVITY)    EKG None  Radiology CT ABDOMEN PELVIS W  CONTRAST  Result Date: 12/18/2021 CLINICAL DATA:  Acute nonlocalized abdominal pain. Nausea and vomiting. Back pain. Symptoms started 2 days ago. History of hepatocellular carcinoma. EXAM: CT ABDOMEN AND PELVIS WITH CONTRAST TECHNIQUE: Multidetector CT imaging of the abdomen and pelvis was performed using the standard protocol following bolus administration of intravenous contrast. RADIATION DOSE REDUCTION: This exam was performed according to the departmental dose-optimization program which includes automated exposure control, adjustment of the mA and/or kV according to patient size and/or use of iterative reconstruction technique. CONTRAST:  118m OMNIPAQUE IOHEXOL 350 MG/ML SOLN COMPARISON:  MRI abdomen 08/16/2021; CT abdomen 05/27/2021 FINDINGS: Lower chest: There is a new 10 mm medial left lower lobe nodule not seen on 02/24 2023 CT concerning for a pulmonary metastasis (axial series 4, image 34). There is mildly thick 3214 mm transverse dimension platelike atelectasis within the partially visualized middle lobe. More mild bilateral lower lobe curvilinear subsegmental atelectasis. No pleural effusion. Hepatobiliary: There are again numerous liver masses. These are again markedly increased from 05/24/2021 prior CT. They are also mildly increased from 08/16/2021 MRI. Comparison with the current CT is made with MRI 08/16/2021 series 24. Anterior superior hepatic dome lesion measuring up to 8.9 cm is increased from 7 cm previously (axial series 2, image 11). Conglomeration of more inferior right hepatic lobe masses measure up to approximately 18 cm in AP dimension compared to 16 cm previously (axial series 2, image 29). Inferior left hepatic lobe conglomeration of masses measuring up to 8.2 cm (axial image 45) is increased from 4.2 cm on 08/16/2021.  The gallbladder is mildly distended. No intrahepatic biliary ductal dilatation is seen. Pancreas: Unremarkable. No pancreatic ductal dilatation or surrounding inflammatory changes. Spleen: Normal in size without focal abnormality. Adrenals/Urinary Tract: The adrenals are unremarkable. The medial right hepatic lobe hepatic masses again exert moderate mass-effect on the anterolateral aspect of the right kidney, similar to 08/16/2021 MRI. The kidneys enhance uniformly and are symmetric in size without hydronephrosis. Left lower pole 3 mm renal stone is unchanged from 05/24/2021. There is a fluid attenuation 10 mm cyst again seen within the posterior left upper pole (axial image 31). No follow-up imaging recommended. No focal urinary bladder wall thickening is seen. Stomach/Bowel: Moderate stool is seen throughout the colon. The terminal ileum is unremarkable. The appendix is not confidently identified. No dilated loops of bowel are seen to indicate bowel obstruction. Vascular/Lymphatic: No abdominal aortic aneurysm is seen. The major intra-abdominal aortic branch vessels are patent. Moderate to high-grade atherosclerotic calcifications. No mesenteric, retroperitoneal, or pelvic pathologically enlarged lymph nodes are seen. 7 mm short axis gastrohepatic lymph node (axial series 2, image 17) is similar to 05/24/2021 CT. Reproductive: The prostate measures up to 5.1 cm in transverse dimension, mildly to moderately enlarged. The seminal vesicles are grossly unremarkable. Other: No ventral abdominal wall hernia. Tiny fat containing left inguinal hernia. No free air or free fluid within the abdomen or pelvis. Musculoskeletal: New partially visualized destructive lesion within the lateral right fifth rib. This was also seen its entirety on today's CT of the chest. Unchanged 7 mm lucent lesion within the anterior L1 vertebral body (sagittal series 6, image 61) compared to 05/24/2021. This is nonspecific. Severe bilateral  sacroiliac joint space narrowing is osseous fusion. IMPRESSION: Compared to 08/16/2021 MRI: 1. Interval increase in size of numerous hepatic masses consistent with hepatocellular carcinoma and metastases. The medial right hepatic lobe non rotation of masses again moderately impresses on the anterolateral aspect of the right kidney. No hydronephrosis. 2.  New partially visualized destructive lesion within the lateral right fifth rib concerning for new osseous metastatic disease. 3. New medial left lower lobe 10 mm pulmonary nodule compared to 05/24/2021 CT, suspicious for a new pulmonary metastasis. Electronically Signed   By: Yvonne Kendall M.D.   On: 12/18/2021 11:38   CT Angio Chest PE W and/or Wo Contrast  Result Date: 12/18/2021 CLINICAL DATA:  Pulmonary embolism suspected, high probability. History of metastatic carcinoma. EXAM: CT ANGIOGRAPHY CHEST WITH CONTRAST TECHNIQUE: Multidetector CT imaging of the chest was performed using the standard protocol during bolus administration of intravenous contrast. Multiplanar CT image reconstructions and MIPs were obtained to evaluate the vascular anatomy. RADIATION DOSE REDUCTION: This exam was performed according to the departmental dose-optimization program which includes automated exposure control, adjustment of the mA and/or kV according to patient size and/or use of iterative reconstruction technique. CONTRAST:  177m OMNIPAQUE IOHEXOL 350 MG/ML SOLN COMPARISON:  Chest radiography 03/27/2021.  CT 02/05/2021 FINDINGS: Cardiovascular: Heart size upper limits of normal. Coronary artery calcification. Aortic atherosclerotic calcification. Pulmonary arterial opacification is good. There are no pulmonary emboli. Mediastinum/Nodes: No mediastinal or hilar mass or lymphadenopathy. Lungs/Pleura: No pleural effusion. Mild scarring in both lower lungs. Upper Abdomen: See results of abdominal CT. Musculoskeletal: Expansile lytic lesion of the right fifth rib, likely due to  metastatic disease. Lytic metastasis affecting the T5 vertebral body with pathologic fracture. Suggest MRI to assess canal involvement. Probable lytic metastatic disease also affecting the T12 vertebral body but without fracture. Review of the MIP images confirms the above findings. IMPRESSION: No pulmonary emboli. Lytic destructive metastasis of the T5 vertebral body with pathologic fracture. Consider thoracic MRI to assess for canal encroachment. Expansile lytic lesion of the right anterolateral fifth rib. Aortic atherosclerosis.  Coronary artery calcification. Extensive malignancy affecting the liver. See results of abdominal CT. Electronically Signed   By: MNelson ChimesM.D.   On: 12/18/2021 11:05    Procedures Procedures    Medications Ordered in ED Medications  sodium chloride 0.9 % bolus 1,000 mL (1,000 mLs Intravenous New Bag/Given 12/18/21 0952)  HYDROmorphone (DILAUDID) injection 1 mg (1 mg Intravenous Given 12/18/21 0953)  metoCLOPramide (REGLAN) injection 10 mg (10 mg Intravenous Given 12/18/21 0956)  diphenhydrAMINE (BENADRYL) injection 25 mg (25 mg Intravenous Given 12/18/21 0954)  iohexol (OMNIPAQUE) 350 MG/ML injection 100 mL (100 mLs Intravenous Contrast Given 12/18/21 1043)  sodium chloride (PF) 0.9 % injection (  Given by Other 12/18/21 1100)    ED Course/ Medical Decision Making/ A&P                           Medical Decision Making Amount and/or Complexity of Data Reviewed Labs: ordered. Radiology: ordered.  Risk Prescription drug management.   71yo M with a significant past medical history of hepatocellular carcinoma on hospice with a chief complaints of right upper back pain.  Has been worsening recently.  He was started on some new medications by his family doctor.  Suffered some sleepiness and then nausea and vomiting.  Likely medication reaction.  The symptoms are most likely due to his underlying cancer.  Patient is concerned and describing some severe pain.  After  different possible treatment methods were described electing for CT imaging of the chest abdomen pelvis.  Patient is lipase is mildly elevated.  LFTs trending upward from last check.  CT scan of the chest abdomen pelvis with extension of his cancer.  Due to left lung  nodule new right rib metastasis.  New fracture due to the cancer to T5.  He has no radicular symptoms.  No appreciable weakness.  I discussed results with the patient and family.  We will have them discuss with their oncologist and pain management physician.  12:50 PM:  I have discussed the diagnosis/risks/treatment options with the patient and family.  Evaluation and diagnostic testing in the emergency department does not suggest an emergent condition requiring admission or immediate intervention beyond what has been performed at this time.  They will follow up with  PCP, oncology, pain management. We also discussed returning to the ED immediately if new or worsening sx occur. We discussed the sx which are most concerning (e.g., sudden worsening pain, fever, inability to tolerate by mouth) that necessitate immediate return. Medications administered to the patient during their visit and any new prescriptions provided to the patient are listed below.  Medications given during this visit Medications  sodium chloride 0.9 % bolus 1,000 mL (1,000 mLs Intravenous New Bag/Given 12/18/21 0952)  HYDROmorphone (DILAUDID) injection 1 mg (1 mg Intravenous Given 12/18/21 0953)  metoCLOPramide (REGLAN) injection 10 mg (10 mg Intravenous Given 12/18/21 0956)  diphenhydrAMINE (BENADRYL) injection 25 mg (25 mg Intravenous Given 12/18/21 0954)  iohexol (OMNIPAQUE) 350 MG/ML injection 100 mL (100 mLs Intravenous Contrast Given 12/18/21 1043)  sodium chloride (PF) 0.9 % injection (  Given by Other 12/18/21 1100)     The patient appears reasonably screen and/or stabilized for discharge and I doubt any other medical condition or other [facility] requiring further  screening, evaluation, or treatment in the ED at this time prior to discharge.          Final Clinical Impression(s) / ED Diagnoses Final diagnoses:  Closed fracture of fifth thoracic vertebra, unspecified fracture morphology, initial encounter [facility])  Hepatocellular carcinoma [facility])    Rx / DC Orders ED Discharge Orders     None         Deno Etienne, DO 12/18/21 1250

## 2021-12-18 NOTE — ED Triage Notes (Signed)
Pt arrived via POV, c/o abd pain, nausea, vomiting, back pain. States sx started when he started new medications Monday. Hx of CA, not currently undergoing any tx.

## 2021-12-18 NOTE — Discharge Instructions (Signed)
The CT imaging today shows that your cancer has spread.  Please call your oncologist and let them know so they can decide what makes sense going forward.  Please let your pain management doctor know that you are having some worsening pain and see what they can do to help.  Please let the hospice folks know as well.  Please return to the emergency department for worsening pain fever.

## 2021-12-29 DEATH — deceased

## 2022-08-29 IMAGING — MR MR ABDOMEN WO/W CM
20 series · 48 of 48 positions shown · IV contrast (8ML GADAVIST)
Comparison: 05/27/2021 CT.

CLINICAL DATA: Status post [AGE] treatment and immunotherapy for
hepatocellular carcinoma.

EXAM:
MRI ABDOMEN WITHOUT AND WITH CONTRAST
TECHNIQUE: Multiplanar multisequence MR imaging of the abdomen was performed
both before and after the administration of intravenous contrast.
CONTRAST:  8mL GADAVIST GADOBUTROL 1 MMOL/ML IV SOLN

[Series 2: DWI · axial · 6.0mm · 1.49mm/px · z∈[-169,+112]mm · 2 of 80 slices shown (1 of 2)]
[im 1/80]
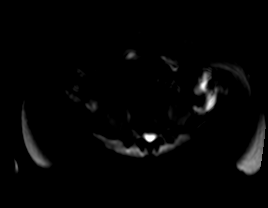
[im 80/80]
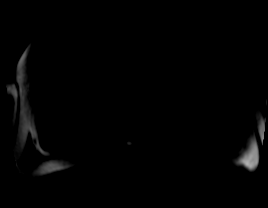

[Series 3: DWI · axial · 6.0mm · 1.49mm/px · 1 of 40 slices shown (2 of 2)]
[im 1/40]
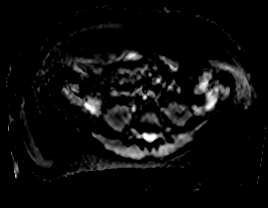

[Series 4: T2 fat-sat · axial · 6.0mm · 1.25mm/px · 1 of 40 slices shown]
[im 1/40]
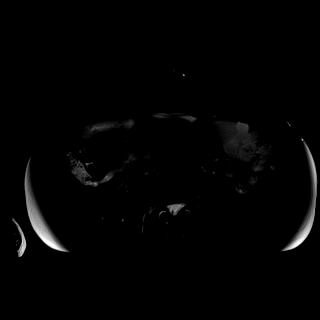

[Series 7: T2 · coronal · 6.0mm · 1.56mm/px · 1 of 36 slices shown (1 of 2)]
[im 1/36]
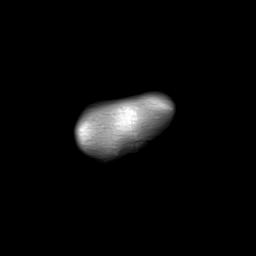

[Series 8: T1 · axial · 3.0mm · 1.25mm/px · z∈[-149,+88]mm · 2 of 80 slices shown (1 of 2)]
[im 1/80]
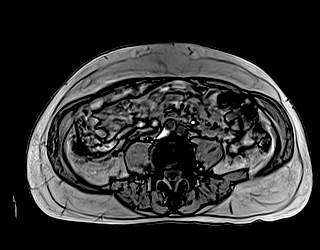
[im 80/80]
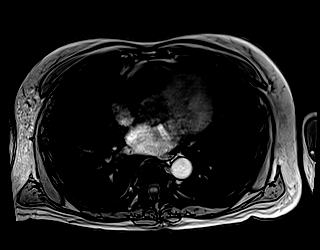

[Series 9: T1 · axial · 3.0mm · 1.25mm/px · z∈[-149,+88]mm · 3 of 80 slices shown (2 of 2)]
[im 1/80]
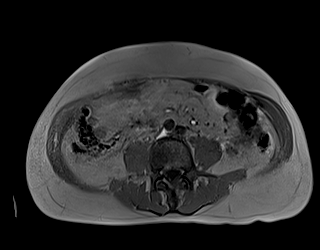
[im 40/80]
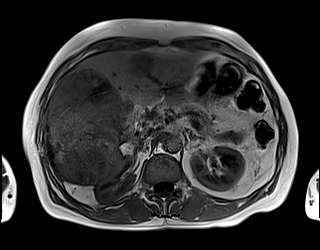
[im 80/80]
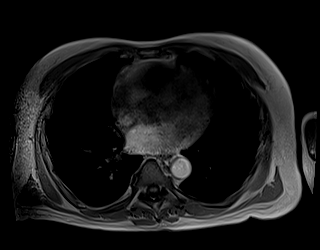

[Series 10: bSSFP · axial · 4.0mm · 0.78mm/px · z∈[-155,+93]mm · 2 of 63 slices shown]
[im 1/63]
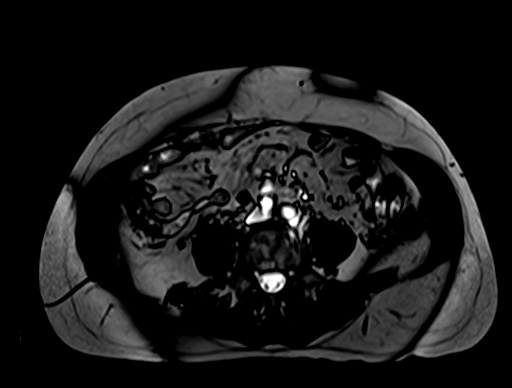
[im 63/63]
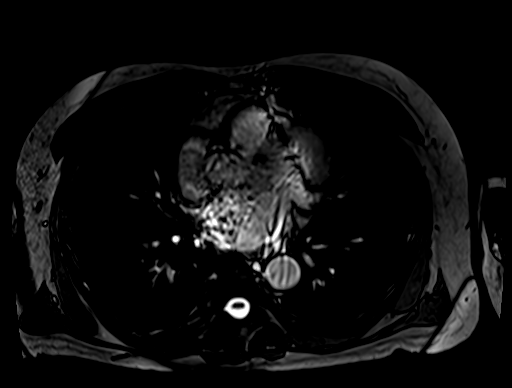

[Series 12: T1 dynamic · axial · 3.0mm · 1.25mm/px · z∈[-149,+88]mm · 3 of 80 slices shown (1 of 12)]
[im 1/80]
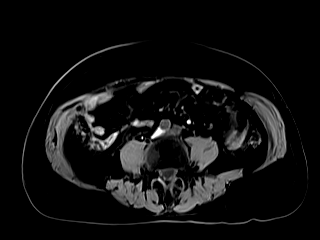
[im 40/80]
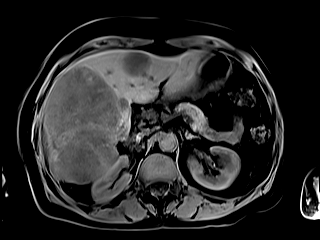
[im 80/80]
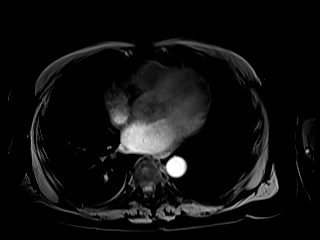

[Series 16: T1 dynamic · axial · 3.0mm · 1.25mm/px · z∈[-149,+88]mm · 3 of 80 slices shown (2 of 12)]
[im 1/80]
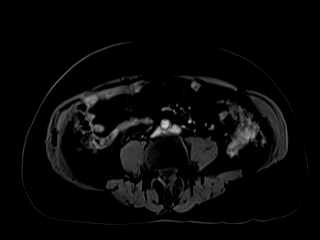
[im 40/80]
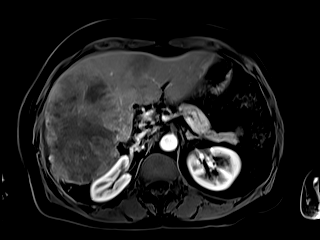
[im 80/80]
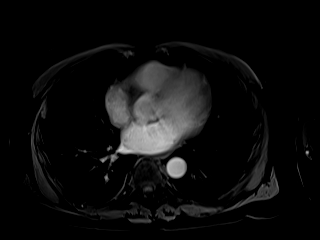

[Series 17: T1 dynamic · axial · 3.0mm · 1.25mm/px · z∈[-149,+88]mm · 3 of 80 slices shown (3 of 12)]
[im 1/80]
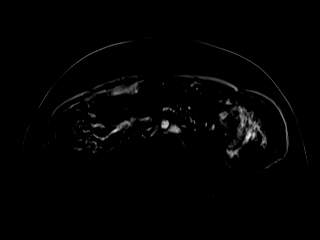
[im 40/80]
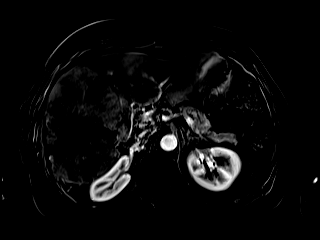
[im 80/80]
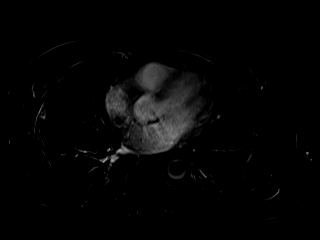

[Series 20: T1 dynamic · axial · 3.0mm · 1.25mm/px · z∈[-149,+88]mm · 3 of 80 slices shown (4 of 12)]
[im 1/80]
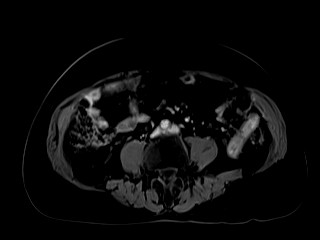
[im 40/80]
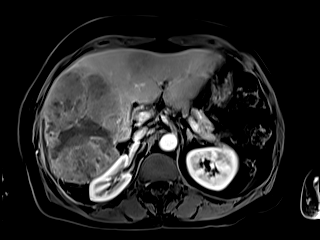
[im 80/80]
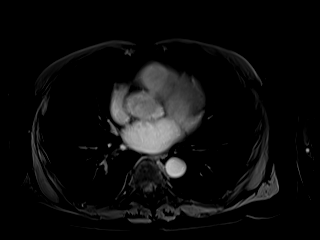

[Series 21: T1 dynamic · axial · 3.0mm · 1.25mm/px · z∈[-149,+88]mm · 3 of 80 slices shown (5 of 12)]
[im 1/80]
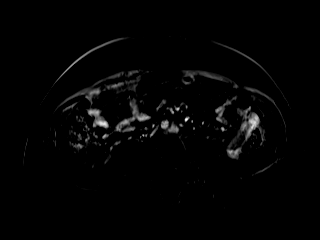
[im 40/80]
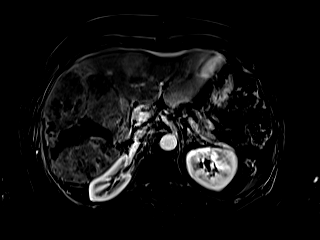
[im 80/80]
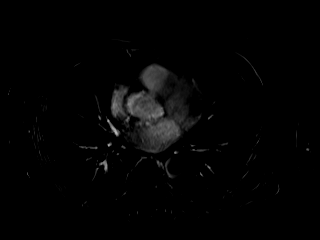

[Series 24: T1 dynamic · axial · 3.0mm · 1.25mm/px · z∈[-149,+88]mm · 3 of 80 slices shown (6 of 12)]
[im 1/80]
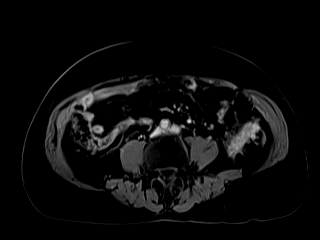
[im 40/80]
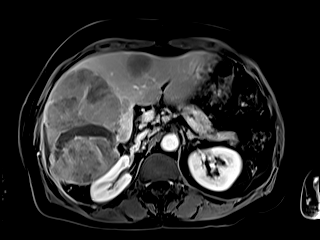
[im 80/80]
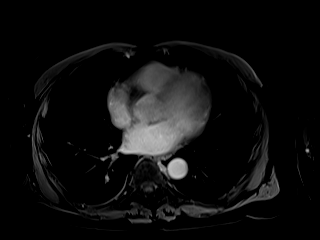

[Series 25: T1 dynamic · axial · 3.0mm · 1.25mm/px · z∈[-149,+88]mm · 3 of 80 slices shown (7 of 12)]
[im 1/80]
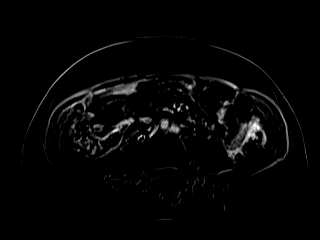
[im 40/80]
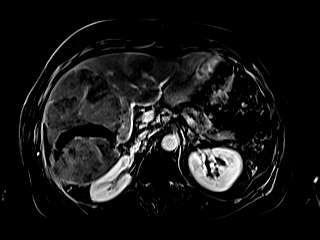
[im 80/80]
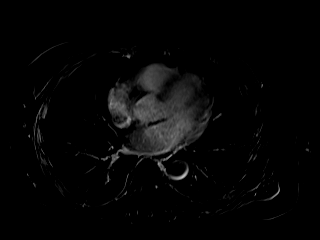

[Series 27: T1 dynamic · coronal · 5.0mm · 1.39mm/px · 2 of 56 slices shown (8 of 12)]
[im 1/56]
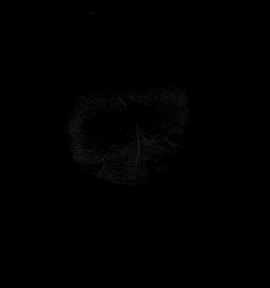
[im 56/56]
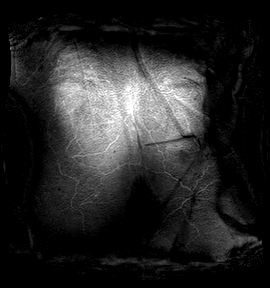

[Series 28: T2 · axial · 6.0mm · 1.56mm/px · 1 of 36 slices shown (2 of 2)]
[im 1/36]
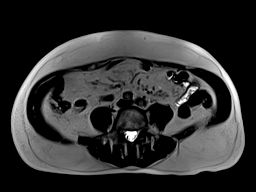

[Series 31: T1 dynamic · axial · 3.0mm · 1.25mm/px · z∈[-149,+88]mm · 3 of 80 slices shown (9 of 12)]
[im 1/80]
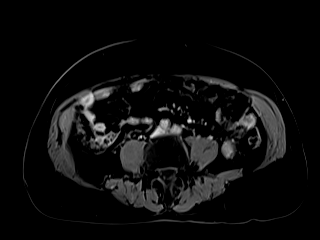
[im 40/80]
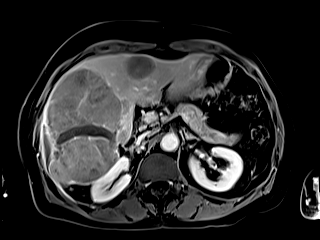
[im 80/80]
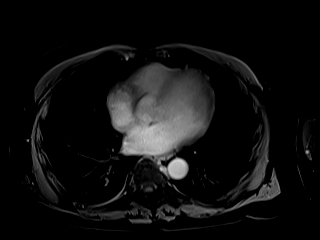

[Series 32: T1 dynamic · axial · 3.0mm · 1.25mm/px · z∈[-149,+88]mm · 3 of 80 slices shown (10 of 12)]
[im 1/80]
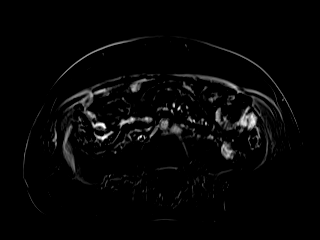
[im 40/80]
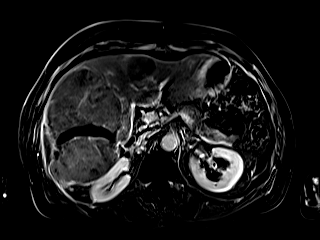
[im 80/80]
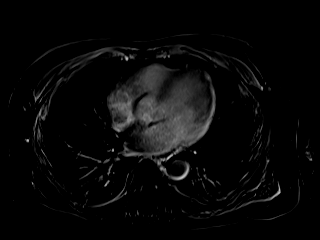

[Series 35: T1 dynamic · axial · 3.0mm · 1.25mm/px · z∈[-149,+88]mm · 3 of 80 slices shown (11 of 12)]
[im 1/80]
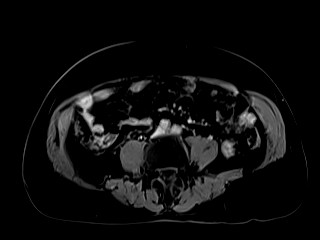
[im 40/80]
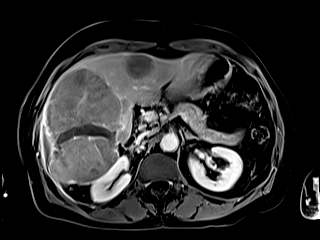
[im 80/80]
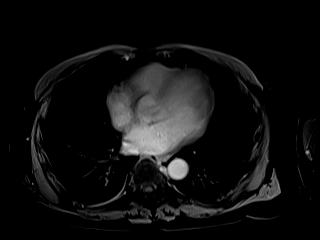

[Series 36: T1 dynamic · axial · 3.0mm · 1.25mm/px · z∈[-149,+88]mm · 3 of 80 slices shown (12 of 12)]
[im 1/80]
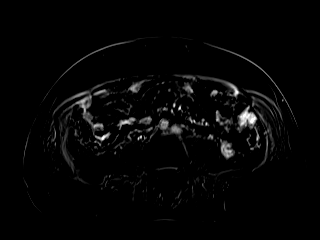
[im 40/80]
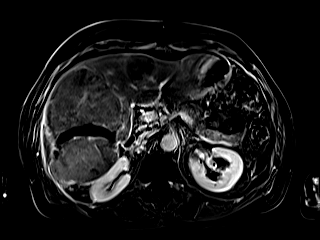
[im 80/80]
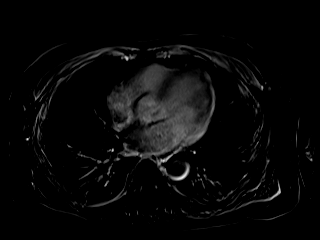

[48 of 48 positions shown; findings below may reference images not displayed]

FINDINGS: Lower chest: Mild cardiomegaly, without pericardial or pleural
effusion. Tiny hiatal hernia. Borderline right cardiophrenic angle
adenopathy at 8 mm is similar.

Hepatobiliary: Direct comparison of the extent of liver disease is
made on series 24 to the prior portal venous phase CT images. The
dominant right hepatic lobe mass measures 14.5 x 9.7 cm on 40/24
versus 13.0 x 7.9 cm when measured at the same level on the prior.
Persistent extension into the perihepatic space posteriorly and
laterally including on 49/24.

The right hepatic dome mass has significantly enlarged. 5.3 x 6.5 cm
on 17/24 today versus 3.1 x 3.0 cm on the prior exam (when
remeasured).

Multiple left-sided masses, with an index segment 4 B lesion
measuring 3.6 x 3.4 cm on 45/24 versus 1.9 x 1.8 cm on the prior
exam (when remeasured).

Normal gallbladder, without biliary ductal dilatation.

Pancreas:  Normal, without mass or ductal dilatation.

Spleen:  Normal in size, without focal abnormality.

Adrenals/Urinary Tract: Normal adrenal glands. Interpolar left renal
cyst. Normal right kidney. No hydronephrosis.

Stomach/Bowel: Normal remainder of the stomach. Colonic stool burden
suggests constipation. Normal small bowel.

Vascular/Lymphatic: Aortic atherosclerosis. Main portal and splenic
veins are patent. No abdominal adenopathy.

Other:  Trace perihepatic ascites.

Musculoskeletal: No acute osseous abnormality.
IMPRESSION: 1. Marked progression of tumor burden throughout both liver lobes as
detailed above.
2. No evidence of extrahepatic metastatic disease.
3.  Tiny hiatal hernia.
4.  Aortic Atherosclerosis ([id number]).
5. Trace perihepatic ascites.

## 2022-09-30 IMAGING — DX DG THORACIC SPINE 3V
2 series · 3 of 3 positions shown · non-contrast
Comparison: CT abdomen pelvis 05/24/2021

CLINICAL DATA: pain thoracic spine and some swelling. please place
marker

EXAM:
THORACIC SPINE - 3 VIEWS

[Series 1: t-spine ap · 0.14mm/px · 2 of 2 slices shown]
[im 1/2]
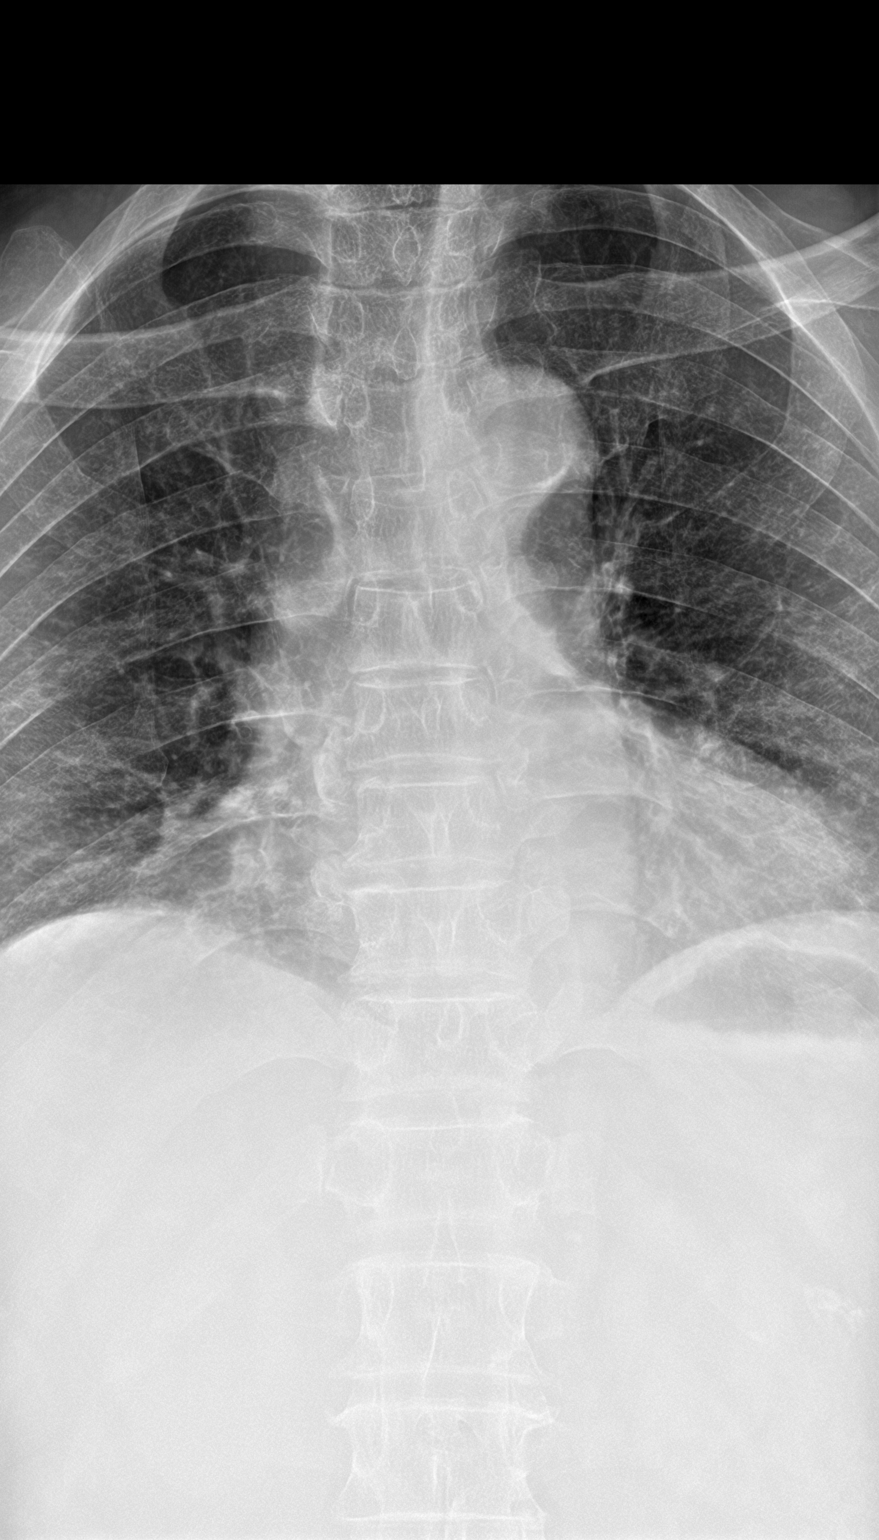
[im 2/2]
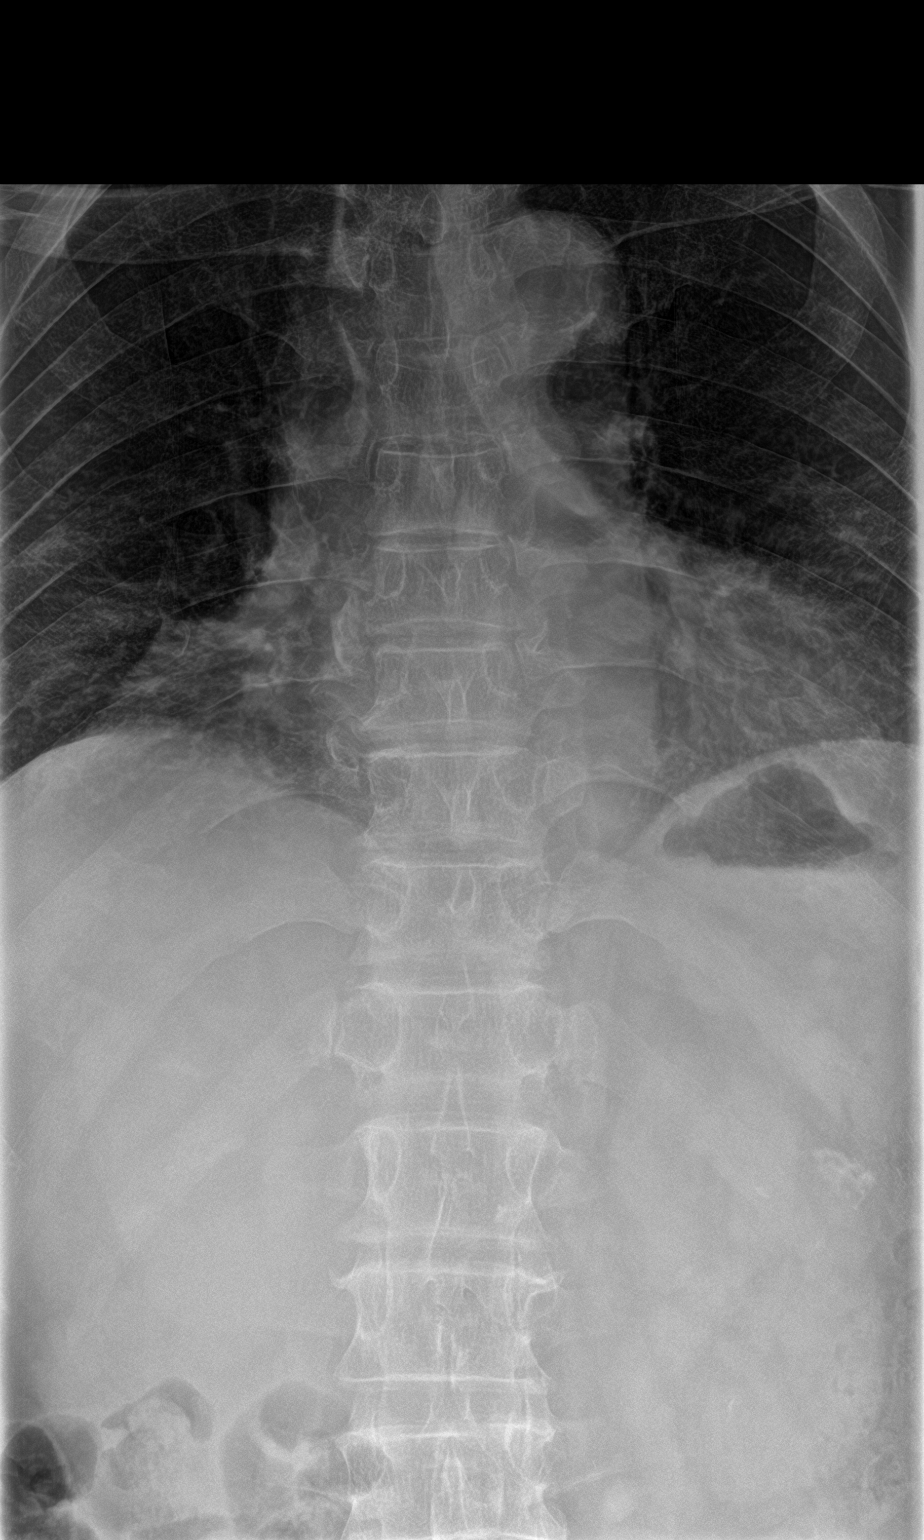

[t-spine lat]
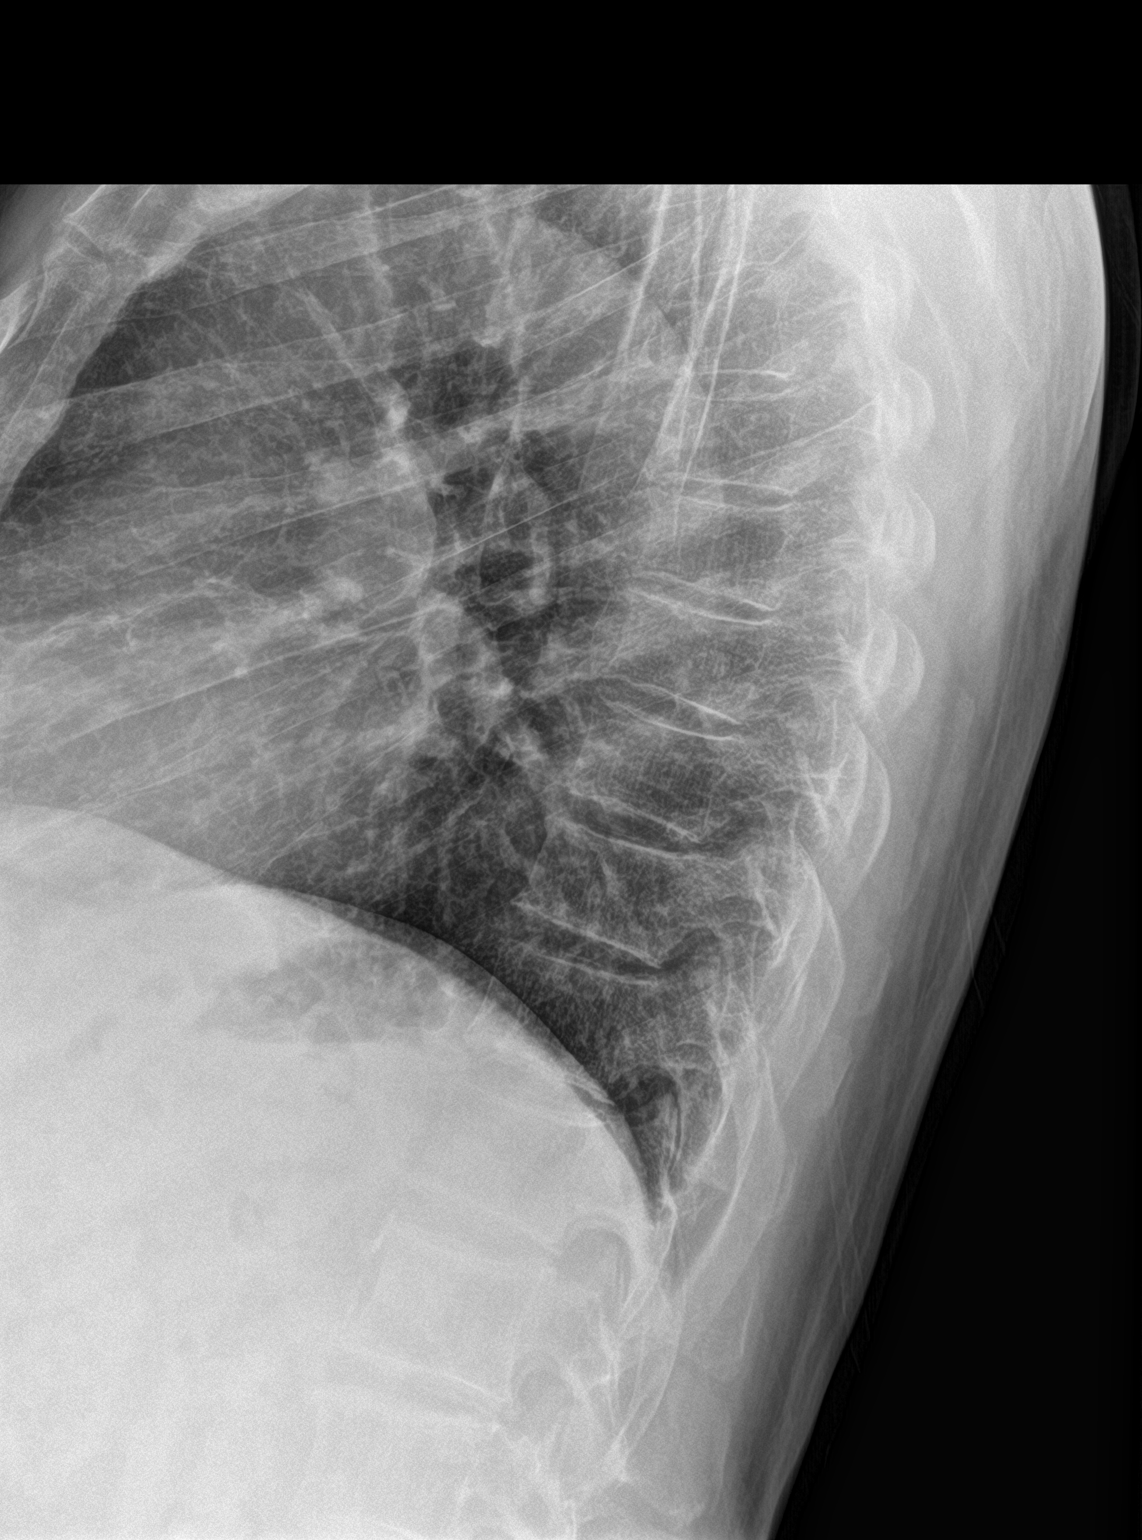

[3 of 3 positions shown; findings below may reference images not displayed]

FINDINGS: Limited evaluation due to overlapping osseous structures and
overlying soft tissues. There is no evidence of thoracic spine
fracture. Alignment is normal. No other significant bone
abnormalities are identified.

Calcifications overlying the left kidney suggesting nephrolithiasis.

Aortic calcification.
IMPRESSION: 1. No acute displaced fracture or traumatic listhesis of the
thoracic spine.
2.  Aortic Atherosclerosis ([id number]).

## 2023-04-30 ENCOUNTER — Other Ambulatory Visit (HOSPITAL_BASED_OUTPATIENT_CLINIC_OR_DEPARTMENT_OTHER): Payer: Self-pay
